# Patient Record
Sex: Female | Born: 1955
Health system: Southern US, Community
[De-identification: ages and names within clinical notes are randomized; demographics above are authoritative.]

## PROBLEM LIST (undated history)

## (undated) DIAGNOSIS — M1990 Unspecified osteoarthritis, unspecified site: Secondary | ICD-10-CM

## (undated) DIAGNOSIS — R12 Heartburn: Secondary | ICD-10-CM

## (undated) DIAGNOSIS — Z9889 Other specified postprocedural states: Secondary | ICD-10-CM

## (undated) DIAGNOSIS — G43909 Migraine, unspecified, not intractable, without status migrainosus: Secondary | ICD-10-CM

## (undated) DIAGNOSIS — F419 Anxiety disorder, unspecified: Secondary | ICD-10-CM

## (undated) DIAGNOSIS — N200 Calculus of kidney: Secondary | ICD-10-CM

## (undated) DIAGNOSIS — K449 Diaphragmatic hernia without obstruction or gangrene: Secondary | ICD-10-CM

## (undated) DIAGNOSIS — N390 Urinary tract infection, site not specified: Secondary | ICD-10-CM

## (undated) DIAGNOSIS — C4491 Basal cell carcinoma of skin, unspecified: Secondary | ICD-10-CM

## (undated) DIAGNOSIS — K5790 Diverticulosis of intestine, part unspecified, without perforation or abscess without bleeding: Secondary | ICD-10-CM

## (undated) DIAGNOSIS — R112 Nausea with vomiting, unspecified: Secondary | ICD-10-CM

## (undated) DIAGNOSIS — Z8619 Personal history of other infectious and parasitic diseases: Secondary | ICD-10-CM

## (undated) DIAGNOSIS — E7800 Pure hypercholesterolemia, unspecified: Secondary | ICD-10-CM

## (undated) DIAGNOSIS — R7303 Prediabetes: Secondary | ICD-10-CM

## (undated) DIAGNOSIS — K219 Gastro-esophageal reflux disease without esophagitis: Secondary | ICD-10-CM

## (undated) DIAGNOSIS — G4700 Insomnia, unspecified: Secondary | ICD-10-CM

## (undated) HISTORY — DX: Personal history of other infectious and parasitic diseases: Z86.19

## (undated) HISTORY — DX: Unspecified osteoarthritis, unspecified site: M19.90

## (undated) HISTORY — DX: Prediabetes: R73.03

## (undated) HISTORY — DX: Gastro-esophageal reflux disease without esophagitis: K21.9

## (undated) HISTORY — DX: Anxiety disorder, unspecified: F41.9

## (undated) HISTORY — PX: CHOLECYSTECTOMY: SHX55

## (undated) HISTORY — DX: Basal cell carcinoma of skin, unspecified: C44.91

## (undated) HISTORY — DX: Migraine, unspecified, not intractable, without status migrainosus: G43.909

## (undated) HISTORY — DX: Pure hypercholesterolemia, unspecified: E78.00

## (undated) HISTORY — DX: Diverticulosis of intestine, part unspecified, without perforation or abscess without bleeding: K57.90

## (undated) HISTORY — DX: Urinary tract infection, site not specified: N39.0

## (undated) HISTORY — DX: Insomnia, unspecified: G47.00

## (undated) HISTORY — DX: Heartburn: R12

## (undated) HISTORY — PX: BASAL CELL CARCINOMA EXCISION: SHX1214

## (undated) HISTORY — DX: Diaphragmatic hernia without obstruction or gangrene: K44.9

## (undated) HISTORY — DX: Calculus of kidney: N20.0

---

## 1986-08-17 HISTORY — PX: ABDOMINAL HYSTERECTOMY: SHX81

## 1986-08-17 HISTORY — PX: APPENDECTOMY: SHX54

## 1988-08-17 HISTORY — PX: BREAST BIOPSY: SHX20

## 1998-08-17 HISTORY — PX: BLADDER REPAIR: SHX76

## 2000-08-03 ENCOUNTER — Ambulatory Visit (HOSPITAL_COMMUNITY): Admission: RE | Admit: 2000-08-03 | Discharge: 2000-08-03 | Payer: Self-pay | Admitting: Gastroenterology

## 2000-08-03 ENCOUNTER — Encounter: Payer: Self-pay | Admitting: Gastroenterology

## 2000-10-13 ENCOUNTER — Ambulatory Visit (HOSPITAL_COMMUNITY): Admission: RE | Admit: 2000-10-13 | Discharge: 2000-10-13 | Payer: Self-pay | Admitting: Gastroenterology

## 2000-10-13 ENCOUNTER — Encounter: Payer: Self-pay | Admitting: Gastroenterology

## 2000-11-04 ENCOUNTER — Ambulatory Visit (HOSPITAL_COMMUNITY): Admission: RE | Admit: 2000-11-04 | Discharge: 2000-11-04 | Payer: Self-pay | Admitting: Gastroenterology

## 2001-02-16 ENCOUNTER — Other Ambulatory Visit: Admission: RE | Admit: 2001-02-16 | Discharge: 2001-02-16 | Payer: Self-pay | Admitting: Obstetrics & Gynecology

## 2005-04-07 ENCOUNTER — Encounter: Admission: RE | Admit: 2005-04-07 | Discharge: 2005-04-07 | Payer: Self-pay | Admitting: Orthopaedic Surgery

## 2005-08-21 ENCOUNTER — Encounter: Admission: RE | Admit: 2005-08-21 | Discharge: 2005-08-21 | Payer: Self-pay | Admitting: Unknown Physician Specialty

## 2014-03-17 LAB — HM MAMMOGRAPHY

## 2014-09-10 ENCOUNTER — Ambulatory Visit: Payer: Self-pay | Admitting: Physician Assistant

## 2014-09-17 ENCOUNTER — Ambulatory Visit (INDEPENDENT_AMBULATORY_CARE_PROVIDER_SITE_OTHER): Payer: No Typology Code available for payment source | Admitting: Physician Assistant

## 2014-09-17 ENCOUNTER — Encounter: Payer: Self-pay | Admitting: Physician Assistant

## 2014-09-17 VITALS — BP 111/76 | HR 75 | Temp 98.2°F | Resp 16 | Ht 63.0 in | Wt 155.5 lb

## 2014-09-17 DIAGNOSIS — G43009 Migraine without aura, not intractable, without status migrainosus: Secondary | ICD-10-CM | POA: Insufficient documentation

## 2014-09-17 DIAGNOSIS — K219 Gastro-esophageal reflux disease without esophagitis: Secondary | ICD-10-CM

## 2014-09-17 DIAGNOSIS — Z23 Encounter for immunization: Secondary | ICD-10-CM

## 2014-09-17 DIAGNOSIS — G4700 Insomnia, unspecified: Secondary | ICD-10-CM | POA: Insufficient documentation

## 2014-09-17 DIAGNOSIS — L500 Allergic urticaria: Secondary | ICD-10-CM

## 2014-09-17 DIAGNOSIS — G479 Sleep disorder, unspecified: Secondary | ICD-10-CM

## 2014-09-17 DIAGNOSIS — Z8669 Personal history of other diseases of the nervous system and sense organs: Secondary | ICD-10-CM | POA: Diagnosis not present

## 2014-09-17 DIAGNOSIS — Z85828 Personal history of other malignant neoplasm of skin: Secondary | ICD-10-CM | POA: Diagnosis not present

## 2014-09-17 MED ORDER — ALPRAZOLAM 0.25 MG PO TABS
0.2500 mg | ORAL_TABLET | Freq: Every evening | ORAL | Status: DC | PRN
Start: 2014-09-17 — End: 2015-03-27

## 2014-09-17 MED ORDER — CHOLECALCIFEROL 10 MCG (400 UNIT) PO TABS
400.0000 [IU] | ORAL_TABLET | Freq: Every day | ORAL | Status: DC
Start: 2014-09-17 — End: 2021-06-20

## 2014-09-17 MED ORDER — SUMATRIPTAN SUCCINATE 100 MG PO TABS: 100.0000 mg | ORAL_TABLET | Freq: Once | ORAL | Status: DC

## 2014-09-17 MED ORDER — RANITIDINE HCL 150 MG PO CAPS: 150.0000 mg | ORAL_CAPSULE | Freq: Every day | ORAL | Status: DC | PRN

## 2014-09-17 NOTE — Progress Notes (Signed)
Pre visit review using our clinic review tool, if applicable. No additional management support is needed unless otherwise documented below in the visit note/SLS  

## 2014-09-17 NOTE — Patient Instructions (Signed)
Please schedule an appointment for a Complete Physical with fasting labs.  Please continue your medications as directed.  Read information below on Preventive Care for adults.  Preventive Care for Adults A healthy lifestyle and preventive care can promote health and wellness. Preventive health guidelines for women include the following key practices.  A routine yearly physical is a good way to check with your health care provider about your health and preventive screening. It is a chance to share any concerns and updates on your health and to receive a thorough exam.  Visit your dentist for a routine exam and preventive care every 6 months. Brush your teeth twice a day and floss once a day. Good oral hygiene prevents tooth decay and gum disease.  The frequency of eye exams is based on your age, health, family medical history, use of contact lenses, and other factors. Follow your health care provider's recommendations for frequency of eye exams.  Eat a healthy diet. Foods like vegetables, fruits, whole grains, low-fat dairy products, and lean protein foods contain the nutrients you need without too many calories. Decrease your intake of foods high in solid fats, added sugars, and salt. Eat the right amount of calories for you.Get information about a proper diet from your health care provider, if necessary.  Regular physical exercise is one of the most important things you can do for your health. Most adults should get at least 150 minutes of moderate-intensity exercise (any activity that increases your heart rate and causes you to sweat) each week. In addition, most adults need muscle-strengthening exercises on 2 or more days a week.  Maintain a healthy weight. The body mass index (BMI) is a screening tool to identify possible weight problems. It provides an estimate of body fat based on height and weight. Your health care provider can find your BMI and can help you achieve or maintain a healthy  weight.For adults 20 years and older:  A BMI below 18.5 is considered underweight.  A BMI of 18.5 to 24.9 is normal.  A BMI of 25 to 29.9 is considered overweight.  A BMI of 30 and above is considered obese.  Maintain normal blood lipids and cholesterol levels by exercising and minimizing your intake of saturated fat. Eat a balanced diet with plenty of fruit and vegetables. Blood tests for lipids and cholesterol should begin at age 52 and be repeated every 5 years. If your lipid or cholesterol levels are high, you are over 50, or you are at high risk for heart disease, you may need your cholesterol levels checked more frequently.Ongoing high lipid and cholesterol levels should be treated with medicines if diet and exercise are not working.  If you smoke, find out from your health care provider how to quit. If you do not use tobacco, do not start.  Lung cancer screening is recommended for adults aged 37-80 years who are at high risk for developing lung cancer because of a history of smoking. A yearly low-dose CT scan of the lungs is recommended for people who have at least a 30-pack-year history of smoking and are a current smoker or have quit within the past 15 years. A pack year of smoking is smoking an average of 1 pack of cigarettes a day for 1 year (for example: 1 pack a day for 30 years or 2 packs a day for 15 years). Yearly screening should continue until the smoker has stopped smoking for at least 15 years. Yearly screening should be stopped for  people who develop a health problem that would prevent them from having lung cancer treatment.  If you are pregnant, do not drink alcohol. If you are breastfeeding, be very cautious about drinking alcohol. If you are not pregnant and choose to drink alcohol, do not have more than 1 drink per day. One drink is considered to be 12 ounces (355 mL) of beer, 5 ounces (148 mL) of wine, or 1.5 ounces (44 mL) of liquor.  Avoid use of street drugs. Do not  share needles with anyone. Ask for help if you need support or instructions about stopping the use of drugs.  High blood pressure causes heart disease and increases the risk of stroke. Your blood pressure should be checked at least every 1 to 2 years. Ongoing high blood pressure should be treated with medicines if weight loss and exercise do not work.  If you are 24-82 years old, ask your health care provider if you should take aspirin to prevent strokes.  Diabetes screening involves taking a blood sample to check your fasting blood sugar level. This should be done once every 3 years, after age 33, if you are within normal weight and without risk factors for diabetes. Testing should be considered at a younger age or be carried out more frequently if you are overweight and have at least 1 risk factor for diabetes.  Breast cancer screening is essential preventive care for women. You should practice "breast self-awareness." This means understanding the normal appearance and feel of your breasts and may include breast self-examination. Any changes detected, no matter how small, should be reported to a health care provider. Women in their 21s and 30s should have a clinical breast exam (CBE) by a health care provider as part of a regular health exam every 1 to 3 years. After age 96, women should have a CBE every year. Starting at age 52, women should consider having a mammogram (breast X-ray test) every year. Women who have a family history of breast cancer should talk to their health care provider about genetic screening. Women at a high risk of breast cancer should talk to their health care providers about having an MRI and a mammogram every year.  Breast cancer gene (BRCA)-related cancer risk assessment is recommended for women who have family members with BRCA-related cancers. BRCA-related cancers include breast, ovarian, tubal, and peritoneal cancers. Having family members with these cancers may be  associated with an increased risk for harmful changes (mutations) in the breast cancer genes BRCA1 and BRCA2. Results of the assessment will determine the need for genetic counseling and BRCA1 and BRCA2 testing.  Routine pelvic exams to screen for cancer are no longer recommended for nonpregnant women who are considered low risk for cancer of the pelvic organs (ovaries, uterus, and vagina) and who do not have symptoms. Ask your health care provider if a screening pelvic exam is right for you.  If you have had past treatment for cervical cancer or a condition that could lead to cancer, you need Pap tests and screening for cancer for at least 20 years after your treatment. If Pap tests have been discontinued, your risk factors (such as having a new sexual partner) need to be reassessed to determine if screening should be resumed. Some women have medical problems that increase the chance of getting cervical cancer. In these cases, your health care provider may recommend more frequent screening and Pap tests.  The HPV test is an additional test that may be used for  cervical cancer screening. The HPV test looks for the virus that can cause the cell changes on the cervix. The cells collected during the Pap test can be tested for HPV. The HPV test could be used to screen women aged 74 years and older, and should be used in women of any age who have unclear Pap test results. After the age of 92, women should have HPV testing at the same frequency as a Pap test.  Colorectal cancer can be detected and often prevented. Most routine colorectal cancer screening begins at the age of 50 years and continues through age 67 years. However, your health care provider may recommend screening at an earlier age if you have risk factors for colon cancer. On a yearly basis, your health care provider may provide home test kits to check for hidden blood in the stool. Use of a small camera at the end of a tube, to directly examine the  colon (sigmoidoscopy or colonoscopy), can detect the earliest forms of colorectal cancer. Talk to your health care provider about this at age 78, when routine screening begins. Direct exam of the colon should be repeated every 5-10 years through age 30 years, unless early forms of pre-cancerous polyps or small growths are found.  People who are at an increased risk for hepatitis B should be screened for this virus. You are considered at high risk for hepatitis B if:  You were born in a country where hepatitis B occurs often. Talk with your health care provider about which countries are considered high risk.  Your parents were born in a high-risk country and you have not received a shot to protect against hepatitis B (hepatitis B vaccine).  You have HIV or AIDS.  You use needles to inject street drugs.  You live with, or have sex with, someone who has hepatitis B.  You get hemodialysis treatment.  You take certain medicines for conditions like cancer, organ transplantation, and autoimmune conditions.  Hepatitis C blood testing is recommended for all people born from [birth year] through 1965 and any individual with known risks for hepatitis C.  Practice safe sex. Use condoms and avoid high-risk sexual practices to reduce the spread of sexually transmitted infections (STIs). STIs include gonorrhea, chlamydia, syphilis, trichomonas, herpes, HPV, and human immunodeficiency virus (HIV). Herpes, HIV, and HPV are viral illnesses that have no cure. They can result in disability, cancer, and death.  You should be screened for sexually transmitted illnesses (STIs) including gonorrhea and chlamydia if:  You are sexually active and are younger than 24 years.  You are older than 24 years and your health care provider tells you that you are at risk for this type of infection.  Your sexual activity has changed since you were last screened and you are at an increased risk for chlamydia or gonorrhea. Ask your  health care provider if you are at risk.  If you are at risk of being infected with HIV, it is recommended that you take a prescription medicine daily to prevent HIV infection. This is called preexposure prophylaxis (PrEP). You are considered at risk if:  You are a heterosexual woman, are sexually active, and are at increased risk for HIV infection.  You take drugs by injection.  You are sexually active with a partner who has HIV.  Talk with your health care provider about whether you are at high risk of being infected with HIV. If you choose to begin PrEP, you should first be tested for HIV. You  should then be tested every 3 months for as long as you are taking PrEP.  Osteoporosis is a disease in which the bones lose minerals and strength with aging. This can result in serious bone fractures or breaks. The risk of osteoporosis can be identified using a bone density scan. Women ages 43 years and over and women at risk for fractures or osteoporosis should discuss screening with their health care providers. Ask your health care provider whether you should take a calcium supplement or vitamin D to reduce the rate of osteoporosis.  Menopause can be associated with physical symptoms and risks. Hormone replacement therapy is available to decrease symptoms and risks. You should talk to your health care provider about whether hormone replacement therapy is right for you.  Use sunscreen. Apply sunscreen liberally and repeatedly throughout the day. You should seek shade when your shadow is shorter than you. Protect yourself by wearing long sleeves, pants, a wide-brimmed hat, and sunglasses year round, whenever you are outdoors.  Once a month, do a whole body skin exam, using a mirror to look at the skin on your back. Tell your health care provider of new moles, moles that have irregular borders, moles that are larger than a pencil eraser, or moles that have changed in shape or color.  Stay current with  required vaccines (immunizations).  Influenza vaccine. All adults should be immunized every year.  Tetanus, diphtheria, and acellular pertussis (Td, Tdap) vaccine. Pregnant women should receive 1 dose of Tdap vaccine during each pregnancy. The dose should be obtained regardless of the length of time since the last dose. Immunization is preferred during the 27th-36th week of gestation. An adult who has not previously received Tdap or who does not know her vaccine status should receive 1 dose of Tdap. This initial dose should be followed by tetanus and diphtheria toxoids (Td) booster doses every 10 years. Adults with an unknown or incomplete history of completing a 3-dose immunization series with Td-containing vaccines should begin or complete a primary immunization series including a Tdap dose. Adults should receive a Td booster every 10 years.  Varicella vaccine. An adult without evidence of immunity to varicella should receive 2 doses or a second dose if she has previously received 1 dose. Pregnant females who do not have evidence of immunity should receive the first dose after pregnancy. This first dose should be obtained before leaving the health care facility. The second dose should be obtained 4-8 weeks after the first dose.  Human papillomavirus (HPV) vaccine. Females aged 13-26 years who have not received the vaccine previously should obtain the 3-dose series. The vaccine is not recommended for use in pregnant females. However, pregnancy testing is not needed before receiving a dose. If a female is found to be pregnant after receiving a dose, no treatment is needed. In that case, the remaining doses should be delayed until after the pregnancy. Immunization is recommended for any person with an immunocompromised condition through the age of 83 years if she did not get any or all doses earlier. During the 3-dose series, the second dose should be obtained 4-8 weeks after the first dose. The third dose  should be obtained 24 weeks after the first dose and 16 weeks after the second dose.  Zoster vaccine. One dose is recommended for adults aged 19 years or older unless certain conditions are present.  Measles, mumps, and rubella (MMR) vaccine. Adults born before 25 generally are considered immune to measles and mumps. Adults born in  1957 or later should have 1 or more doses of MMR vaccine unless there is a contraindication to the vaccine or there is laboratory evidence of immunity to each of the three diseases. A routine second dose of MMR vaccine should be obtained at least 28 days after the first dose for students attending postsecondary schools, health care workers, or international travelers. People who received inactivated measles vaccine or an unknown type of measles vaccine during 1963-1967 should receive 2 doses of MMR vaccine. People who received inactivated mumps vaccine or an unknown type of mumps vaccine before 1979 and are at high risk for mumps infection should consider immunization with 2 doses of MMR vaccine. For females of childbearing age, rubella immunity should be determined. If there is no evidence of immunity, females who are not pregnant should be vaccinated. If there is no evidence of immunity, females who are pregnant should delay immunization until after pregnancy. Unvaccinated health care workers born before [birth year] who lack laboratory evidence of measles, mumps, or rubella immunity or laboratory confirmation of disease should consider measles and mumps immunization with 2 doses of MMR vaccine or rubella immunization with 1 dose of MMR vaccine.  Pneumococcal 13-valent conjugate (PCV13) vaccine. When indicated, a person who is uncertain of her immunization history and has no record of immunization should receive the PCV13 vaccine. An adult aged 39 years or older who has certain medical conditions and has not been previously immunized should receive 1 dose of PCV13 vaccine. This PCV13  should be followed with a dose of pneumococcal polysaccharide (PPSV23) vaccine. The PPSV23 vaccine dose should be obtained at least 8 weeks after the dose of PCV13 vaccine. An adult aged 32 years or older who has certain medical conditions and previously received 1 or more doses of PPSV23 vaccine should receive 1 dose of PCV13. The PCV13 vaccine dose should be obtained 1 or more years after the last PPSV23 vaccine dose.  Pneumococcal polysaccharide (PPSV23) vaccine. When PCV13 is also indicated, PCV13 should be obtained first. All adults aged 57 years and older should be immunized. An adult younger than age 19 years who has certain medical conditions should be immunized. Any person who resides in a nursing home or long-term care facility should be immunized. An adult smoker should be immunized. People with an immunocompromised condition and certain other conditions should receive both PCV13 and PPSV23 vaccines. People with human immunodeficiency virus (HIV) infection should be immunized as soon as possible after diagnosis. Immunization during chemotherapy or radiation therapy should be avoided. Routine use of PPSV23 vaccine is not recommended for American Indians, [place] Natives, or people younger than 65 years unless there are medical conditions that require PPSV23 vaccine. When indicated, people who have unknown immunization and have no record of immunization should receive PPSV23 vaccine. One-time revaccination 5 years after the first dose of PPSV23 is recommended for people aged 19-64 years who have chronic kidney failure, nephrotic syndrome, asplenia, or immunocompromised conditions. People who received 1-2 doses of PPSV23 before age 67 years should receive another dose of PPSV23 vaccine at age 57 years or later if at least 5 years have passed since the previous dose. Doses of PPSV23 are not needed for people immunized with PPSV23 at or after age 59 years.  Meningococcal vaccine. Adults with asplenia or  persistent complement component deficiencies should receive 2 doses of quadrivalent meningococcal conjugate (MenACWY-D) vaccine. The doses should be obtained at least 2 months apart. Microbiologists working with certain meningococcal bacteria, [employer] recruits, people at risk  during an outbreak, and people who travel to or live in countries with a high rate of meningitis should be immunized. A first-year college student up through age 58 years who is living in a residence hall should receive a dose if she did not receive a dose on or after her 16th birthday. Adults who have certain high-risk conditions should receive one or more doses of vaccine.  Hepatitis A vaccine. Adults who wish to be protected from this disease, have certain high-risk conditions, work with hepatitis A-infected animals, work in hepatitis A research labs, or travel to or work in countries with a high rate of hepatitis A should be immunized. Adults who were previously unvaccinated and who anticipate close contact with an international adoptee during the first 60 days after arrival in the [place] from a country with a high rate of hepatitis A should be immunized.  Hepatitis B vaccine. Adults who wish to be protected from this disease, have certain high-risk conditions, may be exposed to blood or other infectious body fluids, are household contacts or sex partners of hepatitis B positive people, are clients or workers in certain care facilities, or travel to or work in countries with a high rate of hepatitis B should be immunized.  Haemophilus influenzae type b (Hib) vaccine. A previously unvaccinated person with asplenia or sickle cell disease or having a scheduled splenectomy should receive 1 dose of Hib vaccine. Regardless of previous immunization, a recipient of a hematopoietic stem cell transplant should receive a 3-dose series 6-12 months after her successful transplant. Hib vaccine is not recommended for adults with HIV  infection. Preventive Services / Frequency Ages 68 to 27 years  Blood pressure check.** / Every 1 to 2 years.  Lipid and cholesterol check.** / Every 5 years beginning at age 26.  Clinical breast exam.** / Every 3 years for women in their 49s and 21s.  BRCA-related cancer risk assessment.** / For women who have family members with a BRCA-related cancer (breast, ovarian, tubal, or peritoneal cancers).  Pap test.** / Every 2 years from ages 7 through 20. Every 3 years starting at age 1 through age 88 or 31 with a history of 3 consecutive normal Pap tests.  HPV screening.** / Every 3 years from ages 39 through ages 49 to 33 with a history of 3 consecutive normal Pap tests.  Hepatitis C blood test.** / For any individual with known risks for hepatitis C.  Skin self-exam. / Monthly.  Influenza vaccine. / Every year.  Tetanus, diphtheria, and acellular pertussis (Tdap, Td) vaccine.** / Consult your health care provider. Pregnant women should receive 1 dose of Tdap vaccine during each pregnancy. 1 dose of Td every 10 years.  Varicella vaccine.** / Consult your health care provider. Pregnant females who do not have evidence of immunity should receive the first dose after pregnancy.  HPV vaccine. / 3 doses over 6 months, if 60 and younger. The vaccine is not recommended for use in pregnant females. However, pregnancy testing is not needed before receiving a dose.  Measles, mumps, rubella (MMR) vaccine.** / You need at least 1 dose of MMR if you were born in 1957 or later. You may also need a 2nd dose. For females of childbearing age, rubella immunity should be determined. If there is no evidence of immunity, females who are not pregnant should be vaccinated. If there is no evidence of immunity, females who are pregnant should delay immunization until after pregnancy.  Pneumococcal 13-valent conjugate (PCV13) vaccine.** /  Consult your health care provider.  Pneumococcal polysaccharide  (PPSV23) vaccine.** / 1 to 2 doses if you smoke cigarettes or if you have certain conditions.  Meningococcal vaccine.** / 1 dose if you are age 85 to 57 years and a Market researcher living in a residence hall, or have one of several medical conditions, you need to get vaccinated against meningococcal disease. You may also need additional booster doses.  Hepatitis A vaccine.** / Consult your health care provider.  Hepatitis B vaccine.** / Consult your health care provider.  Haemophilus influenzae type b (Hib) vaccine.** / Consult your health care provider. Ages 22 to 33 years  Blood pressure check.** / Every 1 to 2 years.  Lipid and cholesterol check.** / Every 5 years beginning at age 47 years.  Lung cancer screening. / Every year if you are aged 27-80 years and have a 30-pack-year history of smoking and currently smoke or have quit within the past 15 years. Yearly screening is stopped once you have quit smoking for at least 15 years or develop a health problem that would prevent you from having lung cancer treatment.  Clinical breast exam.** / Every year after age 60 years.  BRCA-related cancer risk assessment.** / For women who have family members with a BRCA-related cancer (breast, ovarian, tubal, or peritoneal cancers).  Mammogram.** / Every year beginning at age 41 years and continuing for as long as you are in good health. Consult with your health care provider.  Pap test.** / Every 3 years starting at age 24 years through age 71 or 27 years with a history of 3 consecutive normal Pap tests.  HPV screening.** / Every 3 years from ages 4 years through ages 85 to 59 years with a history of 3 consecutive normal Pap tests.  Fecal occult blood test (FOBT) of stool. / Every year beginning at age 13 years and continuing until age 47 years. You may not need to do this test if you get a colonoscopy every 10 years.  Flexible sigmoidoscopy or colonoscopy.** / Every 5 years for a  flexible sigmoidoscopy or every 10 years for a colonoscopy beginning at age 3 years and continuing until age 38 years.  Hepatitis C blood test.** / For all people born from [birth year] through 1965 and any individual with known risks for hepatitis C.  Skin self-exam. / Monthly.  Influenza vaccine. / Every year.  Tetanus, diphtheria, and acellular pertussis (Tdap/Td) vaccine.** / Consult your health care provider. Pregnant women should receive 1 dose of Tdap vaccine during each pregnancy. 1 dose of Td every 10 years.  Varicella vaccine.** / Consult your health care provider. Pregnant females who do not have evidence of immunity should receive the first dose after pregnancy.  Zoster vaccine.** / 1 dose for adults aged 22 years or older.  Measles, mumps, rubella (MMR) vaccine.** / You need at least 1 dose of MMR if you were born in 1957 or later. You may also need a 2nd dose. For females of childbearing age, rubella immunity should be determined. If there is no evidence of immunity, females who are not pregnant should be vaccinated. If there is no evidence of immunity, females who are pregnant should delay immunization until after pregnancy.  Pneumococcal 13-valent conjugate (PCV13) vaccine.** / Consult your health care provider.  Pneumococcal polysaccharide (PPSV23) vaccine.** / 1 to 2 doses if you smoke cigarettes or if you have certain conditions.  Meningococcal vaccine.** / Consult your health care provider.  Hepatitis A vaccine.** / Consult  your health care provider.  Hepatitis B vaccine.** / Consult your health care provider.  Haemophilus influenzae type b (Hib) vaccine.** / Consult your health care provider. Ages 76 years and over  Blood pressure check.** / Every 1 to 2 years.  Lipid and cholesterol check.** / Every 5 years beginning at age 62 years.  Lung cancer screening. / Every year if you are aged 34-80 years and have a 30-pack-year history of smoking and currently smoke or have  quit within the past 15 years. Yearly screening is stopped once you have quit smoking for at least 15 years or develop a health problem that would prevent you from having lung cancer treatment.  Clinical breast exam.** / Every year after age 12 years.  BRCA-related cancer risk assessment.** / For women who have family members with a BRCA-related cancer (breast, ovarian, tubal, or peritoneal cancers).  Mammogram.** / Every year beginning at age 65 years and continuing for as long as you are in good health. Consult with your health care provider.  Pap test.** / Every 3 years starting at age 75 years through age 39 or 25 years with 3 consecutive normal Pap tests. Testing can be stopped between 65 and 70 years with 3 consecutive normal Pap tests and no abnormal Pap or HPV tests in the past 10 years.  HPV screening.** / Every 3 years from ages 12 years through ages 52 or 87 years with a history of 3 consecutive normal Pap tests. Testing can be stopped between 65 and 70 years with 3 consecutive normal Pap tests and no abnormal Pap or HPV tests in the past 10 years.  Fecal occult blood test (FOBT) of stool. / Every year beginning at age 93 years and continuing until age 49 years. You may not need to do this test if you get a colonoscopy every 10 years.  Flexible sigmoidoscopy or colonoscopy.** / Every 5 years for a flexible sigmoidoscopy or every 10 years for a colonoscopy beginning at age 70 years and continuing until age 4 years.  Hepatitis C blood test.** / For all people born from [birth year] through 1965 and any individual with known risks for hepatitis C.  Osteoporosis screening.** / A one-time screening for women ages 10 years and over and women at risk for fractures or osteoporosis.  Skin self-exam. / Monthly.  Influenza vaccine. / Every year.  Tetanus, diphtheria, and acellular pertussis (Tdap/Td) vaccine.** / 1 dose of Td every 10 years.  Varicella vaccine.** / Consult your health care  provider.  Zoster vaccine.** / 1 dose for adults aged 38 years or older.  Pneumococcal 13-valent conjugate (PCV13) vaccine.** / Consult your health care provider.  Pneumococcal polysaccharide (PPSV23) vaccine.** / 1 dose for all adults aged 44 years and older.  Meningococcal vaccine.** / Consult your health care provider.  Hepatitis A vaccine.** / Consult your health care provider.  Hepatitis B vaccine.** / Consult your health care provider.  Haemophilus influenzae type b (Hib) vaccine.** / Consult your health care provider. ** Family history and personal history of risk and conditions may change your health care provider's recommendations. Document Released: 09/29/2001 Document Revised: 12/18/2013 Document Reviewed: 12/29/2010 [facility] Patient Information 2015 [place], [place]. This information is not intended to replace advice given to you by your health care provider. Make sure you discuss any questions you have with your health care provider.

## 2014-09-17 NOTE — Progress Notes (Signed)
Patient presents to clinic today to establish care.  Chronic Issues: Migraine Headaches -- Uses Imitrex prn with good relief.  Usually can control headache with Advil.  Endorses migraine every few weeks.  Insomnia -- Occasional difficulty remaining asleep. Good relief of this with Xanax 0.25 mg.  Is having to use medication once monthly.  GERD -- endorses relief with evening Zantac.  Denies nausea/vomiting, epigastric pain or change to bowel movements.  Hx of Basal Cell Carcinoma -- Requesting referral to Dermatology (Dr. Ubaldo Glassing) for insurance purposes.  Allergic Urticaria -- history of such.  Keeps Claritin on hand in case of a flare-up.  No cause has been found even with allergist workup.  Health Maintenance: Immunizations -- Flu shot given today.  Up-to-date on Tetanus. Colonoscopy --  Mammogram -- Endorses last Mammogram August 2015 without abnormal findings. PAP -- Endorses due in April this year.  Wishes to see OB/GYN.   Past Medical History  Diagnosis Date  . Migraine headache   . Insomnia   . History of chicken pox   . Osteoarthritis     Lumbar spine; hands; feet  . GERD without esophagitis   . Heartburn   . Elevated cholesterol   . UTI (lower urinary tract infection)   . Basal cell adenocarcinoma     Past Surgical History  Procedure Laterality Date  . Bladder repair  2000    Mesh  . Breast biopsy  1990  . Appendectomy  1988  . Abdominal hysterectomy  1988  . Basal cell carcinoma excision      Multiple    No current outpatient prescriptions on file prior to visit.   No current facility-administered medications on file prior to visit.    No Known Allergies  Family History  Problem Relation Age of Onset  . Arthritis-Osteo Mother     Living  . Arthritis-Osteo Father     Living  . Diabetes Mother   . Diabetes Father   . Hyperlipidemia Mother   . Hypertension Mother   . Hypertension Father   . Heart attack Maternal Grandfather   . Heart attack  Maternal Grandmother   . Alzheimer's disease Maternal Grandmother   . Pancreatic cancer Paternal Grandfather   . Dementia Paternal Grandmother   . Breast cancer Maternal Aunt   . Healthy Brother     x1  . Healthy Sister     x1  . Healthy Son     x2    History   Social History  . Marital Status: Married    Spouse Name: N/A    Number of Children: N/A  . Years of Education: N/A   Occupational History  . Not on file.   Social History Main Topics  . Smoking status: Never Smoker   . Smokeless tobacco: Not on file  . Alcohol Use: Not on file  . Drug Use: Not on file  . Sexual Activity: Not on file   Other Topics Concern  . Not on file   Social History Narrative   [place].   ROS Pertinent ROS are listed in the HPI.  BP 111/76 mmHg  Pulse 75  Temp(Src) 98.2 F (36.8 C) (Oral)  Resp 16  Ht 5\' 3"  (1.6 m)  Wt 155 lb 8 oz (70.534 kg)  BMI 27.55 kg/m2  SpO2 99%  Physical Exam  Constitutional: She is oriented to person, place, and time and well-developed, well-nourished, and in no distress.  HENT:  Head: Normocephalic and atraumatic.  Eyes: Conjunctivae are  normal. Pupils are equal, round, and reactive to light.  Neck: Neck supple. No thyromegaly present.  Cardiovascular: Normal rate, regular rhythm, normal heart sounds and intact distal pulses.   Pulmonary/Chest: Effort normal and breath sounds normal. No respiratory distress. She has no wheezes. She has no rales. She exhibits no tenderness.  Abdominal: Soft. Bowel sounds are normal. She exhibits no distension. There is no tenderness.  Lymphadenopathy:    She has no cervical adenopathy.  Neurological: She is alert and oriented to person, place, and time.  Skin: Skin is warm and dry. No rash noted.  Psychiatric: Affect normal.  Vitals reviewed.   Assessment/Plan: Gastroesophageal reflux disease without esophagitis Well-controlled at present.  Will continue current regimen.  Medications refilled.  GERD  diet discussed.   Allergic urticaria No flare-up is quite some time.  Flare-ups well controlled with Claritin.  Has had previous workup without finding cause. No further workup indicated at present.   Hx of basal cell carcinoma Referral back to her Dermatologist placed for insurance purposes.   Sleeping difficulties Mild.  Xanax refilled.  Follow-up in 6 months.

## 2014-09-24 NOTE — Assessment & Plan Note (Signed)
Mild.  Xanax refilled.  Follow-up in 6 months.

## 2014-09-24 NOTE — Assessment & Plan Note (Signed)
Referral back to her Dermatologist placed for insurance purposes.

## 2014-09-24 NOTE — Assessment & Plan Note (Signed)
>>  ASSESSMENT AND PLAN FOR SLEEPING DIFFICULTIES WRITTEN ON 09/24/2014  7:57 AM BY Marcelline MatesMARTIN, WILLIAM C, PA-C  Mild.  Xanax refilled.  Follow-up in 6 months.

## 2014-09-24 NOTE — Assessment & Plan Note (Signed)
Well-controlled at present.  Will continue current regimen.  Medications refilled.  GERD diet discussed.

## 2014-09-24 NOTE — Assessment & Plan Note (Signed)
No flare-up is quite some time.  Flare-ups well controlled with Claritin.  Has had previous workup without finding cause. No further workup indicated at present.

## 2014-10-29 ENCOUNTER — Encounter: Payer: Self-pay | Admitting: *Deleted

## 2014-10-29 ENCOUNTER — Telehealth: Payer: Self-pay | Admitting: *Deleted

## 2014-10-29 NOTE — Telephone Encounter (Signed)
Pre-Visit Call completed with patient and chart updated.   Pre-Visit Info documented in Specialty Comments under [employer].    

## 2014-10-30 ENCOUNTER — Encounter: Payer: Self-pay | Admitting: Physician Assistant

## 2014-10-30 ENCOUNTER — Ambulatory Visit (INDEPENDENT_AMBULATORY_CARE_PROVIDER_SITE_OTHER): Payer: No Typology Code available for payment source | Admitting: Physician Assistant

## 2014-10-30 VITALS — BP 98/64 | HR 58 | Temp 97.9°F | Resp 16 | Ht 63.0 in | Wt 155.5 lb

## 2014-10-30 DIAGNOSIS — M26629 Arthralgia of temporomandibular joint, unspecified side: Secondary | ICD-10-CM

## 2014-10-30 DIAGNOSIS — Z0000 Encounter for general adult medical examination without abnormal findings: Secondary | ICD-10-CM

## 2014-10-30 DIAGNOSIS — R7989 Other specified abnormal findings of blood chemistry: Secondary | ICD-10-CM

## 2014-10-30 DIAGNOSIS — M2669 Other specified disorders of temporomandibular joint: Secondary | ICD-10-CM

## 2014-10-30 DIAGNOSIS — Z1211 Encounter for screening for malignant neoplasm of colon: Secondary | ICD-10-CM

## 2014-10-30 LAB — BASIC METABOLIC PANEL
BUN: 17 mg/dL (ref 6–23)
CALCIUM: 9.8 mg/dL (ref 8.4–10.5)
CO2: 33 meq/L — AB (ref 19–32)
CREATININE: 0.93 mg/dL (ref 0.40–1.20)
Chloride: 102 mEq/L (ref 96–112)
GFR: 65.69 mL/min (ref >60.00)
Glucose, Bld: 96 mg/dL (ref 70–99)
Potassium: 3.8 mEq/L (ref 3.5–5.1)
SODIUM: 137 meq/L (ref 135–145)

## 2014-10-30 LAB — URINALYSIS, ROUTINE W REFLEX MICROSCOPIC
Bilirubin Urine: NEGATIVE
HGB URINE DIPSTICK: NEGATIVE
Ketones, ur: NEGATIVE
Leukocytes, UA: NEGATIVE
NITRITE: NEGATIVE
Specific Gravity, Urine: 1.02 (ref 1.000–1.030)
Total Protein, Urine: NEGATIVE
UROBILINOGEN UA: 0.2 (ref 0.0–1.0)
Urine Glucose: NEGATIVE
pH: 6.5 (ref 5.0–8.0)

## 2014-10-30 LAB — LIPID PANEL
CHOL/HDL RATIO: 4
Cholesterol: 278 mg/dL — ABNORMAL HIGH (ref 0–200)
HDL: 69.2 mg/dL (ref >39.00)
NonHDL: 208.8
TRIGLYCERIDES: 244 mg/dL — AB (ref 0.0–149.0)
VLDL: 48.8 mg/dL — AB (ref 0.0–40.0)

## 2014-10-30 LAB — CBC
HCT: 43.7 % (ref 36.0–46.0)
Hemoglobin: 14.9 g/dL (ref 12.0–15.0)
MCHC: 34.1 g/dL (ref 30.0–36.0)
MCV: 86.2 fl (ref 78.0–100.0)
Platelets: 308 10*3/uL (ref 150.0–400.0)
RBC: 5.06 Mil/uL (ref 3.87–5.11)
RDW: 13.3 % (ref 11.5–15.5)
WBC: 5.2 10*3/uL (ref 4.0–10.5)

## 2014-10-30 LAB — HEPATIC FUNCTION PANEL
ALK PHOS: 58 U/L (ref 39–117)
ALT: 21 U/L (ref 0–35)
AST: 21 U/L (ref 0–37)
Albumin: 4.4 g/dL (ref 3.5–5.2)
BILIRUBIN TOTAL: 0.5 mg/dL (ref 0.2–1.2)
Bilirubin, Direct: 0.1 mg/dL (ref 0.0–0.3)
Total Protein: 7.5 g/dL (ref 6.0–8.3)

## 2014-10-30 LAB — TSH: TSH: 1.52 u[IU]/mL (ref 0.35–4.50)

## 2014-10-30 LAB — HEMOGLOBIN A1C: Hgb A1c MFr Bld: 5.8 % (ref 4.6–6.5)

## 2014-10-30 LAB — LDL CHOLESTEROL, DIRECT: Direct LDL: 183 mg/dL

## 2014-10-30 MED ORDER — MELOXICAM 15 MG PO TABS: 15.0000 mg | ORAL_TABLET | Freq: Every day | ORAL | Status: DC

## 2014-10-30 NOTE — Assessment & Plan Note (Signed)
I have reviewed the patient's medical history in detail and updated the computerized patient record.  Health Maintenance up to date with exception of colonoscopy.  Referral placed.  Declines Hep C screening. PHQ-2 screen performed with score of 0. Preventive care discussed with patient.  Handout given.  Will obtain fasting labs today.

## 2014-10-30 NOTE — Progress Notes (Signed)
Pre visit review using our clinic review tool, if applicable. No additional management support is needed unless otherwise documented below in the visit note/SLS  

## 2014-10-30 NOTE — Progress Notes (Signed)
Patient presents to clinic today for annual exam.  Patient is fasting for labs.  Acute Concerns: Patient c/o R sided TMJ pain over the past few weeks with popping and clicking noted with ROM of mandible. Denies ear drainage, change in hearing or tinnitus.  Denies dental pain.  Has upcoming appointment with dentist.  Health Maintenance: Dental -- up-to-date Vision -- up-to-date Immunizations -- up-to-date Colonoscopy -- due.  Will place referral. Mammogram -- up-to-date PAP -- s/p hysterectomy without hx of cervical dysplasia.  Past Medical History  Diagnosis Date  . Migraine headache   . Insomnia   . History of chicken pox   . Osteoarthritis     Lumbar spine; hands; feet  . GERD without esophagitis   . Heartburn   . Elevated cholesterol   . UTI (lower urinary tract infection)   . Basal cell adenocarcinoma     Past Surgical History  Procedure Laterality Date  . Bladder repair  2000    Mesh  . Breast biopsy  1990  . Appendectomy  1988  . Abdominal hysterectomy  1988  . Basal cell carcinoma excision      Multiple    Current Outpatient Prescriptions on File Prior to Visit  Medication Sig Dispense Refill  . ALPRAZolam (XANAX) 0.25 MG tablet Take 1 tablet (0.25 mg total) by mouth at bedtime as needed for anxiety. 30 tablet 2  . Ascorbic Acid (VITAMIN C) 100 MG tablet Take 100 mg by mouth daily.    . B Complex-C (SUPER B COMPLEX/VITAMIN C PO) Take by mouth daily.    . Biotin 1 MG CAPS Take by mouth daily.    . cetirizine (ZYRTEC) 10 MG chewable tablet Chew 10 mg by mouth daily as needed for allergies.    . cholecalciferol (VITAMIN D) 400 UNITS TABS tablet Take 1 tablet (400 Units total) by mouth daily. 90 tablet 1  . Estradiol Acetate 0.05 MG/24HR RING Place vaginally every 3 (three) months.    Marland Kitchen glucosamine-chondroitin 500-400 MG tablet Take 1 tablet by mouth daily.    . Nutritional Supplements (JUICE PLUS FIBRE) LIQD Take by mouth daily.    . ranitidine (ZANTAC) 150  MG capsule Take 1 capsule (150 mg total) by mouth daily as needed for heartburn. 30 capsule 3  . SUMAtriptan (IMITREX) 100 MG tablet Take 1 tablet (100 mg total) by mouth once. May repeat in 2 hours if headache persists or recurs. 10 tablet 1   No current facility-administered medications on file prior to visit.    No Known Allergies  Family History  Problem Relation Age of Onset  . Arthritis-Osteo Mother     Living  . Arthritis-Osteo Father     Living  . Diabetes Mother   . Diabetes Father   . Hyperlipidemia Mother   . Hypertension Mother   . Hypertension Father   . Heart attack Maternal Grandfather   . Heart attack Maternal Grandmother   . Alzheimer's disease Maternal Grandmother   . Pancreatic cancer Paternal Grandfather   . Dementia Paternal Grandmother   . Breast cancer Maternal Aunt   . Healthy Brother     x1  . Healthy Sister     x1  . Healthy Son     x2    History   Social History  . Marital Status: Married    Spouse Name: N/A  . Number of Children: N/A  . Years of Education: N/A   Occupational History  . Not on file.  Social History Main Topics  . Smoking status: Never Smoker   . Smokeless tobacco: Not on file  . Alcohol Use: Not on file  . Drug Use: Not on file  . Sexual Activity: Not on file   Other Topics Concern  . Not on file   Social History Narrative   [place].   Review of Systems  Constitutional: Negative for fever and weight loss.  HENT: Negative for ear discharge, ear pain, hearing loss and tinnitus.   Eyes: Negative for blurred vision, double vision, photophobia and pain.  Respiratory: Negative for cough and shortness of breath.   Cardiovascular: Negative for chest pain and palpitations.  Gastrointestinal: Negative for heartburn, nausea, vomiting, abdominal pain, diarrhea, constipation, blood in stool and melena.  Genitourinary: Negative for dysuria, urgency, frequency, hematuria and flank pain.  Musculoskeletal: Negative  for falls.  Neurological: Negative for dizziness, loss of consciousness and headaches.  Endo/Heme/Allergies: Negative for environmental allergies.  Psychiatric/Behavioral: Negative for depression, suicidal ideas, hallucinations and substance abuse. The patient is not nervous/anxious and does not have insomnia.    BP 98/64 mmHg  Pulse 58  Temp(Src) 97.9 F (36.6 C) (Oral)  Resp 16  Ht 5\' 3"  (1.6 m)  Wt 155 lb 8 oz (70.534 kg)  BMI 27.55 kg/m2  SpO2 100%  Physical Exam  Constitutional: She is oriented to person, place, and time and well-developed, well-nourished, and in no distress.  HENT:  Head: Normocephalic and atraumatic.  Right Ear: External ear normal.  Left Ear: External ear normal.  Nose: Nose normal.  Mouth/Throat: Oropharynx is clear and moist. No oropharyngeal exudate.  Pain and clicking at R TMJ with ROM.  No evidence of dental infection on oropharyngeal examination.  Eyes: Conjunctivae are normal. Pupils are equal, round, and reactive to light.  Neck: Normal range of motion. Neck supple. No thyromegaly present.  Cardiovascular: Normal rate, regular rhythm, normal heart sounds and intact distal pulses.   Pulmonary/Chest: Effort normal and breath sounds normal. No respiratory distress. She has no wheezes. She has no rales. She exhibits no tenderness.  Abdominal: Soft. Bowel sounds are normal. She exhibits no distension and no mass. There is no tenderness. There is no rebound and no guarding.  Musculoskeletal: Normal range of motion.  Lymphadenopathy:    She has no cervical adenopathy.  Neurological: She is alert and oriented to person, place, and time. No cranial nerve deficit.  Skin: Skin is warm and dry. No rash noted.  Psychiatric: Affect normal.  Vitals reviewed.  Assessment/Plan: Colon cancer screening Hx of polyps on last scope 6 years ago.  Was due for repeat in 2015.  Referral placed to GI for colon cancer screening.   TMJ pain dysfunction syndrome Rx Mobic  daily with food x 2 weeks. Topical Aspercreme. Ice to area.  Other supportive measures discussed.  Follow-up if not improving.   Visit for preventive health examination I have reviewed the patient's medical history in detail and updated the computerized patient record.  Health Maintenance up to date with exception of colonoscopy.  Referral placed.  Declines Hep C screening. PHQ-2 screen performed with score of 0. Preventive care discussed with patient.  Handout given.  Will obtain fasting labs today.

## 2014-10-30 NOTE — Assessment & Plan Note (Signed)
Hx of polyps on last scope 6 years ago.  Was due for repeat in 2015.  Referral placed to GI for colon cancer screening.

## 2014-10-30 NOTE — Assessment & Plan Note (Signed)
Rx Mobic daily with food x 2 weeks. Topical Aspercreme. Ice to area.  Other supportive measures discussed.  Follow-up if not improving.

## 2014-10-30 NOTE — Patient Instructions (Signed)
Please go to the lab for blood work. I will call you with your results.  For the TMJ pain -- read information below.  Take Mobic daily as directed with food for 2 weeks.  Apply topical Aspercreme to the area. Ice packs work well too. Let me know if things are not improving.  Temporomandibular Problems  Temporomandibular joint (TMJ) dysfunction means there are problems with the joint between your jaw and your skull. This is a joint lined by cartilage like other joints in your body but also has a small disc in the joint which keeps the bones from rubbing on each other. These joints are like other joints and can get inflamed (sore) from arthritis and other problems. When this joint gets sore, it can cause headaches and pain in the jaw and the face. CAUSES  Usually the arthritic types of problems are caused by soreness in the joint. Soreness in the joint can also be caused by overuse. This may come from grinding your teeth. It may also come from mis-alignment in the joint. DIAGNOSIS Diagnosis of this condition can often be made by history and exam. Sometimes your caregiver may need X-rays or an MRI scan to determine the exact cause. It may be necessary to see your dentist to determine if your teeth and jaws are lined up correctly. TREATMENT  Most of the time this problem is not serious; however, sometimes it can persist (become chronic). When this happens medications that will cut down on inflammation (soreness) help. Sometimes a shot of cortisone into the joint will be helpful. If your teeth are not aligned it may help for your dentist to make a splint for your mouth that can help this problem. If no physical problems can be found, the problem may come from tension. If tension is found to be the cause, biofeedback or relaxation techniques may be helpful. HOME CARE INSTRUCTIONS   Later in the day, applications of ice packs may be helpful. Ice can be used in a plastic bag with a towel around it to prevent  frostbite to skin. This may be used about every 2 hours for 20 to 30 minutes, as needed while awake, or as directed by your caregiver.  Only take over-the-counter or prescription medicines for pain, discomfort, or fever as directed by your caregiver.  If physical therapy was prescribed, follow your caregiver's directions.  Wear mouth appliances as directed if they were given. Document Released: 04/28/2001 Document Revised: 10/26/2011 Document Reviewed: 08/05/2008 [facility] Patient Information 2015 [place], [place]. This information is not intended to replace advice given to you by your health care provider. Make sure you discuss any questions you have with your health care provider.  Preventive Care for Adults A healthy lifestyle and preventive care can promote health and wellness. Preventive health guidelines for women include the following key practices.  A routine yearly physical is a good way to check with your health care provider about your health and preventive screening. It is a chance to share any concerns and updates on your health and to receive a thorough exam.  Visit your dentist for a routine exam and preventive care every 6 months. Brush your teeth twice a day and floss once a day. Good oral hygiene prevents tooth decay and gum disease.  The frequency of eye exams is based on your age, health, family medical history, use of contact lenses, and other factors. Follow your health care provider's recommendations for frequency of eye exams.  Eat a healthy diet. Foods  like vegetables, fruits, whole grains, low-fat dairy products, and lean protein foods contain the nutrients you need without too many calories. Decrease your intake of foods high in solid fats, added sugars, and salt. Eat the right amount of calories for you.Get information about a proper diet from your health care provider, if necessary.  Regular physical exercise is one of the most important things you can do for your  health. Most adults should get at least 150 minutes of moderate-intensity exercise (any activity that increases your heart rate and causes you to sweat) each week. In addition, most adults need muscle-strengthening exercises on 2 or more days a week.  Maintain a healthy weight. The body mass index (BMI) is a screening tool to identify possible weight problems. It provides an estimate of body fat based on height and weight. Your health care provider can find your BMI and can help you achieve or maintain a healthy weight.For adults 20 years and older:  A BMI below 18.5 is considered underweight.  A BMI of 18.5 to 24.9 is normal.  A BMI of 25 to 29.9 is considered overweight.  A BMI of 30 and above is considered obese.  Maintain normal blood lipids and cholesterol levels by exercising and minimizing your intake of saturated fat. Eat a balanced diet with plenty of fruit and vegetables. Blood tests for lipids and cholesterol should begin at age 56 and be repeated every 5 years. If your lipid or cholesterol levels are high, you are over 50, or you are at high risk for heart disease, you may need your cholesterol levels checked more frequently.Ongoing high lipid and cholesterol levels should be treated with medicines if diet and exercise are not working.  If you smoke, find out from your health care provider how to quit. If you do not use tobacco, do not start.  Lung cancer screening is recommended for adults aged 39-80 years who are at high risk for developing lung cancer because of a history of smoking. A yearly low-dose CT scan of the lungs is recommended for people who have at least a 30-pack-year history of smoking and are a current smoker or have quit within the past 15 years. A pack year of smoking is smoking an average of 1 pack of cigarettes a day for 1 year (for example: 1 pack a day for 30 years or 2 packs a day for 15 years). Yearly screening should continue until the smoker has stopped  smoking for at least 15 years. Yearly screening should be stopped for people who develop a health problem that would prevent them from having lung cancer treatment.  If you are pregnant, do not drink alcohol. If you are breastfeeding, be very cautious about drinking alcohol. If you are not pregnant and choose to drink alcohol, do not have more than 1 drink per day. One drink is considered to be 12 ounces (355 mL) of beer, 5 ounces (148 mL) of wine, or 1.5 ounces (44 mL) of liquor.  Avoid use of street drugs. Do not share needles with anyone. Ask for help if you need support or instructions about stopping the use of drugs.  High blood pressure causes heart disease and increases the risk of stroke. Your blood pressure should be checked at least every 1 to 2 years. Ongoing high blood pressure should be treated with medicines if weight loss and exercise do not work.  If you are 96-72 years old, ask your health care provider if you should take aspirin to  prevent strokes.  Diabetes screening involves taking a blood sample to check your fasting blood sugar level. This should be done once every 3 years, after age 36, if you are within normal weight and without risk factors for diabetes. Testing should be considered at a younger age or be carried out more frequently if you are overweight and have at least 1 risk factor for diabetes.  Breast cancer screening is essential preventive care for women. You should practice "breast self-awareness." This means understanding the normal appearance and feel of your breasts and may include breast self-examination. Any changes detected, no matter how small, should be reported to a health care provider. Women in their 52s and 30s should have a clinical breast exam (CBE) by a health care provider as part of a regular health exam every 1 to 3 years. After age 78, women should have a CBE every year. Starting at age 27, women should consider having a mammogram (breast X-ray test)  every year. Women who have a family history of breast cancer should talk to their health care provider about genetic screening. Women at a high risk of breast cancer should talk to their health care providers about having an MRI and a mammogram every year.  Breast cancer gene (BRCA)-related cancer risk assessment is recommended for women who have family members with BRCA-related cancers. BRCA-related cancers include breast, ovarian, tubal, and peritoneal cancers. Having family members with these cancers may be associated with an increased risk for harmful changes (mutations) in the breast cancer genes BRCA1 and BRCA2. Results of the assessment will determine the need for genetic counseling and BRCA1 and BRCA2 testing.  Routine pelvic exams to screen for cancer are no longer recommended for nonpregnant women who are considered low risk for cancer of the pelvic organs (ovaries, uterus, and vagina) and who do not have symptoms. Ask your health care provider if a screening pelvic exam is right for you.  If you have had past treatment for cervical cancer or a condition that could lead to cancer, you need Pap tests and screening for cancer for at least 20 years after your treatment. If Pap tests have been discontinued, your risk factors (such as having a new sexual partner) need to be reassessed to determine if screening should be resumed. Some women have medical problems that increase the chance of getting cervical cancer. In these cases, your health care provider may recommend more frequent screening and Pap tests.  The HPV test is an additional test that may be used for cervical cancer screening. The HPV test looks for the virus that can cause the cell changes on the cervix. The cells collected during the Pap test can be tested for HPV. The HPV test could be used to screen women aged 53 years and older, and should be used in women of any age who have unclear Pap test results. After the age of 47, women should  have HPV testing at the same frequency as a Pap test.  Colorectal cancer can be detected and often prevented. Most routine colorectal cancer screening begins at the age of 24 years and continues through age 65 years. However, your health care provider may recommend screening at an earlier age if you have risk factors for colon cancer. On a yearly basis, your health care provider may provide home test kits to check for hidden blood in the stool. Use of a small camera at the end of a tube, to directly examine the colon (sigmoidoscopy or colonoscopy), can  detect the earliest forms of colorectal cancer. Talk to your health care provider about this at age 49, when routine screening begins. Direct exam of the colon should be repeated every 5-10 years through age 61 years, unless early forms of pre-cancerous polyps or small growths are found.  People who are at an increased risk for hepatitis B should be screened for this virus. You are considered at high risk for hepatitis B if:  You were born in a country where hepatitis B occurs often. Talk with your health care provider about which countries are considered high risk.  Your parents were born in a high-risk country and you have not received a shot to protect against hepatitis B (hepatitis B vaccine).  You have HIV or AIDS.  You use needles to inject street drugs.  You live with, or have sex with, someone who has hepatitis B.  You get hemodialysis treatment.  You take certain medicines for conditions like cancer, organ transplantation, and autoimmune conditions.  Hepatitis C blood testing is recommended for all people born from [birth year] through 1965 and any individual with known risks for hepatitis C.  Practice safe sex. Use condoms and avoid high-risk sexual practices to reduce the spread of sexually transmitted infections (STIs). STIs include gonorrhea, chlamydia, syphilis, trichomonas, herpes, HPV, and human immunodeficiency virus (HIV). Herpes, HIV,  and HPV are viral illnesses that have no cure. They can result in disability, cancer, and death.  You should be screened for sexually transmitted illnesses (STIs) including gonorrhea and chlamydia if:  You are sexually active and are younger than 24 years.  You are older than 24 years and your health care provider tells you that you are at risk for this type of infection.  Your sexual activity has changed since you were last screened and you are at an increased risk for chlamydia or gonorrhea. Ask your health care provider if you are at risk.  If you are at risk of being infected with HIV, it is recommended that you take a prescription medicine daily to prevent HIV infection. This is called preexposure prophylaxis (PrEP). You are considered at risk if:  You are a heterosexual woman, are sexually active, and are at increased risk for HIV infection.  You take drugs by injection.  You are sexually active with a partner who has HIV.  Talk with your health care provider about whether you are at high risk of being infected with HIV. If you choose to begin PrEP, you should first be tested for HIV. You should then be tested every 3 months for as long as you are taking PrEP.  Osteoporosis is a disease in which the bones lose minerals and strength with aging. This can result in serious bone fractures or breaks. The risk of osteoporosis can be identified using a bone density scan. Women ages 32 years and over and women at risk for fractures or osteoporosis should discuss screening with their health care providers. Ask your health care provider whether you should take a calcium supplement or vitamin D to reduce the rate of osteoporosis.  Menopause can be associated with physical symptoms and risks. Hormone replacement therapy is available to decrease symptoms and risks. You should talk to your health care provider about whether hormone replacement therapy is right for you.  Use sunscreen. Apply sunscreen  liberally and repeatedly throughout the day. You should seek shade when your shadow is shorter than you. Protect yourself by wearing long sleeves, pants, a wide-brimmed hat, and sunglasses year  round, whenever you are outdoors.  Once a month, do a whole body skin exam, using a mirror to look at the skin on your back. Tell your health care provider of new moles, moles that have irregular borders, moles that are larger than a pencil eraser, or moles that have changed in shape or color.  Stay current with required vaccines (immunizations).  Influenza vaccine. All adults should be immunized every year.  Tetanus, diphtheria, and acellular pertussis (Td, Tdap) vaccine. Pregnant women should receive 1 dose of Tdap vaccine during each pregnancy. The dose should be obtained regardless of the length of time since the last dose. Immunization is preferred during the 27th-36th week of gestation. An adult who has not previously received Tdap or who does not know her vaccine status should receive 1 dose of Tdap. This initial dose should be followed by tetanus and diphtheria toxoids (Td) booster doses every 10 years. Adults with an unknown or incomplete history of completing a 3-dose immunization series with Td-containing vaccines should begin or complete a primary immunization series including a Tdap dose. Adults should receive a Td booster every 10 years.  Varicella vaccine. An adult without evidence of immunity to varicella should receive 2 doses or a second dose if she has previously received 1 dose. Pregnant females who do not have evidence of immunity should receive the first dose after pregnancy. This first dose should be obtained before leaving the health care facility. The second dose should be obtained 4-8 weeks after the first dose.  Human papillomavirus (HPV) vaccine. Females aged 13-26 years who have not received the vaccine previously should obtain the 3-dose series. The vaccine is not recommended for use  in pregnant females. However, pregnancy testing is not needed before receiving a dose. If a female is found to be pregnant after receiving a dose, no treatment is needed. In that case, the remaining doses should be delayed until after the pregnancy. Immunization is recommended for any person with an immunocompromised condition through the age of 38 years if she did not get any or all doses earlier. During the 3-dose series, the second dose should be obtained 4-8 weeks after the first dose. The third dose should be obtained 24 weeks after the first dose and 16 weeks after the second dose.  Zoster vaccine. One dose is recommended for adults aged 92 years or older unless certain conditions are present.  Measles, mumps, and rubella (MMR) vaccine. Adults born before [birth year] generally are considered immune to measles and mumps. Adults born in [birth year] or later should have 1 or more doses of MMR vaccine unless there is a contraindication to the vaccine or there is laboratory evidence of immunity to each of the three diseases. A routine second dose of MMR vaccine should be obtained at least 28 days after the first dose for students attending postsecondary schools, health care workers, or international travelers. People who received inactivated measles vaccine or an unknown type of measles vaccine during 1963-1967 should receive 2 doses of MMR vaccine. People who received inactivated mumps vaccine or an unknown type of mumps vaccine before 1979 and are at high risk for mumps infection should consider immunization with 2 doses of MMR vaccine. For females of childbearing age, rubella immunity should be determined. If there is no evidence of immunity, females who are not pregnant should be vaccinated. If there is no evidence of immunity, females who are pregnant should delay immunization until after pregnancy. Unvaccinated health care workers born before [birth year] who lack  laboratory evidence of measles, mumps, or rubella immunity or  laboratory confirmation of disease should consider measles and mumps immunization with 2 doses of MMR vaccine or rubella immunization with 1 dose of MMR vaccine.  Pneumococcal 13-valent conjugate (PCV13) vaccine. When indicated, a person who is uncertain of her immunization history and has no record of immunization should receive the PCV13 vaccine. An adult aged 52 years or older who has certain medical conditions and has not been previously immunized should receive 1 dose of PCV13 vaccine. This PCV13 should be followed with a dose of pneumococcal polysaccharide (PPSV23) vaccine. The PPSV23 vaccine dose should be obtained at least 8 weeks after the dose of PCV13 vaccine. An adult aged 39 years or older who has certain medical conditions and previously received 1 or more doses of PPSV23 vaccine should receive 1 dose of PCV13. The PCV13 vaccine dose should be obtained 1 or more years after the last PPSV23 vaccine dose.  Pneumococcal polysaccharide (PPSV23) vaccine. When PCV13 is also indicated, PCV13 should be obtained first. All adults aged 32 years and older should be immunized. An adult younger than age 48 years who has certain medical conditions should be immunized. Any person who resides in a nursing home or long-term care facility should be immunized. An adult smoker should be immunized. People with an immunocompromised condition and certain other conditions should receive both PCV13 and PPSV23 vaccines. People with human immunodeficiency virus (HIV) infection should be immunized as soon as possible after diagnosis. Immunization during chemotherapy or radiation therapy should be avoided. Routine use of PPSV23 vaccine is not recommended for American Indians, [place]atives, or people younger than 65 years unless there are medical conditions that require PPSV23 vaccine. When indicated, people who have unknown immunization and have no record of immunization should receive PPSV23 vaccine. One-time revaccination  5 years after the first dose of PPSV23 is recommended for people aged 19-64 years who have chronic kidney failure, nephrotic syndrome, asplenia, or immunocompromised conditions. People who received 1-2 doses of PPSV23 before age 73 years should receive another dose of PPSV23 vaccine at age 63 years or later if at least 5 years have passed since the previous dose. Doses of PPSV23 are not needed for people immunized with PPSV23 at or after age 44 years.  Meningococcal vaccine. Adults with asplenia or persistent complement component deficiencies should receive 2 doses of quadrivalent meningococcal conjugate (MenACWY-D) vaccine. The doses should be obtained at least 2 months apart. Microbiologists working with certain meningococcal bacteria, [employer] recruits, people at risk during an outbreak, and people who travel to or live in countries with a high rate of meningitis should be immunized. A first-year college student up through age 72 years who is living in a residence hall should receive a dose if she did not receive a dose on or after her 16th birthday. Adults who have certain high-risk conditions should receive one or more doses of vaccine.  Hepatitis A vaccine. Adults who wish to be protected from this disease, have certain high-risk conditions, work with hepatitis A-infected animals, work in hepatitis A research labs, or travel to or work in countries with a high rate of hepatitis A should be immunized. Adults who were previously unvaccinated and who anticipate close contact with an international adoptee during the first 60 days after arrival in the [place] from a country with a high rate of hepatitis A should be immunized.  Hepatitis B vaccine. Adults who wish to be protected from this disease, have certain high-risk  conditions, may be exposed to blood or other infectious body fluids, are household contacts or sex partners of hepatitis B positive people, are clients or workers in certain care  facilities, or travel to or work in countries with a high rate of hepatitis B should be immunized.  Haemophilus influenzae type b (Hib) vaccine. A previously unvaccinated person with asplenia or sickle cell disease or having a scheduled splenectomy should receive 1 dose of Hib vaccine. Regardless of previous immunization, a recipient of a hematopoietic stem cell transplant should receive a 3-dose series 6-12 months after her successful transplant. Hib vaccine is not recommended for adults with HIV infection. Preventive Services / Frequency Ages 67 to 63 years  Blood pressure check.** / Every 1 to 2 years.  Lipid and cholesterol check.** / Every 5 years beginning at age 15.  Clinical breast exam.** / Every 3 years for women in their 89s and 80s.  BRCA-related cancer risk assessment.** / For women who have family members with a BRCA-related cancer (breast, ovarian, tubal, or peritoneal cancers).  Pap test.** / Every 2 years from ages 69 through 40. Every 3 years starting at age 39 through age 70 or 68 with a history of 3 consecutive normal Pap tests.  HPV screening.** / Every 3 years from ages 31 through ages 79 to 2 with a history of 3 consecutive normal Pap tests.  Hepatitis C blood test.** / For any individual with known risks for hepatitis C.  Skin self-exam. / Monthly.  Influenza vaccine. / Every year.  Tetanus, diphtheria, and acellular pertussis (Tdap, Td) vaccine.** / Consult your health care provider. Pregnant women should receive 1 dose of Tdap vaccine during each pregnancy. 1 dose of Td every 10 years.  Varicella vaccine.** / Consult your health care provider. Pregnant females who do not have evidence of immunity should receive the first dose after pregnancy.  HPV vaccine. / 3 doses over 6 months, if 38 and younger. The vaccine is not recommended for use in pregnant females. However, pregnancy testing is not needed before receiving a dose.  Measles, mumps, rubella (MMR)  vaccine.** / You need at least 1 dose of MMR if you were born in 1957 or later. You may also need a 2nd dose. For females of childbearing age, rubella immunity should be determined. If there is no evidence of immunity, females who are not pregnant should be vaccinated. If there is no evidence of immunity, females who are pregnant should delay immunization until after pregnancy.  Pneumococcal 13-valent conjugate (PCV13) vaccine.** / Consult your health care provider.  Pneumococcal polysaccharide (PPSV23) vaccine.** / 1 to 2 doses if you smoke cigarettes or if you have certain conditions.  Meningococcal vaccine.** / 1 dose if you are age 60 to 19 years and a Market researcher living in a residence hall, or have one of several medical conditions, you need to get vaccinated against meningococcal disease. You may also need additional booster doses.  Hepatitis A vaccine.** / Consult your health care provider.  Hepatitis B vaccine.** / Consult your health care provider.  Haemophilus influenzae type b (Hib) vaccine.** / Consult your health care provider. Ages 53 to 67 years  Blood pressure check.** / Every 1 to 2 years.  Lipid and cholesterol check.** / Every 5 years beginning at age 4 years.  Lung cancer screening. / Every year if you are aged 60-80 years and have a 30-pack-year history of smoking and currently smoke or have quit within the past 15 years. Yearly screening  is stopped once you have quit smoking for at least 15 years or develop a health problem that would prevent you from having lung cancer treatment.  Clinical breast exam.** / Every year after age 86 years.  BRCA-related cancer risk assessment.** / For women who have family members with a BRCA-related cancer (breast, ovarian, tubal, or peritoneal cancers).  Mammogram.** / Every year beginning at age 61 years and continuing for as long as you are in good health. Consult with your health care provider.  Pap test.** / Every  3 years starting at age 48 years through age 79 or 42 years with a history of 3 consecutive normal Pap tests.  HPV screening.** / Every 3 years from ages 97 years through ages 58 to 50 years with a history of 3 consecutive normal Pap tests.  Fecal occult blood test (FOBT) of stool. / Every year beginning at age 66 years and continuing until age 58 years. You may not need to do this test if you get a colonoscopy every 10 years.  Flexible sigmoidoscopy or colonoscopy.** / Every 5 years for a flexible sigmoidoscopy or every 10 years for a colonoscopy beginning at age 71 years and continuing until age 50 years.  Hepatitis C blood test.** / For all people born from [birth year] through 1965 and any individual with known risks for hepatitis C.  Skin self-exam. / Monthly.  Influenza vaccine. / Every year.  Tetanus, diphtheria, and acellular pertussis (Tdap/Td) vaccine.** / Consult your health care provider. Pregnant women should receive 1 dose of Tdap vaccine during each pregnancy. 1 dose of Td every 10 years.  Varicella vaccine.** / Consult your health care provider. Pregnant females who do not have evidence of immunity should receive the first dose after pregnancy.  Zoster vaccine.** / 1 dose for adults aged 22 years or older.  Measles, mumps, rubella (MMR) vaccine.** / You need at least 1 dose of MMR if you were born in 1957 or later. You may also need a 2nd dose. For females of childbearing age, rubella immunity should be determined. If there is no evidence of immunity, females who are not pregnant should be vaccinated. If there is no evidence of immunity, females who are pregnant should delay immunization until after pregnancy.  Pneumococcal 13-valent conjugate (PCV13) vaccine.** / Consult your health care provider.  Pneumococcal polysaccharide (PPSV23) vaccine.** / 1 to 2 doses if you smoke cigarettes or if you have certain conditions.  Meningococcal vaccine.** / Consult your health care  provider.  Hepatitis A vaccine.** / Consult your health care provider.  Hepatitis B vaccine.** / Consult your health care provider.  Haemophilus influenzae type b (Hib) vaccine.** / Consult your health care provider. Ages 17 years and over  Blood pressure check.** / Every 1 to 2 years.  Lipid and cholesterol check.** / Every 5 years beginning at age 41 years.  Lung cancer screening. / Every year if you are aged 73-80 years and have a 30-pack-year history of smoking and currently smoke or have quit within the past 15 years. Yearly screening is stopped once you have quit smoking for at least 15 years or develop a health problem that would prevent you from having lung cancer treatment.  Clinical breast exam.** / Every year after age 65 years.  BRCA-related cancer risk assessment.** / For women who have family members with a BRCA-related cancer (breast, ovarian, tubal, or peritoneal cancers).  Mammogram.** / Every year beginning at age 84 years and continuing for as long as you are  in good health. Consult with your health care provider.  Pap test.** / Every 3 years starting at age 4 years through age 9 or 21 years with 3 consecutive normal Pap tests. Testing can be stopped between 65 and 70 years with 3 consecutive normal Pap tests and no abnormal Pap or HPV tests in the past 10 years.  HPV screening.** / Every 3 years from ages 34 years through ages 50 or 59 years with a history of 3 consecutive normal Pap tests. Testing can be stopped between 65 and 70 years with 3 consecutive normal Pap tests and no abnormal Pap or HPV tests in the past 10 years.  Fecal occult blood test (FOBT) of stool. / Every year beginning at age 40 years and continuing until age 51 years. You may not need to do this test if you get a colonoscopy every 10 years.  Flexible sigmoidoscopy or colonoscopy.** / Every 5 years for a flexible sigmoidoscopy or every 10 years for a colonoscopy beginning at age 19 years and  continuing until age 26 years.  Hepatitis C blood test.** / For all people born from [birth year] through 1965 and any individual with known risks for hepatitis C.  Osteoporosis screening.** / A one-time screening for women ages 16 years and over and women at risk for fractures or osteoporosis.  Skin self-exam. / Monthly.  Influenza vaccine. / Every year.  Tetanus, diphtheria, and acellular pertussis (Tdap/Td) vaccine.** / 1 dose of Td every 10 years.  Varicella vaccine.** / Consult your health care provider.  Zoster vaccine.** / 1 dose for adults aged 53 years or older.  Pneumococcal 13-valent conjugate (PCV13) vaccine.** / Consult your health care provider.  Pneumococcal polysaccharide (PPSV23) vaccine.** / 1 dose for all adults aged 54 years and older.  Meningococcal vaccine.** / Consult your health care provider.  Hepatitis A vaccine.** / Consult your health care provider.  Hepatitis B vaccine.** / Consult your health care provider.  Haemophilus influenzae type b (Hib) vaccine.** / Consult your health care provider. ** Family history and personal history of risk and conditions may change your health care provider's recommendations. Document Released: 09/29/2001 Document Revised: 12/18/2013 Document Reviewed: 12/29/2010 [facility] Patient Information 2015 [place], [place]. This information is not intended to replace advice given to you by your health care provider. Make sure you discuss any questions you have with your health care provider.

## 2014-10-31 ENCOUNTER — Encounter: Payer: 59 | Admitting: Physician Assistant

## 2015-03-20 ENCOUNTER — Encounter: Payer: Self-pay | Admitting: Cardiology

## 2015-03-27 ENCOUNTER — Encounter: Payer: Self-pay | Admitting: Physician Assistant

## 2015-03-27 ENCOUNTER — Other Ambulatory Visit: Payer: Self-pay | Admitting: Physician Assistant

## 2015-03-27 DIAGNOSIS — M26629 Arthralgia of temporomandibular joint, unspecified side: Secondary | ICD-10-CM

## 2015-03-27 DIAGNOSIS — G479 Sleep disorder, unspecified: Secondary | ICD-10-CM

## 2015-03-27 MED ORDER — MELOXICAM 15 MG PO TABS: 15.0000 mg | ORAL_TABLET | Freq: Every day | ORAL | Status: DC

## 2015-03-27 MED ORDER — ALPRAZOLAM 0.25 MG PO TABS: 0.2500 mg | ORAL_TABLET | Freq: Every evening | ORAL | Status: DC | PRN

## 2015-05-10 ENCOUNTER — Other Ambulatory Visit: Payer: Self-pay | Admitting: Physician Assistant

## 2015-06-02 ENCOUNTER — Other Ambulatory Visit: Payer: Self-pay | Admitting: Physician Assistant

## 2015-06-24 ENCOUNTER — Ambulatory Visit: Payer: No Typology Code available for payment source | Admitting: Physician Assistant

## 2015-06-26 ENCOUNTER — Encounter: Payer: Self-pay | Admitting: Physician Assistant

## 2015-06-26 ENCOUNTER — Ambulatory Visit (INDEPENDENT_AMBULATORY_CARE_PROVIDER_SITE_OTHER): Payer: No Typology Code available for payment source | Admitting: Physician Assistant

## 2015-06-26 VITALS — BP 133/91 | HR 61 | Temp 98.0°F | Resp 16 | Ht 63.0 in | Wt 167.2 lb

## 2015-06-26 DIAGNOSIS — F411 Generalized anxiety disorder: Secondary | ICD-10-CM | POA: Diagnosis not present

## 2015-06-26 DIAGNOSIS — Z23 Encounter for immunization: Secondary | ICD-10-CM | POA: Diagnosis not present

## 2015-06-26 DIAGNOSIS — K219 Gastro-esophageal reflux disease without esophagitis: Secondary | ICD-10-CM

## 2015-06-26 MED ORDER — FLUOXETINE HCL 10 MG PO TABS: 10.0000 mg | ORAL_TABLET | Freq: Every day | ORAL | Status: DC

## 2015-06-26 MED ORDER — OMEPRAZOLE 20 MG PO CPDR: 20.0000 mg | DELAYED_RELEASE_CAPSULE | Freq: Every day | ORAL | Status: DC

## 2015-06-26 NOTE — Patient Instructions (Signed)
Please go to the lab for blood work.  Please start the Omeprazole daily as directed. Start a probiotic (Align, Culturelle, Digestive Advantage) daily. Limit late night eating. We will alter treatment based on results.  Please start the Fluoxetine daily to help with anxiety. Continue counseling. We will follow-up in 1 month.

## 2015-06-26 NOTE — Progress Notes (Signed)
Pre visit review using our clinic review tool, if applicable. No additional management support is needed unless otherwise documented below in the visit note/SLS  

## 2015-06-26 NOTE — Progress Notes (Signed)
Patient presents to clinic today with multiple concerns.  Patient c/o worsening of reflux over the past 2-3 months despite taking her Zantac. Denies change to diet. Endorses is eating a more low-fat diet presently. Denies nausea or vomiting. Patient also endorses intermittent RUQ pain described as aching and mild. Is occurring about once a week and lasting maybe the rest of the day.. Patient is s/p cholecystectomy. Colonoscopy recently revealed diverticulosis in the hepatic flexure. Patient denies melena, hematochezia or tenesmus.  Patient still having some anxiety secondary to stress surrounding husband's health issues. Is seeing counselor at [facility] to help with symptoms. Denies depressed mood or anhedonia. Considers herself a "worry wart". Anxiety is affecting quality of life.  Past Medical History  Diagnosis Date  . Migraine headache   . Insomnia   . History of chicken pox   . Osteoarthritis     Lumbar spine; hands; feet  . GERD without esophagitis   . Heartburn   . Elevated cholesterol   . UTI (lower urinary tract infection)   . Basal cell adenocarcinoma     Current Outpatient Prescriptions on File Prior to Visit  Medication Sig Dispense Refill  . ALPRAZolam (XANAX) 0.25 MG tablet Take 1 tablet (0.25 mg total) by mouth at bedtime as needed for anxiety. 30 tablet 2  . Ascorbic Acid (VITAMIN C) 100 MG tablet Take 100 mg by mouth daily.    . B Complex-C (SUPER B COMPLEX/VITAMIN C PO) Take by mouth daily.    . Biotin 1 MG CAPS Take by mouth daily.    . cetirizine (ZYRTEC) 10 MG chewable tablet Chew 10 mg by mouth daily as needed for allergies.    . cholecalciferol (VITAMIN D) 400 UNITS TABS tablet Take 1 tablet (400 Units total) by mouth daily. 90 tablet 1  . Estradiol Acetate 0.05 MG/24HR RING Place vaginally every 3 (three) months.    . meloxicam (MOBIC) 15 MG tablet Take 1 tablet (15 mg total) by mouth daily. 30 tablet 1  . Nutritional Supplements (JUICE PLUS FIBRE) LIQD Take  by mouth daily.    . ranitidine (ZANTAC) 150 MG tablet TAKE ONE TABLET BY MOUTH DAILY AS NEEDED FOR HEARTBURN 30 tablet 11  . SUMAtriptan (IMITREX) 100 MG tablet TAKE 1 TABLET BY MOUTH AS NEEDED FOR HEADACHE. MAY REPEAT IN 2 HOURS IF HEADACHE PERSISTS OR RECURS 10 tablet 3   No current facility-administered medications on file prior to visit.    No Known Allergies  Family History  Problem Relation Age of Onset  . Arthritis-Osteo Mother     Living  . Arthritis-Osteo Father     Living  . Diabetes Mother   . Diabetes Father   . Hyperlipidemia Mother   . Hypertension Mother   . Hypertension Father   . Heart attack Maternal Grandfather   . Heart attack Maternal Grandmother   . Alzheimer's disease Maternal Grandmother   . Pancreatic cancer Paternal Grandfather   . Dementia Paternal Grandmother   . Breast cancer Maternal Aunt   . Healthy Brother     x1  . Healthy Sister     x1  . Healthy Son     x2    Social History   Social History  . Marital Status: Married    Spouse Name: N/A  . Number of Children: N/A  . Years of Education: N/A   Social History Main Topics  . Smoking status: Never Smoker   . Smokeless tobacco: None  . Alcohol Use: None  .  Drug Use: None  . Sexual Activity: Not Asked   Other Topics Concern  . None   Social History Warehouse manager.   Review of Systems - See HPI.  All other ROS are negative.  BP 133/91 mmHg  Pulse 61  Temp(Src) 98 F (36.7 C) (Oral)  Resp 16  Ht 5\' 3"  (1.6 m)  Wt 167 lb 4 oz (75.864 kg)  BMI 29.63 kg/m2  SpO2 98%  Physical Exam  Constitutional: She is oriented to person, place, and time and well-developed, well-nourished, and in no distress.  HENT:  Head: Normocephalic and atraumatic.  Eyes: Conjunctivae are normal.  Cardiovascular: Normal rate, regular rhythm, normal heart sounds and intact distal pulses.   Pulmonary/Chest: Effort normal and breath sounds normal. No respiratory distress. She has no  wheezes. She has no rales. She exhibits no tenderness.  Abdominal: Soft. Bowel sounds are normal. She exhibits no distension and no mass. There is no tenderness. There is no rebound and no guarding.  Neurological: She is alert and oriented to person, place, and time.  Skin: Skin is warm and dry. No rash noted.  Psychiatric: Affect normal.  Vitals reviewed.   Assessment/Plan: Encounter for immunization Flu vaccination given by nursing staff.  Gastroesophageal reflux disease without esophagitis We'll begin daily Prilosec due to persistence of GERD symptoms. We'll also obtain H. pylori antibody  Generalized anxiety disorder Will begin Fluoxetine daily. Will continue Xanax at bedtime. Counseling discussed. Patient will give some thought to this. Follow-up 1 month.

## 2015-06-27 ENCOUNTER — Encounter: Payer: Self-pay | Admitting: Physician Assistant

## 2015-06-27 LAB — H. PYLORI ANTIBODY, IGG: H Pylori IgG: NEGATIVE

## 2015-06-30 DIAGNOSIS — F411 Generalized anxiety disorder: Secondary | ICD-10-CM | POA: Insufficient documentation

## 2015-06-30 DIAGNOSIS — Z23 Encounter for immunization: Secondary | ICD-10-CM | POA: Insufficient documentation

## 2015-06-30 NOTE — Assessment & Plan Note (Signed)
Will begin Fluoxetine daily. Will continue Xanax at bedtime. Counseling discussed. Patient will give some thought to this. Follow-up 1 month.

## 2015-06-30 NOTE — Assessment & Plan Note (Signed)
Flu vaccination given by nursing staff.

## 2015-06-30 NOTE — Assessment & Plan Note (Signed)
We'll begin daily Prilosec due to persistence of GERD symptoms. We'll also obtain H. pylori antibody

## 2015-07-31 ENCOUNTER — Ambulatory Visit (INDEPENDENT_AMBULATORY_CARE_PROVIDER_SITE_OTHER): Payer: No Typology Code available for payment source | Admitting: Physician Assistant

## 2015-07-31 ENCOUNTER — Encounter: Payer: Self-pay | Admitting: Physician Assistant

## 2015-07-31 VITALS — BP 110/78 | HR 82 | Temp 97.9°F | Ht 63.0 in | Wt 166.0 lb

## 2015-07-31 DIAGNOSIS — F411 Generalized anxiety disorder: Secondary | ICD-10-CM

## 2015-07-31 MED ORDER — FLUOXETINE HCL 20 MG PO TABS: 20.0000 mg | ORAL_TABLET | Freq: Every day | ORAL | Status: DC

## 2015-07-31 NOTE — Progress Notes (Signed)
Pre visit review using our clinic review tool, if applicable. No additional management support is needed unless otherwise documented below in the visit note. 

## 2015-07-31 NOTE — Assessment & Plan Note (Signed)
Increase Prozac to 20 mg daily. Continue Xanax PRN. Follow-up 3 months as long as symptoms are continuing to improve.

## 2015-07-31 NOTE — Patient Instructions (Signed)
Please start the new dose of the Prozac (20 mg) daily. Continue your other medications as prescribed. As long as thing continue to improve, I will see you in 3 months.

## 2015-07-31 NOTE — Progress Notes (Signed)
Patient presents to clinic today for follow-up of generalized anxiety after starting Prozac 10 mg daily. Endorses some mild improvement in symptoms. Is tolerating medication well without side effects. Has had some increase in financial stressors without deterioration of mood. Denies SI/HI.  Past Medical History  Diagnosis Date  . Migraine headache   . Insomnia   . History of chicken pox   . Osteoarthritis     Lumbar spine; hands; feet  . GERD without esophagitis   . Heartburn   . Elevated cholesterol   . UTI (lower urinary tract infection)   . Basal cell adenocarcinoma     Current Outpatient Prescriptions on File Prior to Visit  Medication Sig Dispense Refill  . ALPRAZolam (XANAX) 0.25 MG tablet Take 1 tablet (0.25 mg total) by mouth at bedtime as needed for anxiety. 30 tablet 2  . Ascorbic Acid (VITAMIN C) 100 MG tablet Take 100 mg by mouth daily.    . B Complex-C (SUPER B COMPLEX/VITAMIN C PO) Take by mouth daily.    . Biotin 1 MG CAPS Take by mouth daily.    . cetirizine (ZYRTEC) 10 MG chewable tablet Chew 10 mg by mouth daily as needed for allergies.    . cholecalciferol (VITAMIN D) 400 UNITS TABS tablet Take 1 tablet (400 Units total) by mouth daily. 90 tablet 1  . meloxicam (MOBIC) 15 MG tablet Take 1 tablet (15 mg total) by mouth daily. 30 tablet 1  . Misc Natural Products (OSTEO BI-FLEX ADV JOINT SHIELD) TABS Take 2 tablets by mouth daily.    . Nutritional Supplements (JUICE PLUS FIBRE) LIQD Take by mouth daily.    . Omega-3 Fatty Acids (OMEGA-3 FISH OIL) 1200 MG CAPS Take by mouth daily.    Marland Kitchen omeprazole (PRILOSEC) 20 MG capsule Take 1 capsule (20 mg total) by mouth daily. 30 capsule 3  . ranitidine (ZANTAC) 150 MG tablet TAKE ONE TABLET BY MOUTH DAILY AS NEEDED FOR HEARTBURN 30 tablet 11  . SUMAtriptan (IMITREX) 100 MG tablet TAKE 1 TABLET BY MOUTH AS NEEDED FOR HEADACHE. MAY REPEAT IN 2 HOURS IF HEADACHE PERSISTS OR RECURS 10 tablet 3   No current facility-administered  medications on file prior to visit.    No Known Allergies  Family History  Problem Relation Age of Onset  . Arthritis-Osteo Mother     Living  . Arthritis-Osteo Father     Living  . Diabetes Mother   . Diabetes Father   . Hyperlipidemia Mother   . Hypertension Mother   . Hypertension Father   . Heart attack Maternal Grandfather   . Heart attack Maternal Grandmother   . Alzheimer's disease Maternal Grandmother   . Pancreatic cancer Paternal Grandfather   . Dementia Paternal Grandmother   . Breast cancer Maternal Aunt   . Healthy Brother     x1  . Healthy Sister     x1  . Healthy Son     x2    Social History   Social History  . Marital Status: Married    Spouse Name: N/A  . Number of Children: N/A  . Years of Education: N/A   Social History Main Topics  . Smoking status: Never Smoker   . Smokeless tobacco: None  . Alcohol Use: None  . Drug Use: None  . Sexual Activity: Not Asked   Other Topics Concern  . None   Social History Warehouse manager.   Review of Systems - See HPI.  All other  ROS are negative.  BP 110/78 mmHg  Pulse 82  Temp(Src) 97.9 F (36.6 C) (Oral)  Ht 5\' 3"  (1.6 m)  Wt 166 lb (75.297 kg)  BMI 29.41 kg/m2  SpO2 98%  Physical Exam  Constitutional: She is oriented to person, place, and time and well-developed, well-nourished, and in no distress.  Cardiovascular: Normal rate, regular rhythm, normal heart sounds and intact distal pulses.   Pulmonary/Chest: Effort normal.  Neurological: She is alert and oriented to person, place, and time.  Skin: Skin is warm and dry. No rash noted.  Psychiatric: Affect normal.  Vitals reviewed.   Recent Results (from the past 2160 hour(s))  H. pylori antibody, IgG     Status: None   Collection Time: 06/26/15  3:10 PM  Result Value Ref Range   H Pylori IgG Negative Negative    Assessment/Plan: Generalized anxiety disorder Increase Prozac to 20 mg daily. Continue Xanax PRN. Follow-up 3  months as long as symptoms are continuing to improve.

## 2015-10-03 ENCOUNTER — Encounter: Payer: Self-pay | Admitting: Physician Assistant

## 2015-10-15 ENCOUNTER — Other Ambulatory Visit: Payer: Self-pay | Admitting: Physician Assistant

## 2015-10-15 ENCOUNTER — Encounter: Payer: Self-pay | Admitting: Physician Assistant

## 2015-10-21 ENCOUNTER — Ambulatory Visit (INDEPENDENT_AMBULATORY_CARE_PROVIDER_SITE_OTHER): Payer: BLUE CROSS/BLUE SHIELD | Admitting: Physician Assistant

## 2015-10-21 ENCOUNTER — Encounter: Payer: Self-pay | Admitting: Physician Assistant

## 2015-10-21 VITALS — BP 136/67 | HR 78 | Temp 98.1°F | Ht 63.0 in | Wt 165.8 lb

## 2015-10-21 DIAGNOSIS — F411 Generalized anxiety disorder: Secondary | ICD-10-CM | POA: Diagnosis not present

## 2015-10-21 DIAGNOSIS — N959 Unspecified menopausal and perimenopausal disorder: Secondary | ICD-10-CM | POA: Diagnosis not present

## 2015-10-21 MED ORDER — VENLAFAXINE HCL ER 75 MG PO TB24: ORAL_TABLET | ORAL | Status: DC

## 2015-10-21 NOTE — Assessment & Plan Note (Signed)
With postmenopausal symptoms will stop Fluoxetine and switch to Venlafaxine Xr 37.5 mg daily for 2 weeks. Will then increase to 75 mg daily. Follow-up 1 month.

## 2015-10-21 NOTE — Progress Notes (Signed)
Pre visit review using our clinic review tool, if applicable. No additional management support is needed unless otherwise documented below in the visit note. 

## 2015-10-21 NOTE — Progress Notes (Signed)
Patient presents to clinic today to discuss medication options for post-menopausal hot flashes. Is currently on Fluoxetine for anxiety and mood with good results previously. Is having menopausal symptoms after having her Femring removed. Was started on Premarin by GYN. Was told to come see PCP regarding other medication changes to help symptoms.  Past Medical History  Diagnosis Date  . Migraine headache   . Insomnia   . History of chicken pox   . Osteoarthritis     Lumbar spine; hands; feet  . GERD without esophagitis   . Heartburn   . Elevated cholesterol   . UTI (lower urinary tract infection)   . Basal cell adenocarcinoma     Current Outpatient Prescriptions on File Prior to Visit  Medication Sig Dispense Refill  . ALPRAZolam (XANAX) 0.25 MG tablet Take 1 tablet (0.25 mg total) by mouth at bedtime as needed for anxiety. 30 tablet 2  . Ascorbic Acid (VITAMIN C) 100 MG tablet Take 100 mg by mouth daily.    . B Complex-C (SUPER B COMPLEX/VITAMIN C PO) Take by mouth daily.    . Biotin 1 MG CAPS Take by mouth daily.    . cetirizine (ZYRTEC) 10 MG chewable tablet Chew 10 mg by mouth daily as needed for allergies.    . cholecalciferol (VITAMIN D) 400 UNITS TABS tablet Take 1 tablet (400 Units total) by mouth daily. 90 tablet 1  . meloxicam (MOBIC) 15 MG tablet Take 1 tablet (15 mg total) by mouth daily. 30 tablet 1  . Misc Natural Products (OSTEO BI-FLEX ADV JOINT SHIELD) TABS Take 2 tablets by mouth daily.    . Nutritional Supplements (JUICE PLUS FIBRE) LIQD Take by mouth daily.    . Omega-3 Fatty Acids (OMEGA-3 FISH OIL) 1200 MG CAPS Take by mouth daily.    Marland Kitchen omeprazole (PRILOSEC) 20 MG capsule TAKE 1 CAPSULE (20 MG TOTAL) BY MOUTH DAILY. 30 capsule 3  . ranitidine (ZANTAC) 150 MG tablet TAKE ONE TABLET BY MOUTH DAILY AS NEEDED FOR HEARTBURN 30 tablet 11  . SUMAtriptan (IMITREX) 100 MG tablet TAKE 1 TABLET BY MOUTH AS NEEDED FOR HEADACHE. MAY REPEAT IN 2 HOURS IF HEADACHE PERSISTS OR  RECURS 10 tablet 3   No current facility-administered medications on file prior to visit.    No Known Allergies  Family History  Problem Relation Age of Onset  . Arthritis-Osteo Mother     Living  . Arthritis-Osteo Father     Living  . Diabetes Mother   . Diabetes Father   . Hyperlipidemia Mother   . Hypertension Mother   . Hypertension Father   . Heart attack Maternal Grandfather   . Heart attack Maternal Grandmother   . Alzheimer's disease Maternal Grandmother   . Pancreatic cancer Paternal Grandfather   . Dementia Paternal Grandmother   . Breast cancer Maternal Aunt   . Healthy Brother     x1  . Healthy Sister     x1  . Healthy Son     x2    Social History   Social History  . Marital Status: Married    Spouse Name: N/A  . Number of Children: N/A  . Years of Education: N/A   Social History Main Topics  . Smoking status: Never Smoker   . Smokeless tobacco: None  . Alcohol Use: None  . Drug Use: None  . Sexual Activity: Not Asked   Other Topics Concern  . None   Social History Warehouse manager.  Review of Systems - See HPI.  All other ROS are negative.  BP 136/67 mmHg  Pulse 78  Temp(Src) 98.1 F (36.7 C) (Oral)  Ht 5\' 3"  (1.6 m)  Wt 165 lb 12.8 oz (75.206 kg)  BMI 29.38 kg/m2  SpO2 99%  Physical Exam  Constitutional: She is oriented to person, place, and time and well-developed, well-nourished, and in no distress.  HENT:  Head: Normocephalic and atraumatic.  Eyes: Conjunctivae are normal.  Neck: Neck supple.  Cardiovascular: Normal rate, regular rhythm, normal heart sounds and intact distal pulses.   Pulmonary/Chest: Effort normal and breath sounds normal. No respiratory distress. She has no wheezes. She has no rales. She exhibits no tenderness.  Neurological: She is alert and oriented to person, place, and time.  Skin: Skin is warm and dry. No rash noted.  Psychiatric: Affect normal.  Vitals reviewed.   No results found for  this or any previous visit (from the past 2160 hour(s)).  Assessment/Plan: Generalized anxiety disorder With postmenopausal symptoms will stop Fluoxetine and switch to Venlafaxine Xr 37.5 mg daily for 2 weeks. Will then increase to 75 mg daily. Follow-up 1 month.

## 2015-10-21 NOTE — Patient Instructions (Signed)
Please stop the Prozac and begin the Venlafaxine XR as directed. Continue other medications as directed. Follow-up with me in 1 month.  Return sooner if needed.

## 2015-10-23 ENCOUNTER — Encounter: Payer: Self-pay | Admitting: Physician Assistant

## 2015-10-29 ENCOUNTER — Ambulatory Visit: Payer: No Typology Code available for payment source | Admitting: Physician Assistant

## 2015-10-31 ENCOUNTER — Encounter: Payer: Self-pay | Admitting: Physician Assistant

## 2015-11-01 NOTE — Telephone Encounter (Signed)
Received Denial on Venlafaxine HCI ER tablet; two alternative formulary medications have to be tried and failed, were ineffective, or not tolerated. Patient has only tried & failed one of the alternative medications. Alternative medications include: sertraline, mirtazapine, citalopram, duloxetine, fluvoxamine, bupropion, Venlafaxine Capsules; would you like me to change prescription to capsules. Please Advise/SLS 03/17

## 2015-11-04 MED ORDER — VENLAFAXINE HCL ER 37.5 MG PO CP24: 37.5000 mg | ORAL_CAPSULE | Freq: Every day | ORAL | Status: DC

## 2015-11-04 NOTE — Telephone Encounter (Signed)
I have switched to capsules and informed patient. She will try to pick up new Rx and call if there is any issue.

## 2015-11-12 ENCOUNTER — Encounter: Payer: Self-pay | Admitting: Physician Assistant

## 2015-11-12 ENCOUNTER — Other Ambulatory Visit: Payer: Self-pay | Admitting: Physician Assistant

## 2015-11-12 NOTE — Telephone Encounter (Signed)
Last filled

## 2015-11-13 MED ORDER — VENLAFAXINE HCL ER 75 MG PO CP24: 75.0000 mg | ORAL_CAPSULE | Freq: Every day | ORAL | Status: DC

## 2015-11-13 NOTE — Telephone Encounter (Signed)
Rx denied.  Pt no longer taking this medication.//AB/CMA

## 2015-11-18 ENCOUNTER — Ambulatory Visit: Payer: BLUE CROSS/BLUE SHIELD | Admitting: Physician Assistant

## 2015-11-22 ENCOUNTER — Encounter: Payer: Self-pay | Admitting: Physician Assistant

## 2015-12-18 ENCOUNTER — Other Ambulatory Visit: Payer: Self-pay | Admitting: Physician Assistant

## 2015-12-24 ENCOUNTER — Encounter: Payer: Self-pay | Admitting: *Deleted

## 2015-12-24 ENCOUNTER — Telehealth: Payer: Self-pay | Admitting: *Deleted

## 2015-12-24 NOTE — Telephone Encounter (Signed)
Pre-Visit Call completed with patient and chart updated.   Pre-Visit Info documented in Specialty Comments under [employer].    

## 2015-12-25 ENCOUNTER — Encounter: Payer: Self-pay | Admitting: Physician Assistant

## 2015-12-25 ENCOUNTER — Ambulatory Visit (INDEPENDENT_AMBULATORY_CARE_PROVIDER_SITE_OTHER): Payer: BLUE CROSS/BLUE SHIELD | Admitting: Physician Assistant

## 2015-12-25 VITALS — BP 110/74 | HR 72 | Temp 98.3°F | Resp 16 | Ht 63.0 in | Wt 165.4 lb

## 2015-12-25 DIAGNOSIS — F411 Generalized anxiety disorder: Secondary | ICD-10-CM | POA: Diagnosis not present

## 2015-12-25 DIAGNOSIS — G479 Sleep disorder, unspecified: Secondary | ICD-10-CM | POA: Diagnosis not present

## 2015-12-25 DIAGNOSIS — K219 Gastro-esophageal reflux disease without esophagitis: Secondary | ICD-10-CM | POA: Diagnosis not present

## 2015-12-25 DIAGNOSIS — Z0000 Encounter for general adult medical examination without abnormal findings: Secondary | ICD-10-CM

## 2015-12-25 LAB — COMPREHENSIVE METABOLIC PANEL
ALBUMIN: 4.4 g/dL (ref 3.5–5.2)
ALK PHOS: 77 U/L (ref 39–117)
ALT: 54 U/L — AB (ref 0–35)
AST: 46 U/L — AB (ref 0–37)
BILIRUBIN TOTAL: 0.4 mg/dL (ref 0.2–1.2)
BUN: 19 mg/dL (ref 6–23)
CALCIUM: 10.1 mg/dL (ref 8.4–10.5)
CO2: 28 meq/L (ref 19–32)
CREATININE: 0.95 mg/dL (ref 0.40–1.20)
Chloride: 101 mEq/L (ref 96–112)
GFR: 63.85 mL/min (ref >60.00)
Glucose, Bld: 100 mg/dL — ABNORMAL HIGH (ref 70–99)
Potassium: 4.1 mEq/L (ref 3.5–5.1)
Sodium: 137 mEq/L (ref 135–145)
TOTAL PROTEIN: 7.4 g/dL (ref 6.0–8.3)

## 2015-12-25 LAB — CBC
HCT: 41.2 % (ref 36.0–46.0)
Hemoglobin: 13.9 g/dL (ref 12.0–15.0)
MCHC: 33.8 g/dL (ref 30.0–36.0)
MCV: 85.5 fl (ref 78.0–100.0)
PLATELETS: 307 10*3/uL (ref 150.0–400.0)
RBC: 4.81 Mil/uL (ref 3.87–5.11)
RDW: 13.3 % (ref 11.5–15.5)
WBC: 6.2 10*3/uL (ref 4.0–10.5)

## 2015-12-25 LAB — HEMOGLOBIN A1C: Hgb A1c MFr Bld: 6 % (ref 4.6–6.5)

## 2015-12-25 LAB — LIPID PANEL
CHOL/HDL RATIO: 5
CHOLESTEROL: 310 mg/dL — AB (ref 0–200)
HDL: 59.6 mg/dL (ref >39.00)
NonHDL: 249.97
Triglycerides: 378 mg/dL — ABNORMAL HIGH (ref 0.0–149.0)
VLDL: 75.6 mg/dL — AB (ref 0.0–40.0)

## 2015-12-25 LAB — TSH: TSH: 1.16 u[IU]/mL (ref 0.35–4.50)

## 2015-12-25 LAB — LDL CHOLESTEROL, DIRECT: Direct LDL: 197 mg/dL

## 2015-12-25 MED ORDER — VENLAFAXINE HCL ER 150 MG PO CP24: 150.0000 mg | ORAL_CAPSULE | Freq: Every day | ORAL | Status: DC

## 2015-12-25 MED ORDER — CELECOXIB 100 MG PO CAPS: 100.0000 mg | ORAL_CAPSULE | Freq: Every day | ORAL | Status: DC

## 2015-12-25 MED ORDER — ALPRAZOLAM 0.25 MG PO TABS: 0.2500 mg | ORAL_TABLET | Freq: Every evening | ORAL | Status: DC | PRN

## 2015-12-25 NOTE — Assessment & Plan Note (Signed)
Much improved. Will continue current regimen. Discussed need to attempt to wean off of PPI in the near future.

## 2015-12-25 NOTE — Progress Notes (Signed)
Patient presents to clinic today for annual exam.  Patient is fasting for labs.  Chronic Issues: GERD -- Endorses much improved with Prilosec. Endorses taking as directed. Is watching diet and using a probiotic daily.  Insomnia -- Endorses much improved with Effexor 75 mg daily. Is only having to take Xanax on rare occasions to help with sleep.  Anxiety/Depression -- Endorses mood improved along with hot flashes with Effexor. Endorses she still has days where she is down in the dumps. Would like to discuss dosing. Denies SI/HI.  Health Maintenance: Immunizations -- up-to-date per patient. Colonoscopy -- up-to-date.  Mammogram -- Up-to-date PAP -- s/p hysterectomy  Past Medical History  Diagnosis Date  . Migraine headache   . Insomnia   . History of chicken pox   . Osteoarthritis     Lumbar spine; hands; feet  . GERD without esophagitis   . Heartburn   . Elevated cholesterol   . UTI (lower urinary tract infection)   . Basal cell adenocarcinoma     Past Surgical History  Procedure Laterality Date  . Bladder repair  2000    Mesh  . Breast biopsy  1990  . Appendectomy  1988  . Abdominal hysterectomy  1988  . Basal cell carcinoma excision      Multiple  . Cholecystectomy      Current Outpatient Prescriptions on File Prior to Visit  Medication Sig Dispense Refill  . Ascorbic Acid (VITAMIN C) 100 MG tablet Take 100 mg by mouth daily.    . B Complex-C (SUPER B COMPLEX/VITAMIN C PO) Take by mouth daily.    . Biotin 1 MG CAPS Take by mouth daily.    . cetirizine (ZYRTEC) 10 MG chewable tablet Chew 10 mg by mouth daily as needed for allergies.    . cholecalciferol (VITAMIN D) 400 UNITS TABS tablet Take 1 tablet (400 Units total) by mouth daily. 90 tablet 1  . Misc Natural Products (OSTEO BI-FLEX ADV JOINT SHIELD) TABS Take 2 tablets by mouth daily.    . Nutritional Supplements (JUICE PLUS FIBRE) LIQD Take by mouth daily.    . Omega-3 Fatty Acids (OMEGA-3 FISH OIL) 1200 MG  CAPS Take by mouth daily.    Marland Kitchen omeprazole (PRILOSEC) 20 MG capsule TAKE 1 CAPSULE (20 MG TOTAL) BY MOUTH DAILY. 30 capsule 3  . SUMAtriptan (IMITREX) 100 MG tablet TAKE 1 TABLET BY MOUTH AS NEEDED FOR HEADACHE. MAY REPEAT IN 2 HOURS IF HEADACHE PERSISTS OR RECURS 10 tablet 3   No current facility-administered medications on file prior to visit.    No Known Allergies  Family History  Problem Relation Age of Onset  . Arthritis-Osteo Mother     Living  . Arthritis-Osteo Father     Living  . Diabetes Mother   . Diabetes Father   . Hyperlipidemia Mother   . Hypertension Mother   . Hypertension Father   . Heart attack Maternal Grandfather   . Heart attack Maternal Grandmother   . Alzheimer's disease Maternal Grandmother   . Pancreatic cancer Paternal Grandfather   . Dementia Paternal Grandmother   . Breast cancer Maternal Aunt   . Healthy Brother     x1  . Healthy Sister     x1  . Healthy Son     x2    Social History   Social History  . Marital Status: Married    Spouse Name: N/A  . Number of Children: N/A  . Years of Education: N/A  Occupational History  . Not on file.   Social History Main Topics  . Smoking status: Never Smoker   . Smokeless tobacco: Never Used  . Alcohol Use: 1.2 oz/week    2 Standard drinks or equivalent per week  . Drug Use: No  . Sexual Activity:    Partners: Male   Other Topics Concern  . Not on file   Social History Narrative   [place].   Review of Systems  Constitutional: Negative for fever and weight loss.  HENT: Negative for ear discharge, ear pain, hearing loss and tinnitus.   Eyes: Negative for blurred vision, double vision, photophobia and pain.  Respiratory: Negative for cough and shortness of breath.   Cardiovascular: Negative for chest pain and palpitations.  Gastrointestinal: Positive for heartburn. Negative for nausea, vomiting, abdominal pain, diarrhea, constipation, blood in stool and melena.  Genitourinary:  Negative for dysuria, urgency, frequency, hematuria and flank pain.  Musculoskeletal: Negative for falls.  Neurological: Negative for dizziness, loss of consciousness and headaches.  Endo/Heme/Allergies: Negative for environmental allergies.  Psychiatric/Behavioral: Positive for depression. Negative for suicidal ideas, hallucinations and substance abuse. The patient is not nervous/anxious and does not have insomnia.     BP 110/74 mmHg  Pulse 72  Temp(Src) 98.3 F (36.8 C) (Oral)  Resp 16  Ht 5\' 3"  (1.6 m)  Wt 165 lb 6 oz (75.014 kg)  BMI 29.30 kg/m2  SpO2 98%  Physical Exam  Constitutional: She is oriented to person, place, and time and well-developed, well-nourished, and in no distress.  HENT:  Head: Normocephalic and atraumatic.  Right Ear: Tympanic membrane, external ear and ear canal normal.  Left Ear: Tympanic membrane, external ear and ear canal normal.  Nose: Nose normal. No mucosal edema.  Mouth/Throat: Uvula is midline, oropharynx is clear and moist and mucous membranes are normal. No oropharyngeal exudate or posterior oropharyngeal erythema.  Eyes: Conjunctivae are normal. Pupils are equal, round, and reactive to light.  Neck: Neck supple. No thyromegaly present.  Cardiovascular: Normal rate, regular rhythm, normal heart sounds and intact distal pulses.   Pulmonary/Chest: Effort normal and breath sounds normal. No respiratory distress. She has no wheezes. She has no rales.  Abdominal: Soft. Bowel sounds are normal. She exhibits no distension and no mass. There is no tenderness. There is no rebound and no guarding.  Lymphadenopathy:    She has no cervical adenopathy.  Neurological: She is alert and oriented to person, place, and time. No cranial nerve deficit.  Skin: Skin is warm and dry. No rash noted.  Psychiatric: Affect normal.  Vitals reviewed.  Assessment/Plan: Visit for preventive health examination Depression screen negative. Health Maintenance reviewed --  up-to-date. Preventive schedule discussed and handout given in AVS. Will obtain fasting labs today.   Sleeping difficulties Much improved. Will increase Effexor to 150 mg to help better control mood. FU scheduled.  Generalized anxiety disorder Will increase Effexor to 150 mg daily. Continue Xanax PRN. FU 6 months.  Gastroesophageal reflux disease without esophagitis Much improved. Will continue current regimen. Discussed need to attempt to wean off of PPI in the near future.

## 2015-12-25 NOTE — Patient Instructions (Signed)
Please go to the lab for blood work. I will call you with your results.  Please continue chronic medications with the following exceptions:  1 -- start the new dose of the Effexor XR (150 mg) daily.  2 -- start the Celebrex once daily with food.  Call me in 3-4 weeks to let me know how you are doing on the new medications.  Follow-up with your Gynecologist as scheduled.  Preventive Care for Adults, Female A healthy lifestyle and preventive care can promote health and wellness. Preventive health guidelines for women include the following key practices.  A routine yearly physical is a good way to check with your health care provider about your health and preventive screening. It is a chance to share any concerns and updates on your health and to receive a thorough exam.  Visit your dentist for a routine exam and preventive care every 6 months. Brush your teeth twice a day and floss once a day. Good oral hygiene prevents tooth decay and gum disease.  The frequency of eye exams is based on your age, health, family medical history, use of contact lenses, and other factors. Follow your health care provider's recommendations for frequency of eye exams.  Eat a healthy diet. Foods like vegetables, fruits, whole grains, low-fat dairy products, and lean protein foods contain the nutrients you need without too many calories. Decrease your intake of foods high in solid fats, added sugars, and salt. Eat the right amount of calories for you.Get information about a proper diet from your health care provider, if necessary.  Regular physical exercise is one of the most important things you can do for your health. Most adults should get at least 150 minutes of moderate-intensity exercise (any activity that increases your heart rate and causes you to sweat) each week. In addition, most adults need muscle-strengthening exercises on 2 or more days a week.  Maintain a healthy weight. The body mass index (BMI) is a  screening tool to identify possible weight problems. It provides an estimate of body fat based on height and weight. Your health care provider can find your BMI and can help you achieve or maintain a healthy weight.For adults 20 years and older:  A BMI below 18.5 is considered underweight.  A BMI of 18.5 to 24.9 is normal.  A BMI of 25 to 29.9 is considered overweight.  A BMI of 30 and above is considered obese.  Maintain normal blood lipids and cholesterol levels by exercising and minimizing your intake of saturated fat. Eat a balanced diet with plenty of fruit and vegetables. Blood tests for lipids and cholesterol should begin at age 45 and be repeated every 5 years. If your lipid or cholesterol levels are high, you are over 50, or you are at high risk for heart disease, you may need your cholesterol levels checked more frequently.Ongoing high lipid and cholesterol levels should be treated with medicines if diet and exercise are not working.  If you smoke, find out from your health care provider how to quit. If you do not use tobacco, do not start.  Lung cancer screening is recommended for adults aged 32-80 years who are at high risk for developing lung cancer because of a history of smoking. A yearly low-dose CT scan of the lungs is recommended for people who have at least a 30-pack-year history of smoking and are a current smoker or have quit within the past 15 years. A pack year of smoking is smoking an average of  1 pack of cigarettes a day for 1 year (for example: 1 pack a day for 30 years or 2 packs a day for 15 years). Yearly screening should continue until the smoker has stopped smoking for at least 15 years. Yearly screening should be stopped for people who develop a health problem that would prevent them from having lung cancer treatment.  If you are pregnant, do not drink alcohol. If you are breastfeeding, be very cautious about drinking alcohol. If you are not pregnant and choose to  drink alcohol, do not have more than 1 drink per day. One drink is considered to be 12 ounces (355 mL) of beer, 5 ounces (148 mL) of wine, or 1.5 ounces (44 mL) of liquor.  Avoid use of street drugs. Do not share needles with anyone. Ask for help if you need support or instructions about stopping the use of drugs.  High blood pressure causes heart disease and increases the risk of stroke. Your blood pressure should be checked at least every 1 to 2 years. Ongoing high blood pressure should be treated with medicines if weight loss and exercise do not work.  If you are 72-42 years old, ask your health care provider if you should take aspirin to prevent strokes.  Diabetes screening is done by taking a blood sample to check your blood glucose level after you have not eaten for a certain period of time (fasting). If you are not overweight and you do not have risk factors for diabetes, you should be screened once every 3 years starting at age 2. If you are overweight or obese and you are 11-68 years of age, you should be screened for diabetes every year as part of your cardiovascular risk assessment.  Breast cancer screening is essential preventive care for women. You should practice "breast self-awareness." This means understanding the normal appearance and feel of your breasts and may include breast self-examination. Any changes detected, no matter how small, should be reported to a health care provider. Women in their 7s and 30s should have a clinical breast exam (CBE) by a health care provider as part of a regular health exam every 1 to 3 years. After age 51, women should have a CBE every year. Starting at age 23, women should consider having a mammogram (breast X-ray test) every year. Women who have a family history of breast cancer should talk to their health care provider about genetic screening. Women at a high risk of breast cancer should talk to their health care providers about having an MRI and a  mammogram every year.  Breast cancer gene (BRCA)-related cancer risk assessment is recommended for women who have family members with BRCA-related cancers. BRCA-related cancers include breast, ovarian, tubal, and peritoneal cancers. Having family members with these cancers may be associated with an increased risk for harmful changes (mutations) in the breast cancer genes BRCA1 and BRCA2. Results of the assessment will determine the need for genetic counseling and BRCA1 and BRCA2 testing.  Your health care provider may recommend that you be screened regularly for cancer of the pelvic organs (ovaries, uterus, and vagina). This screening involves a pelvic examination, including checking for microscopic changes to the surface of your cervix (Pap test). You may be encouraged to have this screening done every 3 years, beginning at age 36.  For women ages 44-65, health care providers may recommend pelvic exams and Pap testing every 3 years, or they may recommend the Pap and pelvic exam, combined with testing for  human papilloma virus (HPV), every 5 years. Some types of HPV increase your risk of cervical cancer. Testing for HPV may also be done on women of any age with unclear Pap test results.  Other health care providers may not recommend any screening for nonpregnant women who are considered low risk for pelvic cancer and who do not have symptoms. Ask your health care provider if a screening pelvic exam is right for you.  If you have had past treatment for cervical cancer or a condition that could lead to cancer, you need Pap tests and screening for cancer for at least 20 years after your treatment. If Pap tests have been discontinued, your risk factors (such as having a new sexual partner) need to be reassessed to determine if screening should resume. Some women have medical problems that increase the chance of getting cervical cancer. In these cases, your health care provider may recommend more frequent  screening and Pap tests.  Colorectal cancer can be detected and often prevented. Most routine colorectal cancer screening begins at the age of 31 years and continues through age 73 years. However, your health care provider may recommend screening at an earlier age if you have risk factors for colon cancer. On a yearly basis, your health care provider may provide home test kits to check for hidden blood in the stool. Use of a small camera at the end of a tube, to directly examine the colon (sigmoidoscopy or colonoscopy), can detect the earliest forms of colorectal cancer. Talk to your health care provider about this at age 87, when routine screening begins. Direct exam of the colon should be repeated every 5-10 years through age 73 years, unless early forms of precancerous polyps or small growths are found.  People who are at an increased risk for hepatitis B should be screened for this virus. You are considered at high risk for hepatitis B if:  You were born in a country where hepatitis B occurs often. Talk with your health care provider about which countries are considered high risk.  Your parents were born in a high-risk country and you have not received a shot to protect against hepatitis B (hepatitis B vaccine).  You have HIV or AIDS.  You use needles to inject street drugs.  You live with, or have sex with, someone who has hepatitis B.  You get hemodialysis treatment.  You take certain medicines for conditions like cancer, organ transplantation, and autoimmune conditions.  Hepatitis C blood testing is recommended for all people born from [birth year] through 1965 and any individual with known risks for hepatitis C.  Practice safe sex. Use condoms and avoid high-risk sexual practices to reduce the spread of sexually transmitted infections (STIs). STIs include gonorrhea, chlamydia, syphilis, trichomonas, herpes, HPV, and human immunodeficiency virus (HIV). Herpes, HIV, and HPV are viral illnesses  that have no cure. They can result in disability, cancer, and death.  You should be screened for sexually transmitted illnesses (STIs) including gonorrhea and chlamydia if:  You are sexually active and are younger than 24 years.  You are older than 24 years and your health care provider tells you that you are at risk for this type of infection.  Your sexual activity has changed since you were last screened and you are at an increased risk for chlamydia or gonorrhea. Ask your health care provider if you are at risk.  If you are at risk of being infected with HIV, it is recommended that you take a prescription  medicine daily to prevent HIV infection. This is called preexposure prophylaxis (PrEP). You are considered at risk if:  You are sexually active and do not regularly use condoms or know the HIV status of your partner(s).  You take drugs by injection.  You are sexually active with a partner who has HIV.  Talk with your health care provider about whether you are at high risk of being infected with HIV. If you choose to begin PrEP, you should first be tested for HIV. You should then be tested every 3 months for as long as you are taking PrEP.  Osteoporosis is a disease in which the bones lose minerals and strength with aging. This can result in serious bone fractures or breaks. The risk of osteoporosis can be identified using a bone density scan. Women ages 53 years and over and women at risk for fractures or osteoporosis should discuss screening with their health care providers. Ask your health care provider whether you should take a calcium supplement or vitamin D to reduce the rate of osteoporosis.  Menopause can be associated with physical symptoms and risks. Hormone replacement therapy is available to decrease symptoms and risks. You should talk to your health care provider about whether hormone replacement therapy is right for you.  Use sunscreen. Apply sunscreen liberally and  repeatedly throughout the day. You should seek shade when your shadow is shorter than you. Protect yourself by wearing long sleeves, pants, a wide-brimmed hat, and sunglasses year round, whenever you are outdoors.  Once a month, do a whole body skin exam, using a mirror to look at the skin on your back. Tell your health care provider of new moles, moles that have irregular borders, moles that are larger than a pencil eraser, or moles that have changed in shape or color.  Stay current with required vaccines (immunizations).  Influenza vaccine. All adults should be immunized every year.  Tetanus, diphtheria, and acellular pertussis (Td, Tdap) vaccine. Pregnant women should receive 1 dose of Tdap vaccine during each pregnancy. The dose should be obtained regardless of the length of time since the last dose. Immunization is preferred during the 27th-36th week of gestation. An adult who has not previously received Tdap or who does not know her vaccine status should receive 1 dose of Tdap. This initial dose should be followed by tetanus and diphtheria toxoids (Td) booster doses every 10 years. Adults with an unknown or incomplete history of completing a 3-dose immunization series with Td-containing vaccines should begin or complete a primary immunization series including a Tdap dose. Adults should receive a Td booster every 10 years.  Varicella vaccine. An adult without evidence of immunity to varicella should receive 2 doses or a second dose if she has previously received 1 dose. Pregnant females who do not have evidence of immunity should receive the first dose after pregnancy. This first dose should be obtained before leaving the health care facility. The second dose should be obtained 4-8 weeks after the first dose.  Human papillomavirus (HPV) vaccine. Females aged 13-26 years who have not received the vaccine previously should obtain the 3-dose series. The vaccine is not recommended for use in pregnant  females. However, pregnancy testing is not needed before receiving a dose. If a female is found to be pregnant after receiving a dose, no treatment is needed. In that case, the remaining doses should be delayed until after the pregnancy. Immunization is recommended for any person with an immunocompromised condition through the age of 45  years if she did not get any or all doses earlier. During the 3-dose series, the second dose should be obtained 4-8 weeks after the first dose. The third dose should be obtained 24 weeks after the first dose and 16 weeks after the second dose.  Zoster vaccine. One dose is recommended for adults aged 40 years or older unless certain conditions are present.  Measles, mumps, and rubella (MMR) vaccine. Adults born before [birth year] generally are considered immune to measles and mumps. Adults born in [birth year] or later should have 1 or more doses of MMR vaccine unless there is a contraindication to the vaccine or there is laboratory evidence of immunity to each of the three diseases. A routine second dose of MMR vaccine should be obtained at least 28 days after the first dose for students attending postsecondary schools, health care workers, or international travelers. People who received inactivated measles vaccine or an unknown type of measles vaccine during 1963-1967 should receive 2 doses of MMR vaccine. People who received inactivated mumps vaccine or an unknown type of mumps vaccine before 1979 and are at high risk for mumps infection should consider immunization with 2 doses of MMR vaccine. For females of childbearing age, rubella immunity should be determined. If there is no evidence of immunity, females who are not pregnant should be vaccinated. If there is no evidence of immunity, females who are pregnant should delay immunization until after pregnancy. Unvaccinated health care workers born before [birth year] who lack laboratory evidence of measles, mumps, or rubella immunity or laboratory  confirmation of disease should consider measles and mumps immunization with 2 doses of MMR vaccine or rubella immunization with 1 dose of MMR vaccine.  Pneumococcal 13-valent conjugate (PCV13) vaccine. When indicated, a person who is uncertain of his immunization history and has no record of immunization should receive the PCV13 vaccine. All adults 23 years of age and older should receive this vaccine. An adult aged 42 years or older who has certain medical conditions and has not been previously immunized should receive 1 dose of PCV13 vaccine. This PCV13 should be followed with a dose of pneumococcal polysaccharide (PPSV23) vaccine. Adults who are at high risk for pneumococcal disease should obtain the PPSV23 vaccine at least 8 weeks after the dose of PCV13 vaccine. Adults older than 60 years of age who have normal immune system function should obtain the PPSV23 vaccine dose at least 1 year after the dose of PCV13 vaccine.  Pneumococcal polysaccharide (PPSV23) vaccine. When PCV13 is also indicated, PCV13 should be obtained first. All adults aged 35 years and older should be immunized. An adult younger than age 78 years who has certain medical conditions should be immunized. Any person who resides in a nursing home or long-term care facility should be immunized. An adult smoker should be immunized. People with an immunocompromised condition and certain other conditions should receive both PCV13 and PPSV23 vaccines. People with human immunodeficiency virus (HIV) infection should be immunized as soon as possible after diagnosis. Immunization during chemotherapy or radiation therapy should be avoided. Routine use of PPSV23 vaccine is not recommended for American Indians, [place] Natives, or people younger than 65 years unless there are medical conditions that require PPSV23 vaccine. When indicated, people who have unknown immunization and have no record of immunization should receive PPSV23 vaccine. One-time  revaccination 5 years after the first dose of PPSV23 is recommended for people aged 19-64 years who have chronic kidney failure, nephrotic syndrome, asplenia, or immunocompromised conditions. People who received 1-2  doses of PPSV23 before age 39 years should receive another dose of PPSV23 vaccine at age 46 years or later if at least 5 years have passed since the previous dose. Doses of PPSV23 are not needed for people immunized with PPSV23 at or after age 59 years.  Meningococcal vaccine. Adults with asplenia or persistent complement component deficiencies should receive 2 doses of quadrivalent meningococcal conjugate (MenACWY-D) vaccine. The doses should be obtained at least 2 months apart. Microbiologists working with certain meningococcal bacteria, [employer] recruits, people at risk during an outbreak, and people who travel to or live in countries with a high rate of meningitis should be immunized. A first-year college student up through age 22 years who is living in a residence hall should receive a dose if she did not receive a dose on or after her 16th birthday. Adults who have certain high-risk conditions should receive one or more doses of vaccine.  Hepatitis A vaccine. Adults who wish to be protected from this disease, have certain high-risk conditions, work with hepatitis A-infected animals, work in hepatitis A research labs, or travel to or work in countries with a high rate of hepatitis A should be immunized. Adults who were previously unvaccinated and who anticipate close contact with an international adoptee during the first 60 days after arrival in the [place] from a country with a high rate of hepatitis A should be immunized.  Hepatitis B vaccine. Adults who wish to be protected from this disease, have certain high-risk conditions, may be exposed to blood or other infectious body fluids, are household contacts or sex partners of hepatitis B positive people, are clients or workers in  certain care facilities, or travel to or work in countries with a high rate of hepatitis B should be immunized.  Haemophilus influenzae type b (Hib) vaccine. A previously unvaccinated person with asplenia or sickle cell disease or having a scheduled splenectomy should receive 1 dose of Hib vaccine. Regardless of previous immunization, a recipient of a hematopoietic stem cell transplant should receive a 3-dose series 6-12 months after her successful transplant. Hib vaccine is not recommended for adults with HIV infection. Preventive Services / Frequency Ages 31 to 42 years  Blood pressure check.** / Every 3-5 years.  Lipid and cholesterol check.** / Every 5 years beginning at age 49.  Clinical breast exam.** / Every 3 years for women in their 5s and 53s.  BRCA-related cancer risk assessment.** / For women who have family members with a BRCA-related cancer (breast, ovarian, tubal, or peritoneal cancers).  Pap test.** / Every 2 years from ages 40 through 15. Every 3 years starting at age 15 through age 24 or 84 with a history of 3 consecutive normal Pap tests.  HPV screening.** / Every 3 years from ages 23 through ages 53 to 37 with a history of 3 consecutive normal Pap tests.  Hepatitis C blood test.** / For any individual with known risks for hepatitis C.  Skin self-exam. / Monthly.  Influenza vaccine. / Every year.  Tetanus, diphtheria, and acellular pertussis (Tdap, Td) vaccine.** / Consult your health care provider. Pregnant women should receive 1 dose of Tdap vaccine during each pregnancy. 1 dose of Td every 10 years.  Varicella vaccine.** / Consult your health care provider. Pregnant females who do not have evidence of immunity should receive the first dose after pregnancy.  HPV vaccine. / 3 doses over 6 months, if 26 and younger. The vaccine is not recommended for use in pregnant females. However,  pregnancy testing is not needed before receiving a dose.  Measles, mumps, rubella  (MMR) vaccine.** / You need at least 1 dose of MMR if you were born in 1957 or later. You may also need a 2nd dose. For females of childbearing age, rubella immunity should be determined. If there is no evidence of immunity, females who are not pregnant should be vaccinated. If there is no evidence of immunity, females who are pregnant should delay immunization until after pregnancy.  Pneumococcal 13-valent conjugate (PCV13) vaccine.** / Consult your health care provider.  Pneumococcal polysaccharide (PPSV23) vaccine.** / 1 to 2 doses if you smoke cigarettes or if you have certain conditions.  Meningococcal vaccine.** / 1 dose if you are age 23 to 16 years and a Market researcher living in a residence hall, or have one of several medical conditions, you need to get vaccinated against meningococcal disease. You may also need additional booster doses.  Hepatitis A vaccine.** / Consult your health care provider.  Hepatitis B vaccine.** / Consult your health care provider.  Haemophilus influenzae type b (Hib) vaccine.** / Consult your health care provider. Ages 12 to 67 years  Blood pressure check.** / Every year.  Lipid and cholesterol check.** / Every 5 years beginning at age 9 years.  Lung cancer screening. / Every year if you are aged 41-80 years and have a 30-pack-year history of smoking and currently smoke or have quit within the past 15 years. Yearly screening is stopped once you have quit smoking for at least 15 years or develop a health problem that would prevent you from having lung cancer treatment.  Clinical breast exam.** / Every year after age 71 years.  BRCA-related cancer risk assessment.** / For women who have family members with a BRCA-related cancer (breast, ovarian, tubal, or peritoneal cancers).  Mammogram.** / Every year beginning at age 74 years and continuing for as long as you are in good health. Consult with your health care provider.  Pap test.** / Every 3  years starting at age 79 years through age 4 or 15 years with a history of 3 consecutive normal Pap tests.  HPV screening.** / Every 3 years from ages 5 years through ages 56 to 23 years with a history of 3 consecutive normal Pap tests.  Fecal occult blood test (FOBT) of stool. / Every year beginning at age 35 years and continuing until age 34 years. You may not need to do this test if you get a colonoscopy every 10 years.  Flexible sigmoidoscopy or colonoscopy.** / Every 5 years for a flexible sigmoidoscopy or every 10 years for a colonoscopy beginning at age 11 years and continuing until age 38 years.  Hepatitis C blood test.** / For all people born from [birth year] through 1965 and any individual with known risks for hepatitis C.  Skin self-exam. / Monthly.  Influenza vaccine. / Every year.  Tetanus, diphtheria, and acellular pertussis (Tdap/Td) vaccine.** / Consult your health care provider. Pregnant women should receive 1 dose of Tdap vaccine during each pregnancy. 1 dose of Td every 10 years.  Varicella vaccine.** / Consult your health care provider. Pregnant females who do not have evidence of immunity should receive the first dose after pregnancy.  Zoster vaccine.** / 1 dose for adults aged 68 years or older.  Measles, mumps, rubella (MMR) vaccine.** / You need at least 1 dose of MMR if you were born in 1957 or later. You may also need a second dose. For females of childbearing  age, rubella immunity should be determined. If there is no evidence of immunity, females who are not pregnant should be vaccinated. If there is no evidence of immunity, females who are pregnant should delay immunization until after pregnancy.  Pneumococcal 13-valent conjugate (PCV13) vaccine.** / Consult your health care provider.  Pneumococcal polysaccharide (PPSV23) vaccine.** / 1 to 2 doses if you smoke cigarettes or if you have certain conditions.  Meningococcal vaccine.** / Consult your health care  provider.  Hepatitis A vaccine.** / Consult your health care provider.  Hepatitis B vaccine.** / Consult your health care provider.  Haemophilus influenzae type b (Hib) vaccine.** / Consult your health care provider. Ages 65 years and over  Blood pressure check.** / Every year.  Lipid and cholesterol check.** / Every 5 years beginning at age 48 years.  Lung cancer screening. / Every year if you are aged 12-80 years and have a 30-pack-year history of smoking and currently smoke or have quit within the past 15 years. Yearly screening is stopped once you have quit smoking for at least 15 years or develop a health problem that would prevent you from having lung cancer treatment.  Clinical breast exam.** / Every year after age 26 years.  BRCA-related cancer risk assessment.** / For women who have family members with a BRCA-related cancer (breast, ovarian, tubal, or peritoneal cancers).  Mammogram.** / Every year beginning at age 74 years and continuing for as long as you are in good health. Consult with your health care provider.  Pap test.** / Every 3 years starting at age 66 years through age 57 or 69 years with 3 consecutive normal Pap tests. Testing can be stopped between 65 and 70 years with 3 consecutive normal Pap tests and no abnormal Pap or HPV tests in the past 10 years.  HPV screening.** / Every 3 years from ages 56 years through ages 37 or 69 years with a history of 3 consecutive normal Pap tests. Testing can be stopped between 65 and 70 years with 3 consecutive normal Pap tests and no abnormal Pap or HPV tests in the past 10 years.  Fecal occult blood test (FOBT) of stool. / Every year beginning at age 66 years and continuing until age 64 years. You may not need to do this test if you get a colonoscopy every 10 years.  Flexible sigmoidoscopy or colonoscopy.** / Every 5 years for a flexible sigmoidoscopy or every 10 years for a colonoscopy beginning at age 25 years and continuing  until age 23 years.  Hepatitis C blood test.** / For all people born from [birth year] through 1965 and any individual with known risks for hepatitis C.  Osteoporosis screening.** / A one-time screening for women ages 48 years and over and women at risk for fractures or osteoporosis.  Skin self-exam. / Monthly.  Influenza vaccine. / Every year.  Tetanus, diphtheria, and acellular pertussis (Tdap/Td) vaccine.** / 1 dose of Td every 10 years.  Varicella vaccine.** / Consult your health care provider.  Zoster vaccine.** / 1 dose for adults aged 59 years or older.  Pneumococcal 13-valent conjugate (PCV13) vaccine.** / Consult your health care provider.  Pneumococcal polysaccharide (PPSV23) vaccine.** / 1 dose for all adults aged 58 years and older.  Meningococcal vaccine.** / Consult your health care provider.  Hepatitis A vaccine.** / Consult your health care provider.  Hepatitis B vaccine.** / Consult your health care provider.  Haemophilus influenzae type b (Hib) vaccine.** / Consult your health care provider. ** Family history and  personal history of risk and conditions may change your health care provider's recommendations.   This information is not intended to replace advice given to you by your health care provider. Make sure you discuss any questions you have with your health care provider.   Document Released: 09/29/2001 Document Revised: 08/24/2014 Document Reviewed: 12/29/2010 Elsevier Interactive Patient Education [employer].

## 2015-12-25 NOTE — Assessment & Plan Note (Signed)
Much improved. Will increase Effexor to 150 mg to help better control mood. FU scheduled.

## 2015-12-25 NOTE — Assessment & Plan Note (Signed)
Depression screen negative. Health Maintenance reviewed -- up-to-date. Preventive schedule discussed and handout given in AVS. Will obtain fasting labs today.  

## 2015-12-25 NOTE — Assessment & Plan Note (Signed)
Will increase Effexor to 150 mg daily. Continue Xanax PRN. FU 6 months.

## 2015-12-25 NOTE — Progress Notes (Signed)
Pre visit review using our clinic review tool, if applicable. No additional management support is needed unless otherwise documented below in the visit note/SLS  

## 2015-12-25 NOTE — Assessment & Plan Note (Signed)
>>  ASSESSMENT AND PLAN FOR SLEEPING DIFFICULTIES WRITTEN ON 12/25/2015  3:20 PM BY Marcelline Mates C, PA-C  Much improved. Will increase Effexor to 150 mg to help better control mood. FU scheduled.

## 2015-12-26 LAB — URINALYSIS, ROUTINE W REFLEX MICROSCOPIC
Bilirubin Urine: NEGATIVE
Hgb urine dipstick: NEGATIVE
Ketones, ur: NEGATIVE
Leukocytes, UA: NEGATIVE
Nitrite: NEGATIVE
PH: 5.5 (ref 5.0–8.0)
RBC / HPF: NONE SEEN (ref 0–?)
TOTAL PROTEIN, URINE-UPE24: NEGATIVE
Urine Glucose: NEGATIVE
Urobilinogen, UA: 0.2 (ref 0.0–1.0)
WBC, UA: NONE SEEN (ref 0–?)

## 2015-12-30 ENCOUNTER — Encounter: Payer: Self-pay | Admitting: Physician Assistant

## 2015-12-30 DIAGNOSIS — H40012 Open angle with borderline findings, low risk, left eye: Secondary | ICD-10-CM | POA: Diagnosis not present

## 2015-12-30 DIAGNOSIS — H5213 Myopia, bilateral: Secondary | ICD-10-CM | POA: Diagnosis not present

## 2015-12-30 DIAGNOSIS — H40011 Open angle with borderline findings, low risk, right eye: Secondary | ICD-10-CM | POA: Diagnosis not present

## 2016-01-01 ENCOUNTER — Telehealth: Payer: Self-pay | Admitting: Physician Assistant

## 2016-01-01 NOTE — Telephone Encounter (Signed)
See medication list. Need to start online PA for Celebrex RX. Please contact patient to let her know when this process is started and when we get a response. Thank you.

## 2016-01-06 NOTE — Telephone Encounter (Signed)
PA initiated on [email or web address], awaiting determination. JG//CMA 

## 2016-01-06 NOTE — Telephone Encounter (Signed)
PA approved effective 01/06/2016 through 08/16/2038. Called and informed pt. JG//CMA

## 2016-01-30 ENCOUNTER — Encounter: Payer: Self-pay | Admitting: Physician Assistant

## 2016-02-01 MED ORDER — FLUOXETINE HCL 40 MG PO CAPS: 40.0000 mg | ORAL_CAPSULE | Freq: Every day | ORAL | Status: DC

## 2016-03-03 ENCOUNTER — Other Ambulatory Visit: Payer: Self-pay | Admitting: Physician Assistant

## 2016-03-09 ENCOUNTER — Ambulatory Visit (INDEPENDENT_AMBULATORY_CARE_PROVIDER_SITE_OTHER): Payer: BLUE CROSS/BLUE SHIELD | Admitting: Physician Assistant

## 2016-03-09 ENCOUNTER — Encounter: Payer: Self-pay | Admitting: Physician Assistant

## 2016-03-09 VITALS — BP 118/76 | HR 88 | Temp 97.9°F | Resp 16 | Ht 63.0 in | Wt 167.4 lb

## 2016-03-09 DIAGNOSIS — E785 Hyperlipidemia, unspecified: Secondary | ICD-10-CM | POA: Insufficient documentation

## 2016-03-09 DIAGNOSIS — F411 Generalized anxiety disorder: Secondary | ICD-10-CM

## 2016-03-09 DIAGNOSIS — E782 Mixed hyperlipidemia: Secondary | ICD-10-CM | POA: Insufficient documentation

## 2016-03-09 MED ORDER — PITAVASTATIN CALCIUM 2 MG PO TABS: 2.0000 mg | ORAL_TABLET | Freq: Every day | ORAL | 2 refills | Status: DC

## 2016-03-09 MED ORDER — FLUOXETINE HCL 20 MG PO TABS: 20.0000 mg | ORAL_TABLET | Freq: Every day | ORAL | 3 refills | Status: DC

## 2016-03-09 NOTE — Assessment & Plan Note (Signed)
Will begin Livalo 2 mg daily. Voucher given. TLC reviewed. She will work harder on diet and exercise. FU scheduled.

## 2016-03-09 NOTE — Progress Notes (Signed)
Patient presents to clinic today for follow-up of anxiety after recent change in medication. Patient recently switched from her Effexor to 40 mg Prozac daily as Effexor felt to be sub-therapeutic. Is taking the Prozac as directed for the past 5 weeks. Endorses improvement in anxiety and depression but notes worsening sleep and agitation with the medication. Denies SI/HI.   Patient also following up regarding hyperlipidemia. LDL noted at 197 on recent labs with TGl elevated in the 300s. Patient elected to start TLC to improve cholesterol. Would not like to re-evaluate starting medication.  Past Medical History:  Diagnosis Date  . Basal cell adenocarcinoma   . Elevated cholesterol   . GERD without esophagitis   . Heartburn   . History of chicken pox   . Insomnia   . Migraine headache   . Osteoarthritis    Lumbar spine; hands; feet  . UTI (lower urinary tract infection)     Current Outpatient Prescriptions on File Prior to Visit  Medication Sig Dispense Refill  . ALPRAZolam (XANAX) 0.25 MG tablet Take 1 tablet (0.25 mg total) by mouth at bedtime as needed for anxiety. 30 tablet 2  . Ascorbic Acid (VITAMIN C) 100 MG tablet Take 100 mg by mouth daily.    . B Complex-C (SUPER B COMPLEX/VITAMIN C PO) Take by mouth daily.    . Biotin 1 MG CAPS Take by mouth daily.    . celecoxib (CELEBREX) 100 MG capsule Take 1 capsule (100 mg total) by mouth daily. 30 capsule 0  . cetirizine (ZYRTEC) 10 MG chewable tablet Chew 10 mg by mouth daily as needed for allergies.    . cholecalciferol (VITAMIN D) 400 UNITS TABS tablet Take 1 tablet (400 Units total) by mouth daily. 90 tablet 1  . conjugated estrogens (PREMARIN) vaginal cream Place 1 Applicatorful vaginally as directed. USE 2-3 TIMES PER WEEK.    . Misc Natural Products (OSTEO BI-FLEX ADV JOINT SHIELD) TABS Take 2 tablets by mouth daily.    . Nutritional Supplements (JUICE PLUS FIBRE) LIQD Take by mouth daily.    . Omega-3 Fatty Acids (OMEGA-3 FISH  OIL) 1200 MG CAPS Take by mouth daily.    Marland Kitchen omeprazole (PRILOSEC) 20 MG capsule TAKE 1 CAPSULE (20 MG TOTAL) BY MOUTH DAILY. 30 capsule 3  . SUMAtriptan (IMITREX) 100 MG tablet TAKE 1 TABLET BY MOUTH AS NEEDED FOR HEADACHE. MAY REPEAT IN 2 HOURS IF HEADACHE PERSISTS OR RECURS 10 tablet 3   No current facility-administered medications on file prior to visit.     No Known Allergies  Family History  Problem Relation Age of Onset  . Arthritis-Osteo Mother     Living  . Arthritis-Osteo Father     Living  . Diabetes Mother   . Diabetes Father   . Hyperlipidemia Mother   . Hypertension Mother   . Hypertension Father   . Heart attack Maternal Grandfather   . Heart attack Maternal Grandmother   . Alzheimer's disease Maternal Grandmother   . Pancreatic cancer Paternal Grandfather   . Dementia Paternal Grandmother   . Breast cancer Maternal Aunt   . Healthy Brother     x1  . Healthy Sister     x1  . Healthy Son     x2    Social History   Social History  . Marital status: Married    Spouse name: N/A  . Number of children: N/A  . Years of education: N/A   Social History Main Topics  . Smoking  status: Never Smoker  . Smokeless tobacco: Never Used  . Alcohol use 1.2 oz/week    2 Standard drinks or equivalent per week  . Drug use: No  . Sexual activity: Yes    Partners: Male   Other Topics Concern  . None   Social History Warehouse manager.   Review of Systems - See HPI.  All other ROS are negative.  BP 118/76 (BP Location: Right Arm, Patient Position: Sitting, Cuff Size: Large)   Pulse 88   Temp 97.9 F (36.6 C) (Oral)   Resp 16   Ht 5\' 3"  (1.6 m)   Wt 167 lb 6 oz (75.9 kg)   SpO2 96%   BMI 29.65 kg/m   Physical Exam  Constitutional: She is oriented to person, place, and time and well-developed, well-nourished, and in no distress.  HENT:  Head: Normocephalic and atraumatic.  Eyes: Conjunctivae are normal.  Cardiovascular: Normal rate, regular  rhythm, normal heart sounds and intact distal pulses.   Pulmonary/Chest: Effort normal and breath sounds normal.  Neurological: She is alert and oriented to person, place, and time.  Skin: Skin is warm and dry. No rash noted.  Psychiatric: Affect normal.  Vitals reviewed.  Recent Results (from the past 2160 hour(s))  CBC     Status: None   Collection Time: 12/25/15  3:06 PM  Result Value Ref Range   WBC 6.2 4.0 - 10.5 K/uL   RBC 4.81 3.87 - 5.11 Mil/uL   Platelets 307.0 150.0 - 400.0 K/uL   Hemoglobin 13.9 12.0 - 15.0 g/dL   HCT 41.2 36.0 - 46.0 %   MCV 85.5 78.0 - 100.0 fl   MCHC 33.8 30.0 - 36.0 g/dL   RDW 13.3 11.5 - 15.5 %  Comprehensive metabolic panel     Status: Abnormal   Collection Time: 12/25/15  3:06 PM  Result Value Ref Range   Sodium 137 135 - 145 mEq/L   Potassium 4.1 3.5 - 5.1 mEq/L   Chloride 101 96 - 112 mEq/L   CO2 28 19 - 32 mEq/L   Glucose, Bld 100 (H) 70 - 99 mg/dL   BUN 19 6 - 23 mg/dL   Creatinine, Ser 0.95 0.40 - 1.20 mg/dL   Total Bilirubin 0.4 0.2 - 1.2 mg/dL   Alkaline Phosphatase 77 39 - 117 U/L   AST 46 (H) 0 - 37 U/L   ALT 54 (H) 0 - 35 U/L   Total Protein 7.4 6.0 - 8.3 g/dL   Albumin 4.4 3.5 - 5.2 g/dL   Calcium 10.1 8.4 - 10.5 mg/dL   GFR 63.85 >60.00 mL/min  Hemoglobin A1c     Status: None   Collection Time: 12/25/15  3:06 PM  Result Value Ref Range   Hgb A1c MFr Bld 6.0 4.6 - 6.5 %    Comment: Glycemic Control Guidelines for People with Diabetes:Non Diabetic:  <6%Goal of Therapy: <7%Additional Action Suggested:  >8%   Lipid panel     Status: Abnormal   Collection Time: 12/25/15  3:06 PM  Result Value Ref Range   Cholesterol 310 (H) 0 - 200 mg/dL    Comment: ATP III Classification       Desirable:  < 200 mg/dL               Borderline High:  200 - 239 mg/dL          High:  > = 240 mg/dL   Triglycerides 378.0 (H) 0.0 - 149.0  mg/dL    Comment: Normal:  <150 mg/dLBorderline High:  150 - 199 mg/dL   HDL 59.60 >39.00 mg/dL   VLDL 75.6 (H)  0.0 - 40.0 mg/dL   Total CHOL/HDL Ratio 5     Comment:                Men          Women1/2 Average Risk     3.4          3.3Average Risk          5.0          4.42X Average Risk          9.6          7.13X Average Risk          15.0          11.0                       NonHDL 249.97     Comment: NOTE:  Non-HDL goal should be 30 mg/dL higher than patient's LDL goal (i.e. LDL goal of < 70 mg/dL, would have non-HDL goal of < 100 mg/dL)  TSH     Status: None   Collection Time: 12/25/15  3:06 PM  Result Value Ref Range   TSH 1.16 0.35 - 4.50 uIU/mL  Urinalysis, Routine w reflex microscopic (not at [facility])     Status: Abnormal   Collection Time: 12/25/15  3:06 PM  Result Value Ref Range   Color, Urine YELLOW Yellow;Lt. Yellow   APPearance CLEAR Clear   Specific Gravity, Urine <=1.005 (A) 1.000 - 1.030   pH 5.5 5.0 - 8.0   Total Protein, Urine NEGATIVE Negative   Urine Glucose NEGATIVE Negative   Ketones, ur NEGATIVE Negative   Bilirubin Urine NEGATIVE Negative   Hgb urine dipstick NEGATIVE Negative   Urobilinogen, UA 0.2 0.0 - 1.0   Leukocytes, UA NEGATIVE Negative   Nitrite NEGATIVE Negative   WBC, UA none seen 0-2/hpf   RBC / HPF none seen 0-2/hpf   Squamous Epithelial / LPF Rare(0-4/hpf) Rare(0-4/hpf)  LDL cholesterol, direct     Status: None   Collection Time: 12/25/15  3:06 PM  Result Value Ref Range   Direct LDL 197.0 mg/dL    Comment: Optimal:  <100 mg/dLNear or Above Optimal:  100-129 mg/dLBorderline High:  130-159 mg/dLHigh:  160-189 mg/dLVery High:  >190 mg/dL    Assessment/Plan: Generalized anxiety disorder Discussed options. Patient notes improvement in overall symptoms with Prozac but feels the dose may be too much for her. Will attempt trial of decreasing Prozac to 20 mg daily. Will FU 2 weeks. If no improvement in side effects or if anxiety is declining will need to switch medication, potentially sertraline.  Hyperlipidemia Will begin Livalo 2 mg daily. Voucher given.  TLC reviewed. She will work harder on diet and exercise. FU scheduled.    Leeanne Rio, PA-C

## 2016-03-09 NOTE — Assessment & Plan Note (Signed)
Discussed options. Patient notes improvement in overall symptoms with Prozac but feels the dose may be too much for her. Will attempt trial of decreasing Prozac to 20 mg daily. Will FU 2 weeks. If no improvement in side effects or if anxiety is declining will need to switch medication, potentially sertraline.

## 2016-03-09 NOTE — Patient Instructions (Signed)
Please decrease Prozac to 20 mg daily. I have sent in a new script for you.  See how this does over the next 2 weeks. Send me a message to let me know how you are doing.  Continue diet and exercise changes. Start the [place] as directed. We will recheck cholesterol in 3 months  Food Choices to Lower Your Triglycerides Triglycerides are a type of fat in your blood. High levels of triglycerides can increase the risk of heart disease and stroke. If your triglyceride levels are high, the foods you eat and your eating habits are very important. Choosing the right foods can help lower your triglycerides.  WHAT GENERAL GUIDELINES DO I NEED TO FOLLOW?  Lose weight if you are overweight.   Limit or avoid alcohol.   Fill one half of your plate with vegetables and green salads.   Limit fruit to two servings a day. Choose fruit instead of juice.   Make one fourth of your plate whole grains. Look for the word "whole" as the first word in the ingredient list.  Fill one fourth of your plate with lean protein foods.  Enjoy fatty fish (such as salmon, mackerel, sardines, and tuna) three times a week.   Choose healthy fats.   Limit foods high in starch and sugar.  Eat more home-cooked food and less restaurant, buffet, and fast food.  Limit fried foods.  Cook foods using methods other than frying.  Limit saturated fats.  Check ingredient lists to avoid foods with partially hydrogenated oils (trans fats) in them. WHAT FOODS CAN I EAT?  Grains Whole grains, such as whole wheat or whole grain breads, crackers, cereals, and pasta. Unsweetened oatmeal, bulgur, barley, quinoa, or brown rice. Corn or whole wheat flour tortillas.  Vegetables Fresh or frozen vegetables (raw, steamed, roasted, or grilled). Green salads. Fruits All fresh, canned (in natural juice), or frozen fruits. Meat and Other Protein Products Ground beef (85% or leaner), grass-fed beef, or beef trimmed of fat. Skinless  chicken or [place]. Ground chicken or [place]. Pork trimmed of fat. All fish and seafood. Eggs. Dried beans, peas, or lentils. Unsalted nuts or seeds. Unsalted canned or dry beans. Dairy Low-fat dairy products, such as skim or 1% milk, 2% or reduced-fat cheeses, low-fat ricotta or cottage cheese, or plain low-fat yogurt. Fats and Oils Tub margarines without trans fats. Light or reduced-fat mayonnaise and salad dressings. Avocado. Safflower, olive, or canola oils. Natural peanut or almond butter. The items listed above may not be a complete list of recommended foods or beverages. Contact your dietitian for more options. WHAT FOODS ARE NOT RECOMMENDED?  Grains White bread. White pasta. White rice. Cornbread. Bagels, pastries, and croissants. Crackers that contain trans fat. Vegetables White potatoes. Corn. Creamed or fried vegetables. Vegetables in a cheese sauce. Fruits Dried fruits. Canned fruit in light or heavy syrup. Fruit juice. Meat and Other Protein Products Fatty cuts of meat. Ribs, chicken wings, bacon, sausage, bologna, salami, chitterlings, fatback, hot dogs, bratwurst, and packaged luncheon meats. Dairy Whole or 2% milk, cream, half-and-half, and cream cheese. Whole-fat or sweetened yogurt. Full-fat cheeses. Nondairy creamers and whipped toppings. Processed cheese, cheese spreads, or cheese curds. Sweets and Desserts Corn syrup, sugars, honey, and molasses. Candy. Jam and jelly. Syrup. Sweetened cereals. Cookies, pies, cakes, donuts, muffins, and ice cream. Fats and Oils Butter, stick margarine, lard, shortening, ghee, or bacon fat. Coconut, palm kernel, or palm oils. Beverages Alcohol. Sweetened drinks (such as sodas, lemonade, and fruit drinks or punches). The  items listed above may not be a complete list of foods and beverages to avoid. Contact your dietitian for more information.   This information is not intended to replace advice given to you by your health care provider. Make  sure you discuss any questions you have with your health care provider.   Document Released: 05/21/2004 Document Revised: 08/24/2014 Document Reviewed: 06/07/2013 Elsevier Interactive Patient Education [employer].

## 2016-03-10 ENCOUNTER — Telehealth: Payer: Self-pay

## 2016-03-10 MED ORDER — SIMVASTATIN 20 MG PO TABS: 20.0000 mg | ORAL_TABLET | Freq: Every day | ORAL | 2 refills | Status: DC

## 2016-03-10 NOTE — Telephone Encounter (Signed)
PA form for Livalo 2mg  tablet received from [facility]. Spoke w/ Einar Pheasant, okay to change to Simvastatin 20 mg 1 tab daily at bedtime, #30 and 2RF and F/U in 3 months.

## 2016-03-10 NOTE — Telephone Encounter (Signed)
Spoke w/ Pt, informed her of med change. Pt verbalized understanding and will schedule F/U appt via MyChart.

## 2016-05-25 ENCOUNTER — Encounter: Payer: Self-pay | Admitting: Physician Assistant

## 2016-05-25 DIAGNOSIS — R319 Hematuria, unspecified: Secondary | ICD-10-CM | POA: Diagnosis not present

## 2016-05-25 DIAGNOSIS — Z130 Encounter for screening for diseases of the blood and blood-forming organs and certain disorders involving the immune mechanism: Secondary | ICD-10-CM | POA: Diagnosis not present

## 2016-05-25 DIAGNOSIS — Z6829 Body mass index (BMI) 29.0-29.9, adult: Secondary | ICD-10-CM | POA: Diagnosis not present

## 2016-05-25 DIAGNOSIS — Z01419 Encounter for gynecological examination (general) (routine) without abnormal findings: Secondary | ICD-10-CM | POA: Diagnosis not present

## 2016-05-25 DIAGNOSIS — E663 Overweight: Secondary | ICD-10-CM | POA: Diagnosis not present

## 2016-05-25 DIAGNOSIS — Z1231 Encounter for screening mammogram for malignant neoplasm of breast: Secondary | ICD-10-CM | POA: Diagnosis not present

## 2016-05-25 DIAGNOSIS — Z1389 Encounter for screening for other disorder: Secondary | ICD-10-CM | POA: Diagnosis not present

## 2016-05-30 ENCOUNTER — Other Ambulatory Visit: Payer: Self-pay | Admitting: Physician Assistant

## 2016-06-08 ENCOUNTER — Other Ambulatory Visit: Payer: Self-pay | Admitting: Obstetrics and Gynecology

## 2016-06-08 DIAGNOSIS — R928 Other abnormal and inconclusive findings on diagnostic imaging of breast: Secondary | ICD-10-CM

## 2016-06-12 ENCOUNTER — Ambulatory Visit
Admission: RE | Admit: 2016-06-12 | Discharge: 2016-06-12 | Disposition: A | Discharge status: Home / Self Care | Payer: BLUE CROSS/BLUE SHIELD | Source: Ambulatory Visit | Attending: Obstetrics and Gynecology | Admitting: Obstetrics and Gynecology

## 2016-06-12 ENCOUNTER — Other Ambulatory Visit: Payer: Self-pay | Admitting: Obstetrics and Gynecology

## 2016-06-12 DIAGNOSIS — R928 Other abnormal and inconclusive findings on diagnostic imaging of breast: Secondary | ICD-10-CM

## 2016-06-12 DIAGNOSIS — N6310 Unspecified lump in the right breast, unspecified quadrant: Secondary | ICD-10-CM | POA: Diagnosis not present

## 2016-06-12 DIAGNOSIS — N63 Unspecified lump in unspecified breast: Secondary | ICD-10-CM

## 2016-06-15 ENCOUNTER — Telehealth: Payer: Self-pay | Admitting: Physician Assistant

## 2016-06-15 NOTE — Telephone Encounter (Signed)
No problem with me. Thanks, BJ

## 2016-06-15 NOTE — Telephone Encounter (Signed)
Called pt to let her know of the news of transfer she will call back when she is ready to schedule.

## 2016-06-15 NOTE — Telephone Encounter (Signed)
Pt would like to transfer from [place] (Emmaline Kluver) to [place] (Dr. Betty Martinique) pt state that her provider is moving to [place] and that the [place] office is closer to her.  Is it okay to transfer?

## 2016-06-15 NOTE — Telephone Encounter (Signed)
Ok with me 

## 2016-06-17 ENCOUNTER — Ambulatory Visit (INDEPENDENT_AMBULATORY_CARE_PROVIDER_SITE_OTHER): Payer: BLUE CROSS/BLUE SHIELD | Admitting: Physician Assistant

## 2016-06-17 ENCOUNTER — Encounter: Payer: Self-pay | Admitting: Physician Assistant

## 2016-06-17 ENCOUNTER — Ambulatory Visit
Admission: RE | Admit: 2016-06-17 | Discharge: 2016-06-17 | Disposition: A | Discharge status: Home / Self Care | Payer: BLUE CROSS/BLUE SHIELD | Source: Ambulatory Visit | Attending: Obstetrics and Gynecology | Admitting: Obstetrics and Gynecology

## 2016-06-17 VITALS — BP 118/76 | HR 85 | Temp 97.8°F | Ht 63.0 in | Wt 170.2 lb

## 2016-06-17 DIAGNOSIS — Z23 Encounter for immunization: Secondary | ICD-10-CM | POA: Diagnosis not present

## 2016-06-17 DIAGNOSIS — N6011 Diffuse cystic mastopathy of right breast: Secondary | ICD-10-CM | POA: Diagnosis not present

## 2016-06-17 DIAGNOSIS — G479 Sleep disorder, unspecified: Secondary | ICD-10-CM

## 2016-06-17 DIAGNOSIS — N63 Unspecified lump in unspecified breast: Secondary | ICD-10-CM

## 2016-06-17 DIAGNOSIS — N6310 Unspecified lump in the right breast, unspecified quadrant: Secondary | ICD-10-CM | POA: Diagnosis not present

## 2016-06-17 DIAGNOSIS — F411 Generalized anxiety disorder: Secondary | ICD-10-CM | POA: Diagnosis not present

## 2016-06-17 DIAGNOSIS — E785 Hyperlipidemia, unspecified: Secondary | ICD-10-CM

## 2016-06-17 LAB — LIPID PANEL
Cholesterol: 227 mg/dL — ABNORMAL HIGH (ref 0–200)
HDL: 67.3 mg/dL (ref >39.00)
NONHDL: 159.88
Total CHOL/HDL Ratio: 3
Triglycerides: 339 mg/dL — ABNORMAL HIGH (ref 0.0–149.0)
VLDL: 67.8 mg/dL — ABNORMAL HIGH (ref 0.0–40.0)

## 2016-06-17 LAB — HEPATIC FUNCTION PANEL
ALT: 25 U/L (ref 0–35)
AST: 24 U/L (ref 0–37)
Albumin: 4.4 g/dL (ref 3.5–5.2)
Alkaline Phosphatase: 72 U/L (ref 39–117)
BILIRUBIN DIRECT: 0.1 mg/dL (ref 0.0–0.3)
BILIRUBIN TOTAL: 0.4 mg/dL (ref 0.2–1.2)
Total Protein: 7.4 g/dL (ref 6.0–8.3)

## 2016-06-17 LAB — LDL CHOLESTEROL, DIRECT: LDL DIRECT: 141 mg/dL

## 2016-06-17 MED ORDER — SIMVASTATIN 20 MG PO TABS: 20.0000 mg | ORAL_TABLET | Freq: Every day | ORAL | 5 refills | Status: DC

## 2016-06-17 MED ORDER — OMEPRAZOLE 20 MG PO CPDR: DELAYED_RELEASE_CAPSULE | ORAL | 3 refills | Status: DC

## 2016-06-17 MED ORDER — FLUOXETINE HCL 20 MG PO TABS: 20.0000 mg | ORAL_TABLET | Freq: Every day | ORAL | 3 refills | Status: DC

## 2016-06-17 MED ORDER — ALPRAZOLAM 0.25 MG PO TABS: 0.2500 mg | ORAL_TABLET | Freq: Every evening | ORAL | 2 refills | Status: DC | PRN

## 2016-06-17 NOTE — Progress Notes (Signed)
Patient presents to clinic today for follow-up of hyperlipidemia. Patient is currently on a regimen on Simvastatin 20 mg daily as Livalo no longer covered by insurance. Patient endorses taking daily as directed without side effect. Is eating a well balanced diet. Trying to stay active.    Patient also wanting to discuss trial or Prozac 30 mg daily. Is currently on 20 mg daily with improvement in mood and anxiety. Is still having increased irritability. Denies panic attack, SI/HI.  Past Medical History:  Diagnosis Date  . Basal cell adenocarcinoma   . Elevated cholesterol   . GERD without esophagitis   . Heartburn   . History of chicken pox   . Insomnia   . Migraine headache   . Osteoarthritis    Lumbar spine; hands; feet  . UTI (lower urinary tract infection)     Current Outpatient Prescriptions on File Prior to Visit  Medication Sig Dispense Refill  . ALPRAZolam (XANAX) 0.25 MG tablet Take 1 tablet (0.25 mg total) by mouth at bedtime as needed for anxiety. 30 tablet 2  . Ascorbic Acid (VITAMIN C) 100 MG tablet Take 100 mg by mouth daily.    . B Complex-C (SUPER B COMPLEX/VITAMIN C PO) Take by mouth daily.    . Biotin 1 MG CAPS Take by mouth daily.    . celecoxib (CELEBREX) 100 MG capsule Take 1 capsule (100 mg total) by mouth daily. 30 capsule 0  . cetirizine (ZYRTEC) 10 MG chewable tablet Chew 10 mg by mouth daily as needed for allergies.    . cholecalciferol (VITAMIN D) 400 UNITS TABS tablet Take 1 tablet (400 Units total) by mouth daily. 90 tablet 1  . conjugated estrogens (PREMARIN) vaginal cream Place 1 Applicatorful vaginally as directed. USE 2-3 TIMES PER WEEK.    Marland Kitchen FLUoxetine (PROZAC) 20 MG tablet TAKE 1 TABLET (20 MG TOTAL) BY MOUTH DAILY. 30 tablet 3  . Misc Natural Products (OSTEO BI-FLEX ADV JOINT SHIELD) TABS Take 2 tablets by mouth daily.    . Nutritional Supplements (JUICE PLUS FIBRE) LIQD Take by mouth daily.    . Omega-3 Fatty Acids (OMEGA-3 FISH OIL) 1200 MG CAPS  Take by mouth daily.    Marland Kitchen omeprazole (PRILOSEC) 20 MG capsule TAKE 1 CAPSULE (20 MG TOTAL) BY MOUTH DAILY. 30 capsule 3  . simvastatin (ZOCOR) 20 MG tablet Take 1 tablet (20 mg total) by mouth at bedtime. 30 tablet 2  . SUMAtriptan (IMITREX) 100 MG tablet TAKE 1 TABLET BY MOUTH AS NEEDED FOR HEADACHE. MAY REPEAT IN 2 HOURS IF HEADACHE PERSISTS OR RECURS 10 tablet 3   No current facility-administered medications on file prior to visit.     No Known Allergies  Family History  Problem Relation Age of Onset  . Arthritis-Osteo Mother     Living  . Arthritis-Osteo Father     Living  . Diabetes Mother   . Diabetes Father   . Hyperlipidemia Mother   . Hypertension Mother   . Hypertension Father   . Heart attack Maternal Grandfather   . Heart attack Maternal Grandmother   . Alzheimer's disease Maternal Grandmother   . Pancreatic cancer Paternal Grandfather   . Dementia Paternal Grandmother   . Breast cancer Maternal Aunt   . Healthy Brother     x1  . Healthy Sister     x1  . Healthy Son     x2    Social History   Social History  . Marital status: Married  Spouse name: N/A  . Number of children: N/A  . Years of education: N/A   Social History Main Topics  . Smoking status: Never Smoker  . Smokeless tobacco: Never Used  . Alcohol use 1.2 oz/week    2 Standard drinks or equivalent per week  . Drug use: No  . Sexual activity: Yes    Partners: Male   Other Topics Concern  . None   Social History Warehouse manager.   Review of Systems - See HPI.  All other ROS are negative.  BP 118/76   Pulse 85   Temp 97.8 F (36.6 C) (Oral)   Ht 5\' 3"  (1.6 m)   Wt 170 lb 3.2 oz (77.2 kg)   SpO2 99%   BMI 30.15 kg/m   Physical Exam  Constitutional: She is oriented to person, place, and time and well-developed, well-nourished, and in no distress.  HENT:  Head: Normocephalic and atraumatic.  Eyes: Conjunctivae are normal.  Cardiovascular: Normal rate, regular  rhythm, normal heart sounds and intact distal pulses.   Pulmonary/Chest: Effort normal and breath sounds normal. No respiratory distress. She has no wheezes. She has no rales. She exhibits no tenderness.  Neurological: She is alert and oriented to person, place, and time.  Skin: Skin is warm and dry. No rash noted.  Psychiatric: Affect normal.  Vitals reviewed.  Assessment/Plan: 1. Hyperlipidemia, unspecified hyperlipidemia type Tolerating statin well. Will check Lipid panel and LFT today. Will later regimen based on results. Increase aerobic exercise to promote weight loss. - Lipid Profile - Hepatic function panel  2. Generalized anxiety disorder Will attempt trial of 1.5 tablets of Fluoxetine (30 mg) over 2 weeks. If working well will send in new scripts.   3. Sleeping difficulties Medication refilled. - ALPRAZolam (XANAX) 0.25 MG tablet; Take 1 tablet (0.25 mg total) by mouth at bedtime as needed for anxiety.  Dispense: 30 tablet; Refill: 2   Leeanne Rio, PA-C

## 2016-06-17 NOTE — Patient Instructions (Signed)
Please go to the lab for blood work.  I will call you with your results. Please try to take 1.5 tablets of the Fluoxetine over the next 2 weeks. Message me and let me know how this is working for you.  Continue well-balanced diet. Try to get in at least 150 minutes of aerobic exercise per week.   Follow-up with Dr. Martinique as scheduled.

## 2016-06-18 ENCOUNTER — Encounter: Payer: Self-pay | Admitting: Physician Assistant

## 2016-07-06 ENCOUNTER — Other Ambulatory Visit: Payer: Self-pay | Admitting: *Deleted

## 2016-07-06 MED ORDER — FLUOXETINE HCL 20 MG PO CAPS: 20.0000 mg | ORAL_CAPSULE | Freq: Every day | ORAL | 0 refills | Status: DC

## 2016-07-06 NOTE — Progress Notes (Signed)
90-day supply and capsules requested for cost savings by patient/pharmacy/SLS 11/20

## 2016-07-17 DIAGNOSIS — Z85828 Personal history of other malignant neoplasm of skin: Secondary | ICD-10-CM | POA: Diagnosis not present

## 2016-07-17 DIAGNOSIS — D2262 Melanocytic nevi of left upper limb, including shoulder: Secondary | ICD-10-CM | POA: Diagnosis not present

## 2016-07-17 DIAGNOSIS — L814 Other melanin hyperpigmentation: Secondary | ICD-10-CM | POA: Diagnosis not present

## 2016-07-17 DIAGNOSIS — L570 Actinic keratosis: Secondary | ICD-10-CM | POA: Diagnosis not present

## 2016-09-26 ENCOUNTER — Other Ambulatory Visit: Payer: Self-pay | Admitting: Physician Assistant

## 2016-09-28 NOTE — Telephone Encounter (Signed)
Noted. 30-day supply of 20 mg capsules sent.

## 2016-09-28 NOTE — Telephone Encounter (Signed)
Last ov: 06/17/16 Increased Fluoxetine to 1.5 tab daily last ov Please advise of refill

## 2016-09-28 NOTE — Telephone Encounter (Signed)
Please call to assess current dose.  She needs follow-up. Ok to give 30 days supply until her FU visit.

## 2016-09-28 NOTE — Telephone Encounter (Signed)
Spoke with patient and she did increase the Prozac to 30 mg for 4 weeks but she didn't feel any better so she went back to the 20 mg. She states the capsules are cheaper than the tablets. She is agreeable with 30 day supply and will schedule a follow up appointment

## 2016-10-07 DIAGNOSIS — C44519 Basal cell carcinoma of skin of other part of trunk: Secondary | ICD-10-CM | POA: Diagnosis not present

## 2016-10-07 DIAGNOSIS — L570 Actinic keratosis: Secondary | ICD-10-CM | POA: Diagnosis not present

## 2016-10-07 DIAGNOSIS — C44619 Basal cell carcinoma of skin of left upper limb, including shoulder: Secondary | ICD-10-CM | POA: Diagnosis not present

## 2016-10-25 NOTE — Progress Notes (Deleted)
HPI:   [name] is a 61 y.o. female, who is here today to establish care.  Former PCP: Kathryn Noble, PA Last preventive routine visit: 12/2015.  Chronic medical problems: HLD, anxiety,GERD,migraine,and insomnia among some.   Hyperlipidemia:  Currently on Zocor Following a low fat diet: ***.  *** has not noted side effects with medication.  Lab Results  Component Value Date   CHOL 227 (H) 06/17/2016   HDL 67.30 06/17/2016   LDLDIRECT 141.0 06/17/2016   TRIG 339.0 (H) 06/17/2016   CHOLHDL 3 06/17/2016    Anxiety and insomnia:  Currently on Prozac 20 mg daily and Xanax 0.25 mg at bedtime. *** She denies suicidal thoughts.  Migraine headaches: Dx *** On Imitrex prn. Frequency: *** Visual aura or preceding symptoms:*** Localization: ***    Concerns today: ***     Review of Systems    Current Outpatient Prescriptions on File Prior to Visit  Medication Sig Dispense Refill  . ALPRAZolam (XANAX) 0.25 MG tablet Take 1 tablet (0.25 mg total) by mouth at bedtime as needed for anxiety. 30 tablet 2  . Ascorbic Acid (VITAMIN C) 100 MG tablet Take 100 mg by mouth daily.    . B Complex-C (SUPER B COMPLEX/VITAMIN C PO) Take by mouth daily.    . Biotin 1 MG CAPS Take by mouth daily.    . celecoxib (CELEBREX) 100 MG capsule Take 1 capsule (100 mg total) by mouth daily. 30 capsule 0  . cetirizine (ZYRTEC) 10 MG chewable tablet Chew 10 mg by mouth daily as needed for allergies.    . cholecalciferol (VITAMIN D) 400 UNITS TABS tablet Take 1 tablet (400 Units total) by mouth daily. 90 tablet 1  . conjugated estrogens (PREMARIN) vaginal cream Place 1 Applicatorful vaginally as directed. USE 2-3 TIMES PER WEEK.    Marland Kitchen FLUoxetine (PROZAC) 20 MG capsule TAKE 1 CAPSULE (20 MG TOTAL) BY MOUTH DAILY. 30 capsule 0  . Misc Natural Products (OSTEO BI-FLEX ADV JOINT SHIELD) TABS Take 2 tablets by mouth daily.    . Nutritional Supplements (JUICE PLUS FIBRE) LIQD Take by mouth  daily.    . Omega-3 Fatty Acids (OMEGA-3 FISH OIL) 1200 MG CAPS Take by mouth daily.    Marland Kitchen omeprazole (PRILOSEC) 20 MG capsule TAKE 1 CAPSULE (20 MG TOTAL) BY MOUTH DAILY. 30 capsule 3  . simvastatin (ZOCOR) 20 MG tablet Take 1 tablet (20 mg total) by mouth at bedtime. 30 tablet 5  . SUMAtriptan (IMITREX) 100 MG tablet TAKE 1 TABLET BY MOUTH AS NEEDED FOR HEADACHE. MAY REPEAT IN 2 HOURS IF HEADACHE PERSISTS OR RECURS 10 tablet 3   No current facility-administered medications on file prior to visit.      Past Medical History:  Diagnosis Date  . Basal cell adenocarcinoma   . Elevated cholesterol   . GERD without esophagitis   . Heartburn   . History of chicken pox   . Insomnia   . Migraine headache   . Osteoarthritis    Lumbar spine; hands; feet  . UTI (lower urinary tract infection)    No Known Allergies  Family History  Problem Relation Age of Onset  . Arthritis-Osteo Mother     Living  . Arthritis-Osteo Father     Living  . Diabetes Mother   . Diabetes Father   . Hyperlipidemia Mother   . Hypertension Mother   . Hypertension Father   . Heart attack Maternal Grandfather   . Heart attack Maternal Grandmother   .  Alzheimer's disease Maternal Grandmother   . Pancreatic cancer Paternal Grandfather   . Dementia Paternal Grandmother   . Breast cancer Maternal Aunt   . Healthy Brother     x1  . Healthy Sister     x1  . Healthy Son     x2    Social History   Social History  . Marital status: Married    Spouse name: N/A  . Number of children: N/A  . Years of education: N/A   Social History Main Topics  . Smoking status: Never Smoker  . Smokeless tobacco: Never Used  . Alcohol use 1.2 oz/week    2 Standard drinks or equivalent per week  . Drug use: No  . Sexual activity: Yes    Partners: Male   Other Topics Concern  . Not on file   Social History Narrative   [place].    There were no vitals filed for this visit.  There is no height or weight  on file to calculate BMI.      Physical Exam    ASSESSMENT AND PLAN:     There are no diagnoses linked to this encounter.              Goble Fudala G. Martinique, MD  [facility]. [place] office.

## 2016-10-26 ENCOUNTER — Encounter: Payer: Self-pay | Admitting: Family Medicine

## 2016-10-26 ENCOUNTER — Ambulatory Visit (INDEPENDENT_AMBULATORY_CARE_PROVIDER_SITE_OTHER): Payer: BLUE CROSS/BLUE SHIELD | Admitting: Family Medicine

## 2016-10-26 ENCOUNTER — Ambulatory Visit: Payer: Self-pay | Admitting: Family Medicine

## 2016-10-26 VITALS — BP 110/78 | HR 74 | Resp 12 | Ht 63.0 in | Wt 166.6 lb

## 2016-10-26 DIAGNOSIS — E782 Mixed hyperlipidemia: Secondary | ICD-10-CM

## 2016-10-26 DIAGNOSIS — G43009 Migraine without aura, not intractable, without status migrainosus: Secondary | ICD-10-CM | POA: Diagnosis not present

## 2016-10-26 DIAGNOSIS — F411 Generalized anxiety disorder: Secondary | ICD-10-CM | POA: Diagnosis not present

## 2016-10-26 DIAGNOSIS — R7303 Prediabetes: Secondary | ICD-10-CM

## 2016-10-26 MED ORDER — FLUOXETINE HCL 20 MG PO CAPS: 20.0000 mg | ORAL_CAPSULE | Freq: Every day | ORAL | 2 refills | Status: DC

## 2016-10-26 NOTE — Progress Notes (Signed)
HPI:   Kathryn Cummings is a 61 y.o. female, who is here today to establish care.  Former PCP: Kathryn Noble, PA. He moved to [place] and it is easier for her to come to [place].  Last preventive routine visit: 12/2015.  Chronic medical problems: HLD, anxiety,GERD,migraine,and insomnia among some.   Hyperlipidemia:  Currently on Zocor 20 mg, started about 7-8 months ago. Following a low fat diet: Yes. She also exercises regularly,walking.  She has not noted side effects with medication.  Lab Results  Component Value Date   CHOL 227 (H) 06/17/2016   HDL 67.30 06/17/2016   LDLDIRECT 141.0 06/17/2016   TRIG 339.0 (H) 06/17/2016   CHOLHDL 3 06/17/2016   She has had mildly elevated transaminases in the past. 12/2015 AST 46 and ALT 54.  Lab Results  Component Value Date   ALT 25 06/17/2016   AST 24 06/17/2016   ALKPHOS 72 06/17/2016   BILITOT 0.4 06/17/2016     Anxiety and insomnia:  Currently on Prozac 20 mg daily and Xanax 0.25 mg at bedtime. She has been on Xanax for about a year.She takes it as needed,usually when she wakes up in the middle of the night and not able to go back to sleep.This happens about 1-2 times per week,she is able to sleep about 4 more hours. She denies side effects.  Prozac 8-9 months. She has tried other medications but did not seem to help.  She denies suicidal thoughts.  Migraine headaches: Dx about 40 years ago. Progressively getting better as she gets older. On Imitrex prn. Frequency: About 5 times per year. Visual aura or preceding symptoms: None Localization: Left temporal,sharp.  Exacerbated by drastic weather changes. + Nausea and photophobia,no vomiting.    Concerns today: She would like also to screen for diabetes,her parents have DM II.   Lab Results  Component Value Date   HGBA1C 6.0 12/25/2015    Review of Systems  Constitutional: Negative for activity change, appetite change, fatigue and unexpected  weight change.  HENT: Negative for mouth sores, nosebleeds and trouble swallowing.   Eyes: Negative for redness and visual disturbance.  Respiratory: Negative for cough, shortness of breath and wheezing.   Cardiovascular: Negative for chest pain, palpitations and leg swelling.  Gastrointestinal: Negative for abdominal pain, nausea and vomiting.       Negative for changes in bowel habits.  Endocrine: Negative for polydipsia, polyphagia and polyuria.  Genitourinary: Negative for decreased urine volume and hematuria.  Musculoskeletal: Positive for arthralgias (hands and feet,Hx of OA. Takes Celebrex as needed.). Negative for gait problem.  Skin: Negative for rash.  Neurological: Positive for headaches. Negative for syncope, weakness and numbness.  Psychiatric/Behavioral: Positive for sleep disturbance. Negative for confusion and suicidal ideas. The patient is nervous/anxious.       Current Outpatient Prescriptions on File Prior to Visit  Medication Sig Dispense Refill  . ALPRAZolam (XANAX) 0.25 MG tablet Take 1 tablet (0.25 mg total) by mouth at bedtime as needed for anxiety. 30 tablet 2  . Ascorbic Acid (VITAMIN C) 100 MG tablet Take 100 mg by mouth daily.    . B Complex-C (SUPER B COMPLEX/VITAMIN C PO) Take by mouth daily.    . Biotin 1 MG CAPS Take by mouth daily.    . celecoxib (CELEBREX) 100 MG capsule Take 1 capsule (100 mg total) by mouth daily. 30 capsule 0  . cetirizine (ZYRTEC) 10 MG chewable tablet Chew 10 mg by mouth daily as needed  for allergies.    . cholecalciferol (VITAMIN D) 400 UNITS TABS tablet Take 1 tablet (400 Units total) by mouth daily. 90 tablet 1  . Misc Natural Products (OSTEO BI-FLEX ADV JOINT SHIELD) TABS Take 2 tablets by mouth daily.    . Nutritional Supplements (JUICE PLUS FIBRE) LIQD Take by mouth daily.    . Omega-3 Fatty Acids (OMEGA-3 FISH OIL) 1200 MG CAPS Take by mouth daily.    Marland Kitchen omeprazole (PRILOSEC) 20 MG capsule TAKE 1 CAPSULE (20 MG TOTAL) BY MOUTH  DAILY. 30 capsule 3  . simvastatin (ZOCOR) 20 MG tablet Take 1 tablet (20 mg total) by mouth at bedtime. 30 tablet 5  . SUMAtriptan (IMITREX) 100 MG tablet TAKE 1 TABLET BY MOUTH AS NEEDED FOR HEADACHE. MAY REPEAT IN 2 HOURS IF HEADACHE PERSISTS OR RECURS 10 tablet 3   No current facility-administered medications on file prior to visit.      Past Medical History:  Diagnosis Date  . Basal cell adenocarcinoma   . Elevated cholesterol   . GERD without esophagitis   . Heartburn   . History of chicken pox   . Insomnia   . Migraine headache   . Osteoarthritis    Lumbar spine; hands; feet  . UTI (lower urinary tract infection)    No Known Allergies  Family History  Problem Relation Age of Onset  . Arthritis-Osteo Mother     Living  . Arthritis-Osteo Father     Living  . Diabetes Mother   . Diabetes Father   . Hyperlipidemia Mother   . Hypertension Mother   . Hypertension Father   . Heart attack Maternal Grandfather   . Heart attack Maternal Grandmother   . Alzheimer's disease Maternal Grandmother   . Pancreatic cancer Paternal Grandfather   . Dementia Paternal Grandmother   . Breast cancer Maternal Aunt   . Healthy Brother     x1  . Healthy Sister     x1  . Healthy Son     x2    Social History   Social History  . Marital status: Married    Spouse name: N/A  . Number of children: N/A  . Years of education: N/A   Social History Main Topics  . Smoking status: Never Smoker  . Smokeless tobacco: Never Used  . Alcohol use 1.2 oz/week    2 Standard drinks or equivalent per week  . Drug use: No  . Sexual activity: Yes    Partners: Male   Other Topics Concern  . None   Social History Warehouse manager.    Vitals:   10/26/16 1151  BP: 110/78  Pulse: 74  Resp: 12  O2 sat 97% at RA.  Body mass index is 29.51 kg/m.   Physical Exam  Nursing note and vitals reviewed. Constitutional: She is oriented to person, place, and time. She appears  well-developed. No distress.  HENT:  Head: Atraumatic.  Mouth/Throat: Oropharynx is clear and moist and mucous membranes are normal.  Eyes: Conjunctivae and EOM are normal. Pupils are equal, round, and reactive to light.  Cardiovascular: Normal rate and regular rhythm.   No murmur heard. Pulses:      Dorsalis pedis pulses are 2+ on the right side, and 2+ on the left side.  Respiratory: Effort normal and breath sounds normal. No respiratory distress.  GI: Soft. She exhibits no mass. There is no hepatomegaly. There is no tenderness.  Musculoskeletal: She exhibits no edema.  Lymphadenopathy:  She has no cervical adenopathy.  Neurological: She is alert and oriented to person, place, and time. She has normal strength. No cranial nerve deficit. Coordination and gait normal.  Skin: Skin is warm. No erythema.  Psychiatric: She has a normal mood and affect.  Well groomed, good eye contact.     ASSESSMENT AND PLAN:    Kathryn Cummings was seen today for establish care.  Diagnoses and all orders for this visit:  Migraine without aura and without status migrainosus, not intractable  Stable. No changes in current management. F/U in 6-12 months.  Mixed hyperlipidemia  No changes in current management, will follow labs done today and will give further recommendations accordingly. Low fat diet also to continue. F/U in 6 months.  -     Lipid panel -     CMP  Pre-diabetes  Primary prevention through healthy diet and regular physical activity. Further recommendations will be given according to labs results.   -     Cancel: Hemoglobin A1c -     Hemoglobin A1c  Generalized anxiety disorder  In general well controlled. No changes in Prozac or Xanax,some side effects discussed. F/U in 6 months.  -     FLUoxetine (PROZAC) 20 MG capsule; Take 1 capsule (20 mg total) by mouth daily.     Peyson Postema G. Martinique, MD  [facility]. [place] office.

## 2016-10-26 NOTE — Patient Instructions (Signed)
A few things to remember from today's visit:   Migraine without aura and without status migrainosus, not intractable  Generalized anxiety disorder - Plan: FLUoxetine (PROZAC) 20 MG capsule  Mixed hyperlipidemia - Plan: Lipid panel, Comprehensive metabolic panel  IFG (impaired fasting glucose) - Plan: Hemoglobin A1c  No changes today.   We have ordered labs or studies at this visit.  It can take up to 1-2 weeks for results and processing. IF results require follow up or explanation, we will call you with instructions. Clinically stable results will be released to your [facility]. If you have not heard from [place] or cannot find your results in [facility] in 2 weeks please contact our office at [phone].  If you are not yet signed up for [facility], please consider signing up  Please be sure medication list is accurate. If a new problem present, please set up appointment sooner than planned today.

## 2016-10-26 NOTE — Progress Notes (Signed)
Pre visit review using our clinic review tool, if applicable. No additional management support is needed unless otherwise documented below in the visit note. 

## 2016-10-27 LAB — HEMOGLOBIN A1C
HEMOGLOBIN A1C: 5.6 % (ref <5.7)
MEAN PLASMA GLUCOSE: 114 mg/dL

## 2016-10-27 LAB — LIPID PANEL
CHOL/HDL RATIO: 2.8 ratio (ref <5.0)
CHOLESTEROL: 183 mg/dL (ref <200)
HDL: 65 mg/dL (ref >50)
LDL Cholesterol: 82 mg/dL (ref <100)
Triglycerides: 181 mg/dL — ABNORMAL HIGH (ref <150)
VLDL: 36 mg/dL — ABNORMAL HIGH (ref <30)

## 2016-10-27 LAB — COMPREHENSIVE METABOLIC PANEL
ALT: 18 U/L (ref 6–29)
AST: 20 U/L (ref 10–35)
Albumin: 4.2 g/dL (ref 3.6–5.1)
Alkaline Phosphatase: 62 U/L (ref 33–130)
BILIRUBIN TOTAL: 0.4 mg/dL (ref 0.2–1.2)
BUN: 16 mg/dL (ref 7–25)
CALCIUM: 9.9 mg/dL (ref 8.6–10.4)
CHLORIDE: 102 mmol/L (ref 98–110)
CO2: 24 mmol/L (ref 20–31)
CREATININE: 0.99 mg/dL (ref 0.50–0.99)
Glucose, Bld: 100 mg/dL — ABNORMAL HIGH (ref 65–99)
Potassium: 4.5 mmol/L (ref 3.5–5.3)
SODIUM: 135 mmol/L (ref 135–146)
TOTAL PROTEIN: 6.9 g/dL (ref 6.1–8.1)

## 2016-11-04 ENCOUNTER — Encounter: Payer: Self-pay | Admitting: Family Medicine

## 2016-11-26 ENCOUNTER — Other Ambulatory Visit: Payer: Self-pay | Admitting: Physician Assistant

## 2016-12-10 ENCOUNTER — Other Ambulatory Visit: Payer: Self-pay | Admitting: Family Medicine

## 2016-12-10 ENCOUNTER — Other Ambulatory Visit: Payer: Self-pay | Admitting: Emergency Medicine

## 2016-12-11 ENCOUNTER — Other Ambulatory Visit: Payer: Self-pay | Admitting: Emergency Medicine

## 2016-12-23 ENCOUNTER — Encounter: Payer: Self-pay | Admitting: Family Medicine

## 2016-12-23 ENCOUNTER — Ambulatory Visit (INDEPENDENT_AMBULATORY_CARE_PROVIDER_SITE_OTHER): Payer: BLUE CROSS/BLUE SHIELD | Admitting: Family Medicine

## 2016-12-23 VITALS — BP 104/70 | HR 62 | Temp 97.9°F | Resp 12 | Ht 63.0 in | Wt 160.4 lb

## 2016-12-23 DIAGNOSIS — J069 Acute upper respiratory infection, unspecified: Secondary | ICD-10-CM

## 2016-12-23 DIAGNOSIS — M545 Low back pain, unspecified: Secondary | ICD-10-CM

## 2016-12-23 MED ORDER — BENZONATATE 100 MG PO CAPS: 200.0000 mg | ORAL_CAPSULE | Freq: Two times a day (BID) | ORAL | 0 refills | Status: DC | PRN

## 2016-12-23 MED ORDER — FLUTICASONE PROPIONATE 50 MCG/ACT NA SUSP: 1.0000 | Freq: Two times a day (BID) | NASAL | 0 refills | Status: DC

## 2016-12-23 NOTE — Progress Notes (Signed)
HPI:   ACUTE VISIT:  Chief Complaint  Patient presents with  . cough/congestion    [name] is a 61 y.o. female, who is here today complaining of 5 days of respiratory symptoms.   Problem started with postnasal drainage and sore throat, 4 days ago she started with productive cough with yellowish sputum, fatigue, rhinorrhea, and nasal congestion.She denies hemoptysis.  She has not noted fever, chill, or myalgias.  Denies chest pain, dyspnea, or wheezing. Today right low back pain, dull, mild, no exacerbating or alleviating factors identified. She does not have pain at this time.  No Hx of recent travel. No sick contact, husband got sick with similar symptoms 2 days ago. No known insect bite. Denies Hx of allergies, she takes daily Zyrtec for "hives."  OTC medications for this problem: Nyquil and Advil cold. Symptoms otherwise stable, some have improved (cough and sore throat). She is concerned because she is still having"congestion" and coughing.   Review of Systems  Constitutional: Positive for fatigue. Negative for activity change, appetite change and fever.  HENT: Positive for congestion, postnasal drip, rhinorrhea and sore throat. Negative for ear pain, mouth sores, sinus pressure, trouble swallowing and voice change.   Eyes: Negative for discharge, redness and itching.  Respiratory: Positive for cough. Negative for shortness of breath and wheezing.   Cardiovascular: Negative.   Gastrointestinal: Negative for abdominal pain, diarrhea, nausea and vomiting.  Genitourinary: Negative for decreased urine volume and dysuria.  Musculoskeletal: Positive for back pain. Negative for myalgias and neck pain.  Skin: Negative for pallor and rash.  Allergic/Immunologic: Negative for environmental allergies.  Neurological: Negative for syncope, weakness and headaches.  Hematological: Negative for adenopathy. Does not bruise/bleed easily.  Psychiatric/Behavioral: Negative  for confusion. The patient is nervous/anxious.       Current Outpatient Prescriptions on File Prior to Visit  Medication Sig Dispense Refill  . ALPRAZolam (XANAX) 0.25 MG tablet Take 1 tablet (0.25 mg total) by mouth at bedtime as needed for anxiety. 30 tablet 2  . Ascorbic Acid (VITAMIN C) 100 MG tablet Take 100 mg by mouth daily.    . B Complex-C (SUPER B COMPLEX/VITAMIN C PO) Take by mouth daily.    . Biotin 1 MG CAPS Take by mouth daily.    . cetirizine (ZYRTEC) 10 MG chewable tablet Chew 10 mg by mouth daily as needed for allergies.    . cholecalciferol (VITAMIN D) 400 UNITS TABS tablet Take 1 tablet (400 Units total) by mouth daily. 90 tablet 1  . Estradiol Acetate (FEMRING) 0.05 MG/24HR RING Place vaginally. Every 3 months.    Marland Kitchen FLUoxetine (PROZAC) 20 MG capsule Take 1 capsule (20 mg total) by mouth daily. 90 capsule 2  . Misc Natural Products (OSTEO BI-FLEX ADV JOINT SHIELD) TABS Take 2 tablets by mouth daily.    . Nutritional Supplements (JUICE PLUS FIBRE) LIQD Take by mouth daily.    . Omega-3 Fatty Acids (OMEGA-3 FISH OIL) 1200 MG CAPS Take by mouth daily.    Marland Kitchen omeprazole (PRILOSEC) 20 MG capsule TAKE 1 CAPSULE BY MOUTH DAILY. 90 capsule 1  . simvastatin (ZOCOR) 20 MG tablet TAKE 1 TABLET BY MOUTH AT BEDTIME. 90 tablet 1  . SUMAtriptan (IMITREX) 100 MG tablet TAKE 1 TABLET BY MOUTH AS NEEDED FOR HEADACHE. MAY REPEAT IN 2 HOURS IF HEADACHE PERSISTS OR RECURS 10 tablet 3   No current facility-administered medications on file prior to visit.      Past Medical History:  Diagnosis  Date  . Basal cell adenocarcinoma   . Elevated cholesterol   . GERD without esophagitis   . Heartburn   . History of chicken pox   . Insomnia   . Migraine headache   . Osteoarthritis    Lumbar spine; hands; feet  . UTI (lower urinary tract infection)    No Known Allergies  Social History   Social History  . Marital status: Married    Spouse name: N/A  . Number of children: N/A  . Years  of education: N/A   Social History Main Topics  . Smoking status: Never Smoker  . Smokeless tobacco: Never Used  . Alcohol use 1.2 oz/week    2 Standard drinks or equivalent per week  . Drug use: No  . Sexual activity: Yes    Partners: Male   Other Topics Concern  . None   Social History Warehouse manager.    Vitals:   12/23/16 1047  BP: 104/70  Pulse: 62  Resp: 12  Temp: 97.9 F (36.6 C)  O2 sat at RA 97% Body mass index is 28.41 kg/m.   Physical Exam  Nursing note and vitals reviewed. Constitutional: She is oriented to person, place, and time. She appears well-developed. No distress.  HENT:  Head: Atraumatic.  Nose: Rhinorrhea present. Right sinus exhibits no maxillary sinus tenderness. Left sinus exhibits no maxillary sinus tenderness and no frontal sinus tenderness.  Mouth/Throat: Uvula is midline and mucous membranes are normal. Posterior oropharyngeal erythema (mild) present. No oropharyngeal exudate or posterior oropharyngeal edema.  Post nasal drainage. Hypertrophic turbinates. Nasal voice.  Eyes: Conjunctivae and EOM are normal.  Neck: No muscular tenderness present.  Cardiovascular: Normal rate and regular rhythm.   No murmur heard. Pulses:      Dorsalis pedis pulses are 2+ on the right side, and 2+ on the left side.  Respiratory: Effort normal and breath sounds normal. No respiratory distress.  Musculoskeletal:       Thoracic back: She exhibits no tenderness and no bony tenderness.  Lymphadenopathy:       Head (right side): No submandibular adenopathy present.       Head (left side): No submandibular adenopathy present.    She has cervical adenopathy.       Right cervical: Posterior cervical adenopathy present.       Left cervical: Posterior cervical adenopathy present.  Neurological: She is alert and oriented to person, place, and time. She has normal strength. Coordination normal.  Skin: Skin is warm. No erythema.  Psychiatric: Her mood  appears anxious.  Well groomed, good eye contact.     ASSESSMENT AND PLAN:   Tenia was seen today for cough/congestion.  Diagnoses and all orders for this visit:  URI, acute  Symptoms suggests a viral etiology, I explained that symptomatic treatment is  recommended in this case. I do not think abx is needed at this time. Instructed to monitor for signs of complications, including new onset of fever among some, clearly instructed about warning signs. I also explained that cough and nasal congestion can last a few days and sometimes weeks. CXR could be arranged, she is not interested in having it at his time but instructed to let me know if cough is worse or if still coughing in 2 weeks. F/U as needed.   -     fluticasone (FLONASE) 50 MCG/ACT nasal spray; Place 1 spray into both nostrils 2 (two) times daily. -     benzonatate (TESSALON) 100  MG capsule; Take 2 capsules (200 mg total) by mouth 2 (two) times daily as needed for cough.  Right-sided low back pain without sciatica, unspecified chronicity  Seems muscle related. Lung auscultation negative. Instructed about warning signs.    -Ms.Wiley Flicker was advised to return or notify a doctor immediately if symptoms worsen or persist or new concerns arise.       Marciano Mundt G. Martinique, MD  [facility]. [place] office.

## 2016-12-23 NOTE — Patient Instructions (Signed)
A few things to remember from today's visit:  ACUTE VISIT:   URI, acute - Plan: fluticasone (FLONASE) 50 MCG/ACT nasal spray, benzonatate (TESSALON) 100 MG capsule  viral infections are self-limited and we treat each symptom depending of severity.  Over the counter medications as decongestants and cold medications usually help, they need to be taken with caution if there is a history of high blood pressure or palpitations. Tylenol and/or Ibuprofen also helps with most symptoms (headache, muscle aching, fever,etc) Plenty of fluids. Honey helps with cough. Steam inhalations helps with runny nose, nasal congestion, and may prevent sinus infections. Cough and nasal congestion could last a few days and sometimes weeks. Please follow in not any better in 1-2 weeks or if symptoms get worse. Please be sure medication list is accurate. If a new problem present, please set up appointment sooner than planned today.

## 2016-12-28 ENCOUNTER — Encounter: Payer: Self-pay | Admitting: Family Medicine

## 2016-12-28 ENCOUNTER — Ambulatory Visit (INDEPENDENT_AMBULATORY_CARE_PROVIDER_SITE_OTHER): Payer: BLUE CROSS/BLUE SHIELD | Admitting: Family Medicine

## 2016-12-28 VITALS — BP 126/80 | HR 72 | Temp 97.5°F | Resp 12 | Ht 63.0 in | Wt 162.2 lb

## 2016-12-28 DIAGNOSIS — R0981 Nasal congestion: Secondary | ICD-10-CM | POA: Diagnosis not present

## 2016-12-28 DIAGNOSIS — J019 Acute sinusitis, unspecified: Secondary | ICD-10-CM | POA: Diagnosis not present

## 2016-12-28 MED ORDER — AMOXICILLIN-POT CLAVULANATE 875-125 MG PO TABS: 1.0000 | ORAL_TABLET | Freq: Two times a day (BID) | ORAL | 0 refills | Status: AC

## 2016-12-28 NOTE — Progress Notes (Signed)
HPI:   ACUTE VISIT:  Chief Complaint  Patient presents with  . productive cough  . head congestion    [name] is a 61 y.o. female, who is here today complaining of persistent cough and sinus pressure, which she attributes to sinus infection.  She was seen on 12/23/16 because 5 days of respiratory symptoms.Thought it was a viral URI ,so symptomatic treatment was recommended.  She denies fever, chills, sore throat, or body aches. She still having productive cough with yellowish sputum, which she is not sure if it is coming from her sinuses. Complaining of frontal pressure headache and upper teeth ache.  "Thick" brownish/yellowish rhinorrhea. She denies epistaxis or hemoptysis.  She denies associated visual changes, nausea, or vomiting. She is currently on Flonase intranasal spray, benzonatate, and OTC Zyrtec 10 mg daily. She denies chest pain, dyspnea, or wheezing.    Review of Systems  Constitutional: Positive for fatigue. Negative for activity change, appetite change and fever.  HENT: Positive for congestion, postnasal drip and sinus pressure. Negative for dental problem, hearing loss, nosebleeds, sore throat, trouble swallowing and voice change.        Ear fullness sensation, bilateral.  Eyes: Negative for discharge, redness and visual disturbance.  Respiratory: Positive for cough. Negative for shortness of breath and wheezing.   Gastrointestinal: Negative for abdominal pain, diarrhea, nausea and vomiting.  Skin: Negative for rash.  Allergic/Immunologic: Positive for environmental allergies.  Neurological: Positive for headaches. Negative for syncope and weakness.  Hematological: Negative for adenopathy. Does not bruise/bleed easily.  Psychiatric/Behavioral: Negative for confusion. The patient is nervous/anxious.      Current Outpatient Prescriptions on File Prior to Visit  Medication Sig Dispense Refill  . ALPRAZolam (XANAX) 0.25 MG tablet Take 1 tablet  (0.25 mg total) by mouth at bedtime as needed for anxiety. 30 tablet 2  . Ascorbic Acid (VITAMIN C) 100 MG tablet Take 100 mg by mouth daily.    . B Complex-C (SUPER B COMPLEX/VITAMIN C PO) Take by mouth daily.    . benzonatate (TESSALON) 100 MG capsule Take 2 capsules (200 mg total) by mouth 2 (two) times daily as needed for cough. 45 capsule 0  . Biotin 1 MG CAPS Take by mouth daily.    . celecoxib (CELEBREX) 100 MG capsule Take 100 mg by mouth as needed.    . cetirizine (ZYRTEC) 10 MG chewable tablet Chew 10 mg by mouth daily as needed for allergies.    . cholecalciferol (VITAMIN D) 400 UNITS TABS tablet Take 1 tablet (400 Units total) by mouth daily. 90 tablet 1  . Estradiol Acetate (FEMRING) 0.05 MG/24HR RING Place vaginally. Every 3 months.    Marland Kitchen FLUoxetine (PROZAC) 20 MG capsule Take 1 capsule (20 mg total) by mouth daily. 90 capsule 2  . fluticasone (FLONASE) 50 MCG/ACT nasal spray Place 1 spray into both nostrils 2 (two) times daily. 16 g 0  . Misc Natural Products (OSTEO BI-FLEX ADV JOINT SHIELD) TABS Take 2 tablets by mouth daily.    . Nutritional Supplements (JUICE PLUS FIBRE) LIQD Take by mouth daily.    . Omega-3 Fatty Acids (OMEGA-3 FISH OIL) 1200 MG CAPS Take by mouth daily.    Marland Kitchen omeprazole (PRILOSEC) 20 MG capsule TAKE 1 CAPSULE BY MOUTH DAILY. 90 capsule 1  . simvastatin (ZOCOR) 20 MG tablet TAKE 1 TABLET BY MOUTH AT BEDTIME. 90 tablet 1  . SUMAtriptan (IMITREX) 100 MG tablet TAKE 1 TABLET BY MOUTH AS NEEDED FOR HEADACHE. MAY  REPEAT IN 2 HOURS IF HEADACHE PERSISTS OR RECURS 10 tablet 3   No current facility-administered medications on file prior to visit.      Past Medical History:  Diagnosis Date  . Basal cell adenocarcinoma   . Elevated cholesterol   . GERD without esophagitis   . Heartburn   . History of chicken pox   . Insomnia   . Migraine headache   . Osteoarthritis    Lumbar spine; hands; feet  . UTI (lower urinary tract infection)    No Known  Allergies  Social History   Social History  . Marital status: Married    Spouse name: N/A  . Number of children: N/A  . Years of education: N/A   Social History Main Topics  . Smoking status: Never Smoker  . Smokeless tobacco: Never Used  . Alcohol use 1.2 oz/week    2 Standard drinks or equivalent per week  . Drug use: No  . Sexual activity: Yes    Partners: Male   Other Topics Concern  . None   Social History Warehouse manager.    Vitals:   12/28/16 1150  BP: 126/80  Pulse: 72  Resp: 12  Temp: 97.5 F (36.4 C)  O2 sat at RA 97% Body mass index is 28.74 kg/m.   Physical Exam  Nursing note and vitals reviewed. Constitutional: She is oriented to person, place, and time. She appears well-developed. She does not appear ill. No distress.  HENT:  Head: Atraumatic.  Right Ear: Tympanic membrane, external ear and ear canal normal.  Left Ear: External ear and ear canal normal. Tympanic membrane is scarred (mild).  Nose: Rhinorrhea present.  Mouth/Throat: Oropharynx is clear and moist and mucous membranes are normal.  Nasal voice. Mild pressure sensation upon palpation of sinuses (frontal and maxillary bilateral). No edema or erythema. Transillumination of sinuses normal.  Eyes: Conjunctivae and EOM are normal.  Cardiovascular: Normal rate and regular rhythm.   No murmur heard. Respiratory: Effort normal and breath sounds normal. No respiratory distress.  Lymphadenopathy:    She has no cervical adenopathy.  Neurological: She is alert and oriented to person, place, and time. She has normal strength. No cranial nerve deficit. Gait normal.  Skin: Skin is warm. No erythema.  Psychiatric: Her speech is normal. Her mood appears anxious.  Well groomed, good eye contact.      ASSESSMENT AND PLAN:   [name] was seen today for productive cough and head congestion.  Diagnoses and all orders for this visit:  Nasal sinus congestion  Acute non-recurrent  sinusitis, unspecified location -     amoxicillin-clavulanate (AUGMENTIN) 875-125 MG tablet; Take 1 tablet by mouth 2 (two) times daily.   I still feel like her symptoms are related to residual symptoms after a viral URI. Explained that nasal congestion and cough can last a few weeks after URI infection. We discussed some side effects of antibiotic therapy. Continue Zyrtec, Flonase nasal spray, and nasal irrigations as needed. Instructed about warning signs. If she continues having sinus pressure/pain we might want to consider sinus CT.  F/U as needed.   Return if symptoms worsen or fail to improve.     Tyra Gural G. Martin[place], MD  [facility]. [place] office.

## 2016-12-28 NOTE — Patient Instructions (Addendum)
A few things to remember from today's visit:   Sinus pain  Acute non-recurrent sinusitis, unspecified location - Plan: amoxicillin-clavulanate (AUGMENTIN) 875-125 MG tablet   Over the counter medications as decongestants and cold medications usually help, they need to be taken with caution if there is a history of high blood pressure or palpitations.  Honey helps with cough. Steam inhalations helps with runny nose, nasal congestion, and may prevent sinus infections. Cough and nasal congestion could last a few days and sometimes weeks.    Sinus congestion or pain is not always bacterial. If symptoms persist we may need sinus CT.   Please be sure medication list is accurate. If a new problem present, please set up appointment sooner than planned today.

## 2017-01-13 ENCOUNTER — Other Ambulatory Visit: Payer: Self-pay | Admitting: Physician Assistant

## 2017-01-13 DIAGNOSIS — G479 Sleep disorder, unspecified: Secondary | ICD-10-CM

## 2017-01-14 NOTE — Telephone Encounter (Signed)
So Alprazolam 0.25 mg tab can be called in to continue 1/2-1 tab daily as needed. #30/1.  Thanks, BJ

## 2017-01-14 NOTE — Telephone Encounter (Signed)
Rx phoned in.   

## 2017-01-14 NOTE — Telephone Encounter (Signed)
That Rx is expired

## 2017-01-14 NOTE — Telephone Encounter (Signed)
It seems like she still has 2 refills left from prior Rx.  [[place] controlled subs Alprazolam filled 09/17/16 # 30 and 2 refills available].  Thanks, BJ

## 2017-01-25 DIAGNOSIS — H40013 Open angle with borderline findings, low risk, bilateral: Secondary | ICD-10-CM | POA: Diagnosis not present

## 2017-01-25 DIAGNOSIS — H5213 Myopia, bilateral: Secondary | ICD-10-CM | POA: Diagnosis not present

## 2017-03-24 ENCOUNTER — Other Ambulatory Visit: Payer: Self-pay | Admitting: Family Medicine

## 2017-03-24 DIAGNOSIS — F411 Generalized anxiety disorder: Secondary | ICD-10-CM

## 2017-05-06 ENCOUNTER — Encounter: Payer: Self-pay | Admitting: Family Medicine

## 2017-05-22 ENCOUNTER — Other Ambulatory Visit: Payer: Self-pay | Admitting: Family Medicine

## 2017-05-31 DIAGNOSIS — Z79890 Hormone replacement therapy (postmenopausal): Secondary | ICD-10-CM | POA: Diagnosis not present

## 2017-05-31 DIAGNOSIS — Z1231 Encounter for screening mammogram for malignant neoplasm of breast: Secondary | ICD-10-CM | POA: Diagnosis not present

## 2017-05-31 DIAGNOSIS — Z01419 Encounter for gynecological examination (general) (routine) without abnormal findings: Secondary | ICD-10-CM | POA: Diagnosis not present

## 2017-05-31 DIAGNOSIS — Z1389 Encounter for screening for other disorder: Secondary | ICD-10-CM | POA: Diagnosis not present

## 2017-05-31 DIAGNOSIS — G4700 Insomnia, unspecified: Secondary | ICD-10-CM | POA: Diagnosis not present

## 2017-05-31 DIAGNOSIS — Z6829 Body mass index (BMI) 29.0-29.9, adult: Secondary | ICD-10-CM | POA: Diagnosis not present

## 2017-05-31 DIAGNOSIS — Z130 Encounter for screening for diseases of the blood and blood-forming organs and certain disorders involving the immune mechanism: Secondary | ICD-10-CM | POA: Diagnosis not present

## 2017-07-02 IMAGING — MG MM DIGITAL DIAGNOSTIC UNILAT*R* W/ TOMO W/ CAD
6 series · 6 of 14 positions shown · non-contrast
Comparison: Previous exam(s).

CLINICAL DATA: Screening recall for a possible right breast mass.

EXAM:
2D DIGITAL DIAGNOSTIC UNILATERAL RIGHT MAMMOGRAM WITH CAD AND
ADJUNCT TOMO
RIGHT BREAST ULTRASOUND

[R MLO]
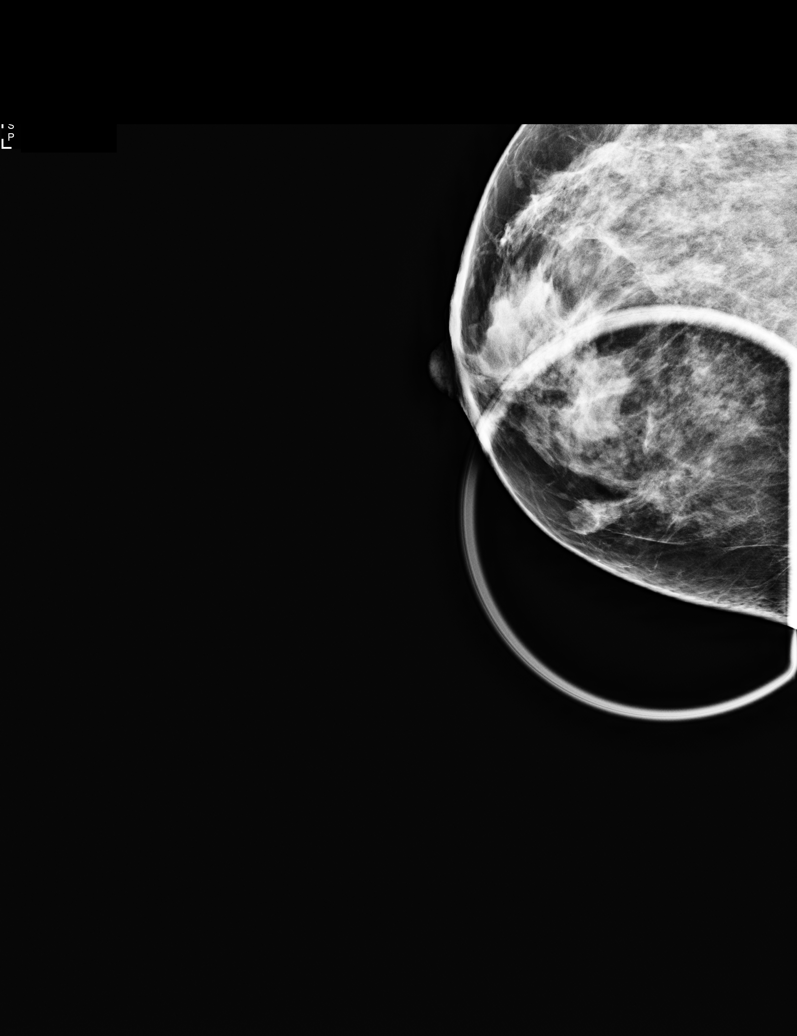

[R CC synth-2D]
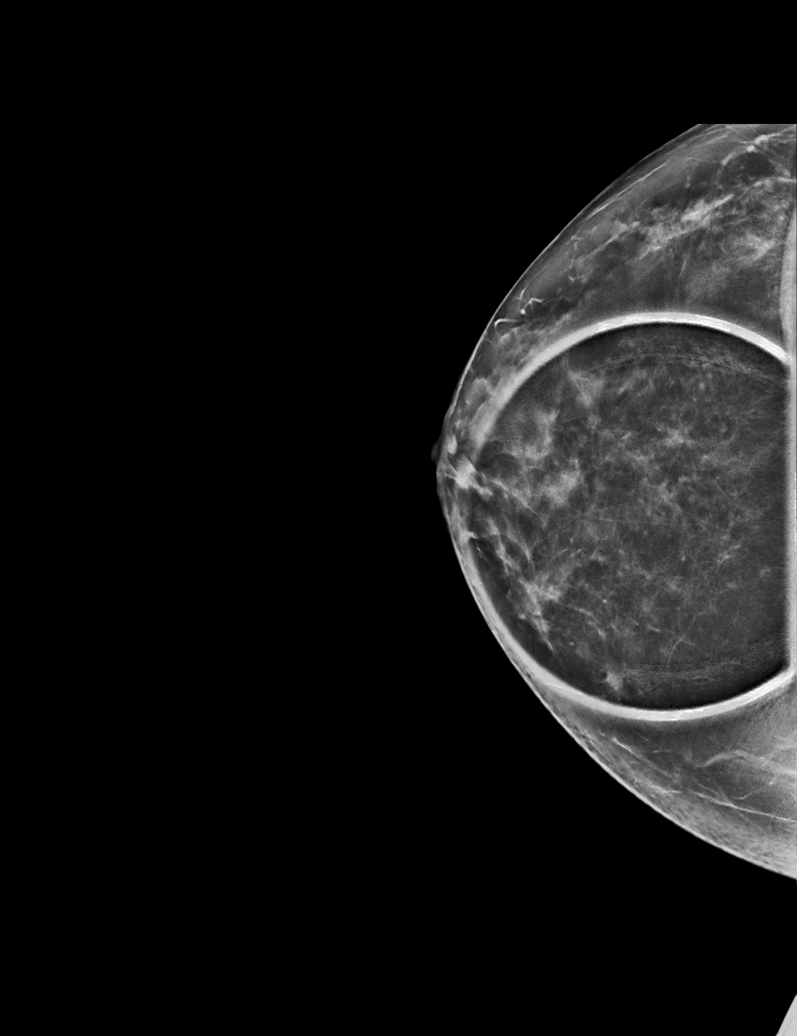

[R MLO synth-2D]
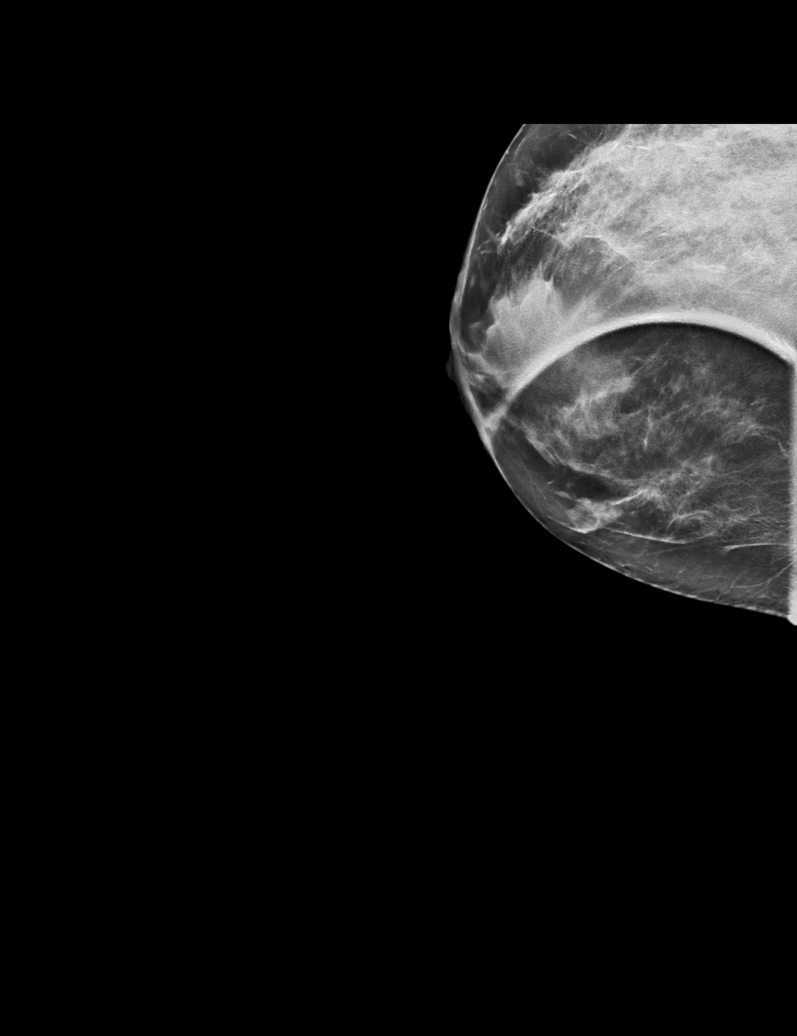

[R CC]
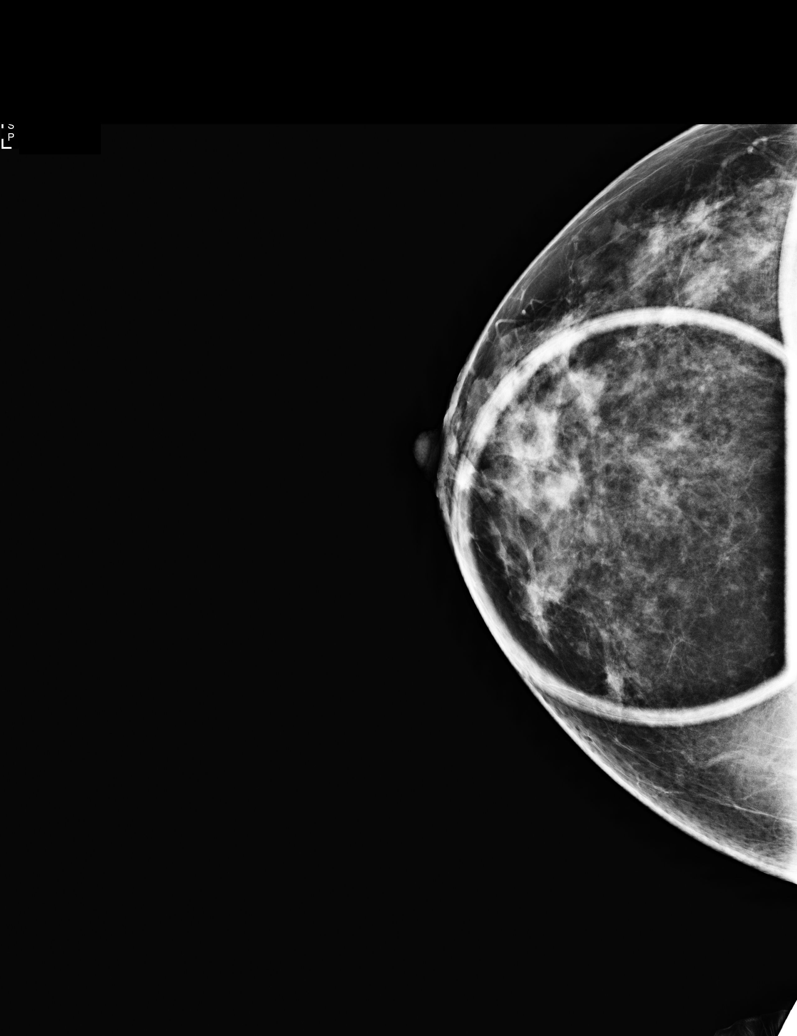

[R MLO tomo · tomo slice 32/63.0]
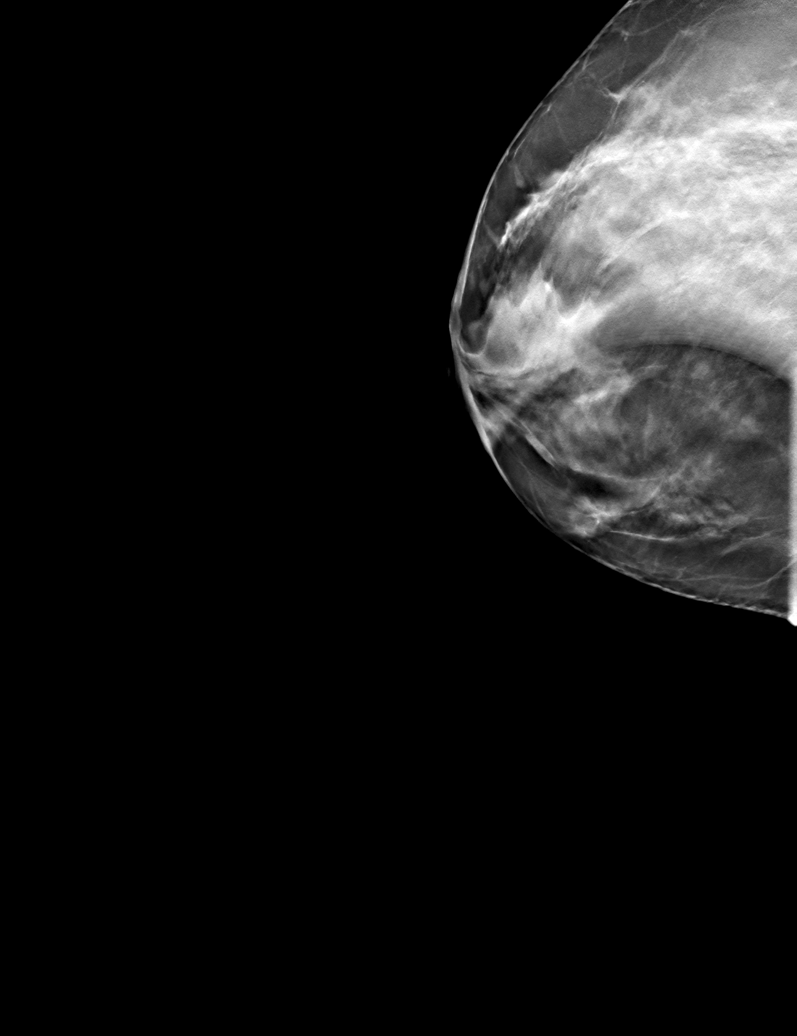

[R CC tomo · tomo slice 35/68.0]
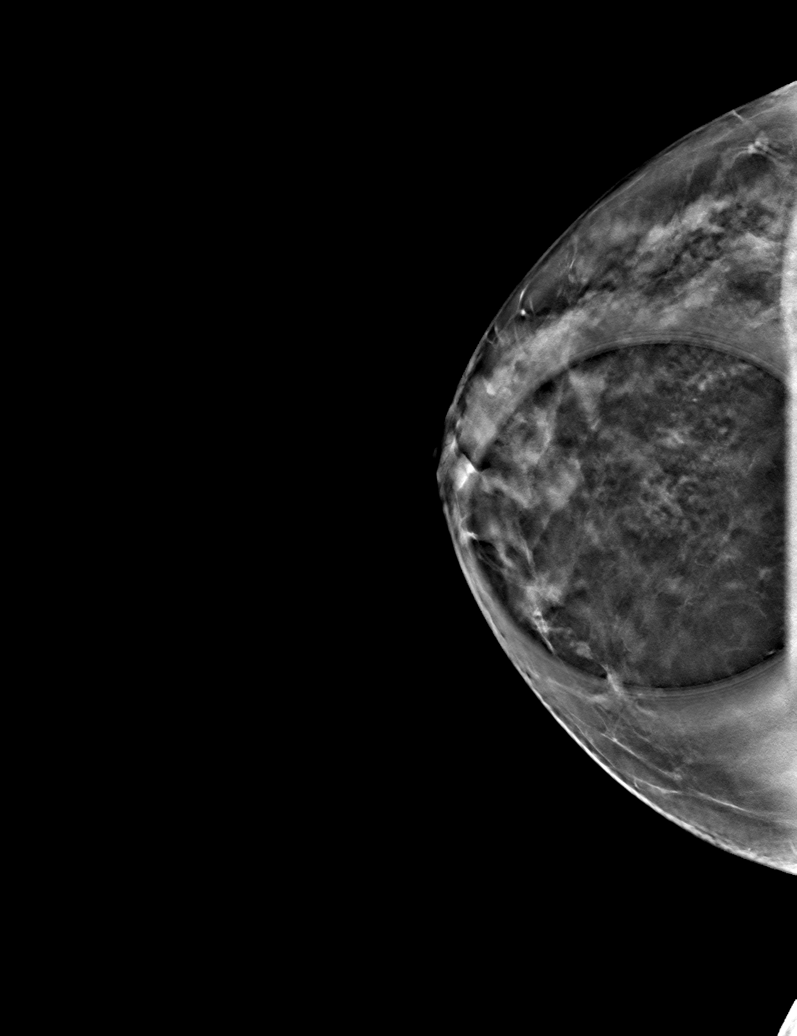

[6 of 14 positions shown; findings below may reference images not displayed]

ACR Breast Density Category c: The breast tissue is heterogeneously
dense, which may obscure small masses.
FINDINGS: There is a persistent mass identified in the inferior retroareolar
right breast.

Mammographic images were processed with CAD.

There is smooth mobile palpable mass inferior to the nipples the
right breast.

Ultrasound of the right breast at 6 o'clock, 1 cm from the nipple
demonstrates a circumscribed oval slightly hypoechoic vascular mass
which measures 1.1 x 0.6 x 0.9 cm. Ultrasound of the right axilla
demonstrates multiple normal-appearing lymph nodes.
IMPRESSION: 1. There is an indeterminate 1.1 cm mass in the right breast at 6
o'clock.

2.  No evidence of right axillary lymphadenopathy.

RECOMMENDATION:
Ultrasound-guided biopsy is recommended for the right breast mass.
The patient was unable to schedule today, but states she will call
us back to schedule the procedure.

I have discussed the findings and recommendations with the patient.
Results were also provided in writing at the conclusion of the
visit. If applicable, a reminder letter will be sent to the patient
regarding the next appointment.

BI-RADS CATEGORY  4: Suspicious.

## 2017-07-26 ENCOUNTER — Other Ambulatory Visit: Payer: Self-pay | Admitting: Family Medicine

## 2017-08-06 ENCOUNTER — Other Ambulatory Visit: Payer: Self-pay | Admitting: *Deleted

## 2017-08-06 MED ORDER — SIMVASTATIN 20 MG PO TABS: 20.0000 mg | ORAL_TABLET | Freq: Every day | ORAL | 0 refills | Status: DC

## 2017-09-15 DIAGNOSIS — Z85828 Personal history of other malignant neoplasm of skin: Secondary | ICD-10-CM | POA: Diagnosis not present

## 2017-09-15 DIAGNOSIS — L820 Inflamed seborrheic keratosis: Secondary | ICD-10-CM | POA: Diagnosis not present

## 2017-09-15 DIAGNOSIS — L570 Actinic keratosis: Secondary | ICD-10-CM | POA: Diagnosis not present

## 2017-09-15 DIAGNOSIS — L821 Other seborrheic keratosis: Secondary | ICD-10-CM | POA: Diagnosis not present

## 2017-10-22 ENCOUNTER — Other Ambulatory Visit: Payer: Self-pay | Admitting: *Deleted

## 2017-11-03 ENCOUNTER — Other Ambulatory Visit: Payer: Self-pay | Admitting: *Deleted

## 2017-11-03 MED ORDER — SIMVASTATIN 20 MG PO TABS: 20.0000 mg | ORAL_TABLET | Freq: Every day | ORAL | 2 refills | Status: DC

## 2017-12-03 ENCOUNTER — Other Ambulatory Visit: Payer: Self-pay | Admitting: Family Medicine

## 2017-12-24 ENCOUNTER — Other Ambulatory Visit: Payer: Self-pay | Admitting: Family Medicine

## 2017-12-24 DIAGNOSIS — F411 Generalized anxiety disorder: Secondary | ICD-10-CM

## 2017-12-28 ENCOUNTER — Other Ambulatory Visit: Payer: Self-pay | Admitting: Family Medicine

## 2017-12-28 DIAGNOSIS — F411 Generalized anxiety disorder: Secondary | ICD-10-CM

## 2018-01-31 NOTE — Progress Notes (Signed)
HPI:   [name] is a 62 y.o. female, who is here today for her routine physical.  Last CPE: 12/2015  Regular exercise 3 or more time per week: Not much at all. Following a healthy diet: Yes. She lives with her husband.  Her husband has been sick after marrow transplant a few years ago.This year he is doing better.  Chronic medical problems: Migraine,anxiety,HLD,GERD.   Pap smear: S/P hysterectomy, fibroid tumors.  She also had oophorectomy due to hemorrhagic ovarian cyst.  She follows with gynecology regularly, next appointment in 03/2018.   Immunization History  Administered Date(s) Administered  . Influenza,inj,Quad PF,6+ Mos 09/17/2014, 06/26/2015, 06/17/2016    Mammogram: Per patient report, 04/2017 at her gynecologist office. Colonoscopy: 01/2015 DEXA: N/A  Hep C screening has not been done before.  Follow up and concerns today:  Last follow-up in 10/2017.  Hyperlipidemia:  Currently on simvastatin 20 mg daily. Following a low fat diet: Yes.  She has not noted side effects with medication.  Lab Results  Component Value Date   CHOL 183 10/26/2016   HDL 65 10/26/2016   LDLCALC 82 10/26/2016   LDLDIRECT 141.0 06/17/2016   TRIG 181 (H) 10/26/2016   CHOLHDL 2.8 10/26/2016   She has had a mildly elevated transaminases in the past. Lab Results  Component Value Date   ALT 18 10/26/2016   AST 20 10/26/2016   ALKPHOS 62 10/26/2016   BILITOT 0.4 10/26/2016     GERD: Currently she is on omeprazole 20 mg daily. She tried to stop medication but developed acid reflux that lasted a few days even after resuming medication.  Denies abdominal pain, nausea, vomiting, changes in bowel habits, blood in stool or melena.  Anxiety disorder: Currently she is on Prozac 20 mg daily, she still feels like medication is helping. Alprazolam 0.25 mg at bedtime as needed, her gynecologist gave her after she ran out of medication a few months ago. Alprazolam helps her  relax at night so she can sleep.  She does not take a daily, in the past she has reported taking medication 1 or 2 times per week, so usually one prescription will refill last over 6 months.  She has been on Prozac for about 2 years.  She has tried higher doses but she has not tolerated it well. Other medications she tried in the past did not help.  Negative for depressed mood or suicidal thoughts. Currently she is not doing psychotherapy, she did a few years ago when her husband was really sick.  Today she is reporting episodes of syncope. Memorial Day while she was on the beach, she started feeling progressively tired, she lost consciousness while she was riding in the golf cart.  She is not sure about duration but it was less than 5 minutes. She had been on the beach for several hours and it was very hot, she thought episode was caused by dehydration.  Negative for headache, chest pain, palpitations, dyspnea, or diaphoresis.  No urine or bowel incontinence. No seizure-like activity. No postictal symptoms.  She remembers having another episode of syncope years ago when she was diagnosed with anemia due to hemorrhagic  ovarian cyst rupture.    Review of Systems  Constitutional: Negative for appetite change, diaphoresis, fatigue and fever.  HENT: Negative for dental problem, hearing loss, mouth sores, trouble swallowing and voice change.   Eyes: Negative for redness and visual disturbance.  Respiratory: Negative for cough, shortness of breath and wheezing.  Cardiovascular: Negative for chest pain and leg swelling.  Gastrointestinal: Negative for abdominal pain, nausea and vomiting.       No changes in bowel habits.  Endocrine: Negative for cold intolerance, heat intolerance, polydipsia, polyphagia and polyuria.  Genitourinary: Negative for decreased urine volume, dysuria, hematuria, vaginal bleeding and vaginal discharge.  Musculoskeletal: Negative for gait problem and myalgias.    Skin: Negative for color change and rash.  Allergic/Immunologic: Positive for environmental allergies.  Neurological: Positive for syncope. Negative for seizures, weakness, numbness and headaches.  Hematological: Negative for adenopathy. Does not bruise/bleed easily.  Psychiatric/Behavioral: Positive for sleep disturbance. Negative for confusion. The patient is nervous/anxious.   All other systems reviewed and are negative.     Current Outpatient Medications on File Prior to Visit  Medication Sig Dispense Refill  . Ascorbic Acid (VITAMIN C) 100 MG tablet Take 100 mg by mouth daily.    . B Complex-C (SUPER B COMPLEX/VITAMIN C PO) Take by mouth daily.    . Biotin 1 MG CAPS Take by mouth daily.    . celecoxib (CELEBREX) 100 MG capsule Take 100 mg by mouth as needed.    . cetirizine (ZYRTEC) 10 MG chewable tablet Chew 10 mg by mouth daily as needed for allergies.    . cholecalciferol (VITAMIN D) 400 UNITS TABS tablet Take 1 tablet (400 Units total) by mouth daily. 90 tablet 1  . estradiol (VIVELLE-DOT) 0.05 MG/24HR patch APPLY 1 PATCH TWICE A WEEK  2  . Influenza vac split quadrivalent PF (FLUZONE QUADRIVALENT) 0.5 ML injection Fluzone Quad 2018-19(PF) 60 mcg(15 mcgx4)/0.5 mL intramuscular syringe  TO BE ADMINISTERED BY PHARMACIST FOR IMMUNIZATION    . Misc Natural Products (OSTEO BI-FLEX ADV JOINT SHIELD) TABS Take 2 tablets by mouth daily.    . Nutritional Supplements (JUICE PLUS FIBRE) LIQD Take by mouth daily.    . Omega-3 Fatty Acids (OMEGA-3 FISH OIL) 1200 MG CAPS Take by mouth daily.    Marland Kitchen omeprazole (PRILOSEC) 20 MG capsule TAKE 1 CAPSULE BY MOUTH DAILY. 90 capsule 1  . simvastatin (ZOCOR) 20 MG tablet Take 1 tablet (20 mg total) by mouth at bedtime. 90 tablet 2  . SUMAtriptan (IMITREX) 100 MG tablet TAKE 1 TABLET BY MOUTH AS NEEDED FOR HEADACHE. MAY REPEAT IN 2 HOURS IF HEADACHE PERSISTS OR RECURS 10 tablet 3  . fluticasone (FLONASE) 50 MCG/ACT nasal spray Place 1 spray into both  nostrils 2 (two) times daily. 16 g 0   No current facility-administered medications on file prior to visit.      Past Medical History:  Diagnosis Date  . Basal cell adenocarcinoma   . Elevated cholesterol   . GERD without esophagitis   . Heartburn   . History of chicken pox   . Insomnia   . Migraine headache   . Osteoarthritis    Lumbar spine; hands; feet  . UTI (lower urinary tract infection)     Past Surgical History:  Procedure Laterality Date  . ABDOMINAL HYSTERECTOMY  1988  . APPENDECTOMY  1988  . BASAL CELL CARCINOMA EXCISION     Multiple  . BLADDER REPAIR  2000   Mesh  . BREAST BIOPSY  1990  . CHOLECYSTECTOMY      Allergies  Allergen Reactions  . Other     Family History  Problem Relation Age of Onset  . Arthritis-Osteo Mother        Living  . Arthritis-Osteo Father        Living  . Diabetes  Mother   . Diabetes Father   . Hyperlipidemia Mother   . Hypertension Mother   . Hypertension Father   . Heart attack Maternal Grandfather   . Heart attack Maternal Grandmother   . Alzheimer's disease Maternal Grandmother   . Pancreatic cancer Paternal Grandfather   . Dementia Paternal Grandmother   . Breast cancer Maternal Aunt   . Healthy Brother        x1  . Healthy Sister        x1  . Healthy Son        x2    Social History   Socioeconomic History  . Marital status: Married    Spouse name: Not on file  . Number of children: Not on file  . Years of education: Not on file  . Highest education level: Not on file  Occupational History  . Not on file  Social Needs  . Financial resource strain: Not on file  . Food insecurity:    Worry: Not on file    Inability: Not on file  . Transportation needs:    Medical: Not on file    Non-medical: Not on file  Tobacco Use  . Smoking status: Never Smoker  . Smokeless tobacco: Never Used  Substance and Sexual Activity  . Alcohol use: Yes    Alcohol/week: 1.2 oz    Types: 2 Standard drinks or equivalent  per week  . Drug use: No  . Sexual activity: Yes    Partners: Male  Lifestyle  . Physical activity:    Days per week: Not on file    Minutes per session: Not on file  . Stress: Not on file  Relationships  . Social connections:    Talks on phone: Not on file    Gets together: Not on file    Attends religious service: Not on file    Active member of club or organization: Not on file    Attends meetings of clubs or organizations: Not on file    Relationship status: Not on file  Other Topics Concern  . Not on file  Social History Narrative   [place].     Vitals:   02/01/18 1013  BP: 124/82  Pulse: 80  Resp: 12  Temp: 98.3 F (36.8 C)  SpO2: 98%   Body mass index is 29.8 kg/m.   Wt Readings from Last 3 Encounters:  02/01/18 168 lb 4 oz (76.3 kg)  12/28/16 162 lb 4 oz (73.6 kg)  12/23/16 160 lb 6 oz (72.7 kg)   Physical Exam  Nursing note and vitals reviewed. Constitutional: She is oriented to person, place, and time. She appears well-developed. No distress.  HENT:  Head: Normocephalic and atraumatic.  Right Ear: Hearing, tympanic membrane, external ear and ear canal normal.  Left Ear: Hearing, tympanic membrane, external ear and ear canal normal.  Mouth/Throat: Uvula is midline, oropharynx is clear and moist and mucous membranes are normal.  Eyes: Pupils are equal, round, and reactive to light. Conjunctivae and EOM are normal.  Neck: No tracheal deviation present. No thyromegaly present.  Cardiovascular: Normal rate and regular rhythm.  No murmur heard. Pulses:      Dorsalis pedis pulses are 2+ on the right side, and 2+ on the left side.  Respiratory: Effort normal and breath sounds normal. No respiratory distress.  GI: Soft. She exhibits no mass. There is no hepatomegaly. There is no tenderness.  Musculoskeletal: She exhibits no edema or tenderness.  No major deformity  or signs of synovitis appreciated.  Lymphadenopathy:    She has no cervical  adenopathy.       Right: No supraclavicular adenopathy present.       Left: No supraclavicular adenopathy present.  Neurological: She is alert and oriented to person, place, and time. She has normal strength. No cranial nerve deficit. Coordination and gait normal.  Reflex Scores:      Bicep reflexes are 2+ on the right side and 2+ on the left side.      Patellar reflexes are 2+ on the right side and 2+ on the left side. Skin: Skin is warm. No rash noted. No erythema.  Psychiatric: Her mood appears anxious. Cognition and memory are normal.  Well groomed, good eye contact.      ASSESSMENT AND PLAN:  Ms. Kathryn Cummings was here today annual physical examination.   Orders Placed This Encounter  Procedures  . Tdap vaccine greater than or equal to 7yo IM  . Hemoglobin A1c  . Hepatitis C antibody screen  . Lipid panel  . TSH  . Comprehensive metabolic panel  . CBC with Differential  . EKG 12-Lead   Lab Results  Component Value Date   WBC 5.9 02/01/2018   HGB 14.0 02/01/2018   HCT 41.1 02/01/2018   MCV 87.4 02/01/2018   PLT 321.0 02/01/2018   Lab Results  Component Value Date   TSH 1.30 02/01/2018   Lab Results  Component Value Date   ALT 38 (H) 02/01/2018   AST 25 02/01/2018   ALKPHOS 90 02/01/2018   BILITOT 0.5 02/01/2018   Lab Results  Component Value Date   CREATININE 0.94 02/01/2018   BUN 16 02/01/2018   NA 137 02/01/2018   K 4.3 02/01/2018   CL 100 02/01/2018   CO2 28 02/01/2018     Routine general medical examination at a health care facility  We discussed the importance of regular physical activity and healthy diet for prevention of chronic illness and/or complications. Preventive guidelines reviewed. Vaccination updated. She will continue following with her gynecologist for her female preventive care.  Ca++ and vit D supplementation to continue. Next CPE in a year.  Diabetes mellitus screening -     Hemoglobin A1c -     Comprehensive metabolic  panel  Syncope and collapse  We discussed possible causes. It could certainly be related to dehydration. She was instructed about warning signs. EKG today normal. Further recommendations will be given according to lab results.  -     EKG 12-Lead -     TSH -     CBC with Differential  Encounter for HCV screening test for high risk patient -     Hepatitis C antibody screen  Need for Tdap vaccination -     Tdap vaccine greater than or equal to 7yo IM  Other orders -     Zoster Vaccine Adjuvanted [facility]) injection; 0.5 ml in muscle and repeat in 8 weeks   Gastroesophageal reflux disease without esophagitis Problem is well controlled on omeprazole 20 mg daily. Continue GERD precautions. Follow-up annually, before if needed.  Hyperlipidemia No changes in simvastatin 20 mg daily. Continue low-fat diet. Further recommendation will be given according to lipid results. Follow-up in 6 to 12 months.  Generalized anxiety disorder He seems to be stable overall. No changes in Prozac 20 mg or alprazolam 0.5 mg daily as needed. Relaxation exercises, yoga, or meditation may help. Recommend considering psychotherapy. As far as symptoms are stable and she  does not need another prescription for alprazolam she can continue following annually, before if needed.    Return in 1 year (on 02/02/2019) for CPE and f/u.     Rip Hawes G. Martin[place], MD  [facility]. [place] office.

## 2018-02-01 ENCOUNTER — Encounter: Payer: Self-pay | Admitting: Family Medicine

## 2018-02-01 ENCOUNTER — Ambulatory Visit (INDEPENDENT_AMBULATORY_CARE_PROVIDER_SITE_OTHER): Payer: BLUE CROSS/BLUE SHIELD | Admitting: Family Medicine

## 2018-02-01 VITALS — BP 124/82 | HR 80 | Temp 98.3°F | Resp 12 | Ht 63.0 in | Wt 168.2 lb

## 2018-02-01 DIAGNOSIS — K219 Gastro-esophageal reflux disease without esophagitis: Secondary | ICD-10-CM | POA: Diagnosis not present

## 2018-02-01 DIAGNOSIS — Z9189 Other specified personal risk factors, not elsewhere classified: Secondary | ICD-10-CM | POA: Diagnosis not present

## 2018-02-01 DIAGNOSIS — F411 Generalized anxiety disorder: Secondary | ICD-10-CM

## 2018-02-01 DIAGNOSIS — R55 Syncope and collapse: Secondary | ICD-10-CM | POA: Diagnosis not present

## 2018-02-01 DIAGNOSIS — Z1159 Encounter for screening for other viral diseases: Secondary | ICD-10-CM

## 2018-02-01 DIAGNOSIS — Z0000 Encounter for general adult medical examination without abnormal findings: Secondary | ICD-10-CM | POA: Diagnosis not present

## 2018-02-01 DIAGNOSIS — E782 Mixed hyperlipidemia: Secondary | ICD-10-CM

## 2018-02-01 DIAGNOSIS — Z23 Encounter for immunization: Secondary | ICD-10-CM

## 2018-02-01 DIAGNOSIS — Z131 Encounter for screening for diabetes mellitus: Secondary | ICD-10-CM

## 2018-02-01 LAB — CBC WITH DIFFERENTIAL/PLATELET
Basophils Absolute: 0.1 10*3/uL (ref 0.0–0.1)
Basophils Relative: 0.9 % (ref 0.0–3.0)
EOS ABS: 0.2 10*3/uL (ref 0.0–0.7)
EOS PCT: 3 % (ref 0.0–5.0)
HCT: 41.1 % (ref 36.0–46.0)
HEMOGLOBIN: 14 g/dL (ref 12.0–15.0)
LYMPHS ABS: 2.3 10*3/uL (ref 0.7–4.0)
Lymphocytes Relative: 38.6 % (ref 12.0–46.0)
MCHC: 34.2 g/dL (ref 30.0–36.0)
MCV: 87.4 fl (ref 78.0–100.0)
MONO ABS: 0.3 10*3/uL (ref 0.1–1.0)
Monocytes Relative: 5.5 % (ref 3.0–12.0)
NEUTROS PCT: 52 % (ref 43.0–77.0)
Neutro Abs: 3.1 10*3/uL (ref 1.4–7.7)
Platelets: 321 10*3/uL (ref 150.0–400.0)
RBC: 4.7 Mil/uL (ref 3.87–5.11)
RDW: 12.9 % (ref 11.5–15.5)
WBC: 5.9 10*3/uL (ref 4.0–10.5)

## 2018-02-01 LAB — COMPREHENSIVE METABOLIC PANEL
ALBUMIN: 4.5 g/dL (ref 3.5–5.2)
ALK PHOS: 90 U/L (ref 39–117)
ALT: 38 U/L — ABNORMAL HIGH (ref 0–35)
AST: 25 U/L (ref 0–37)
BUN: 16 mg/dL (ref 6–23)
CO2: 28 mEq/L (ref 19–32)
CREATININE: 0.94 mg/dL (ref 0.40–1.20)
Calcium: 10.3 mg/dL (ref 8.4–10.5)
Chloride: 100 mEq/L (ref 96–112)
GFR: 64.18 mL/min (ref >60.00)
GLUCOSE: 113 mg/dL — AB (ref 70–99)
Potassium: 4.3 mEq/L (ref 3.5–5.1)
SODIUM: 137 meq/L (ref 135–145)
TOTAL PROTEIN: 7.3 g/dL (ref 6.0–8.3)
Total Bilirubin: 0.5 mg/dL (ref 0.2–1.2)

## 2018-02-01 LAB — LIPID PANEL
CHOL/HDL RATIO: 4
CHOLESTEROL: 266 mg/dL — AB (ref 0–200)
HDL: 64.3 mg/dL (ref >39.00)
NonHDL: 201.95
TRIGLYCERIDES: 317 mg/dL — AB (ref 0.0–149.0)
VLDL: 63.4 mg/dL — ABNORMAL HIGH (ref 0.0–40.0)

## 2018-02-01 LAB — HEMOGLOBIN A1C: Hgb A1c MFr Bld: 6 % (ref 4.6–6.5)

## 2018-02-01 LAB — LDL CHOLESTEROL, DIRECT: LDL DIRECT: 160 mg/dL

## 2018-02-01 LAB — TSH: TSH: 1.3 u[IU]/mL (ref 0.35–4.50)

## 2018-02-01 MED ORDER — ALPRAZOLAM 0.25 MG PO TABS: 0.1250 mg | ORAL_TABLET | Freq: Every evening | ORAL | 1 refills | Status: DC | PRN

## 2018-02-01 MED ORDER — ZOSTER VAC RECOMB ADJUVANTED 50 MCG/0.5ML IM SUSR: INTRAMUSCULAR | 1 refills | Status: DC

## 2018-02-01 MED ORDER — FLUOXETINE HCL 20 MG PO CAPS: 20.0000 mg | ORAL_CAPSULE | Freq: Every day | ORAL | 3 refills | Status: DC

## 2018-02-01 NOTE — Patient Instructions (Addendum)
A few things to remember from today's visit:   Routine general medical examination at a health care facility  Mixed hyperlipidemia - Plan: Lipid panel, Comprehensive metabolic panel  Diabetes mellitus screening - Plan: Hemoglobin A1c, Comprehensive metabolic panel  Syncope and collapse - Plan: EKG 12-Lead, TSH, CBC with Differential  Encounter for HCV screening test for high risk patient - Plan: Hepatitis C antibody screen  Generalized anxiety disorder - Plan: FLUoxetine (PROZAC) 20 MG capsule  Sleeping difficulties - Plan: ALPRAZolam (XANAX) 0.25 MG tablet, DISCONTINUED: ALPRAZolam (XANAX) 0.25 MG tablet  Today you have you routine preventive visit.  At least 150 minutes of moderate exercise per week, daily brisk walking for 15-30 min is a good exercise option. Healthy diet low in saturated (animal) fats and sweets and consisting of fresh fruits and vegetables, lean meats such as fish and white chicken and whole grains.  These are some of recommendations for screening depending of age and risk factors:   - Vaccines:  Tdap vaccine every 10 years.  Shingles vaccine recommended at age 86, could be given after 62 years of age but not sure about insurance coverage.   Pneumonia vaccines:  Prevnar 13 at 65 and Pneumovax at 54. Sometimes Pneumovax is giving earlier if history of smoking, lung disease,diabetes,kidney disease among some.    Screening for diabetes at age 89 and every 3 years.  Cervical cancer prevention:  Pap smear starts at 62 years of age and continues periodically until 62 years old in low risk women. Pap smear every 3 years between 9 and 80 years old. Pap smear every 3-5 years between women 56 and older if pap smear negative and HPV screening negative.   -Breast cancer: Mammogram: There is disagreement between experts about when to start screening in low risk asymptomatic female but recent recommendations are to start screening at 70 and not later than 62 years  old , every 1-2 years and after 62 yo q 2 years. Screening is recommended until 62 years old but some women can continue screening depending of healthy issues.   Colon cancer screening: starts at 62 years old until 62 years old.  Cholesterol disorder screening at age 45 and every 3 years.  Also recommended:  1. Dental visit- Brush and floss your teeth twice daily; visit your dentist twice a year. 2. Eye doctor- Get an eye exam at least every 2 years. 3. Helmet use- Always wear a helmet when riding a bicycle, motorcycle, rollerblading or skateboarding. 4. Safe sex- If you may be exposed to sexually transmitted infections, use a condom. 5. Seat belts- Seat belts can save your live; always wear one. 6. Smoke/Carbon Monoxide detectors- These detectors need to be installed on the appropriate level of your home. Replace batteries at least once a year. 7. Skin cancer- When out in the sun please cover up and use sunscreen 15 SPF or higher. 8. Violence- If anyone is threatening or hurting you, please tell your healthcare provider.  9. Drink alcohol in moderation- Limit alcohol intake to one drink or less per day. Never drink and drive.  Please be sure medication list is accurate. If a new problem present, please set up appointment sooner than planned today.   No changes in current medications. If you need another prescription for Xanax please arrange follow-up appointment.  Otherwise I will see you in a year.

## 2018-02-01 NOTE — Assessment & Plan Note (Signed)
No changes in simvastatin 20 mg daily. Continue low-fat diet. Further recommendation will be given according to lipid results. Follow-up in 6 to 12 months.

## 2018-02-01 NOTE — Assessment & Plan Note (Signed)
He seems to be stable overall. No changes in Prozac 20 mg or alprazolam 0.5 mg daily as needed. Relaxation exercises, yoga, or meditation may help. Recommend considering psychotherapy. As far as symptoms are stable and she does not need another prescription for alprazolam she can continue following annually, before if needed.

## 2018-02-01 NOTE — Assessment & Plan Note (Signed)
Problem is well controlled on omeprazole 20 mg daily. Continue GERD precautions. Follow-up annually, before if needed.

## 2018-02-02 LAB — HEPATITIS C ANTIBODY
HEP C AB: NONREACTIVE
SIGNAL TO CUT-OFF: 0.01 (ref <1.00)

## 2018-02-03 ENCOUNTER — Encounter: Payer: Self-pay | Admitting: Family Medicine

## 2018-02-04 ENCOUNTER — Other Ambulatory Visit: Payer: Self-pay | Admitting: *Deleted

## 2018-02-04 MED ORDER — SIMVASTATIN 40 MG PO TABS: 40.0000 mg | ORAL_TABLET | Freq: Every day | ORAL | 3 refills | Status: DC

## 2018-04-28 DIAGNOSIS — H5213 Myopia, bilateral: Secondary | ICD-10-CM | POA: Diagnosis not present

## 2018-04-28 DIAGNOSIS — H1859 Other hereditary corneal dystrophies: Secondary | ICD-10-CM | POA: Diagnosis not present

## 2018-06-06 ENCOUNTER — Other Ambulatory Visit: Payer: Self-pay | Admitting: Family Medicine

## 2018-06-29 LAB — HM MAMMOGRAPHY: HM Mammogram: NORMAL (ref 0–4)

## 2018-07-13 DIAGNOSIS — Z1389 Encounter for screening for other disorder: Secondary | ICD-10-CM | POA: Diagnosis not present

## 2018-07-13 DIAGNOSIS — Z130 Encounter for screening for diseases of the blood and blood-forming organs and certain disorders involving the immune mechanism: Secondary | ICD-10-CM | POA: Diagnosis not present

## 2018-07-13 DIAGNOSIS — Z01419 Encounter for gynecological examination (general) (routine) without abnormal findings: Secondary | ICD-10-CM | POA: Diagnosis not present

## 2018-07-13 DIAGNOSIS — Z6828 Body mass index (BMI) 28.0-28.9, adult: Secondary | ICD-10-CM | POA: Diagnosis not present

## 2018-07-22 DIAGNOSIS — Z1231 Encounter for screening mammogram for malignant neoplasm of breast: Secondary | ICD-10-CM | POA: Diagnosis not present

## 2018-08-13 DIAGNOSIS — H04309 Unspecified dacryocystitis of unspecified lacrimal passage: Secondary | ICD-10-CM | POA: Diagnosis not present

## 2018-09-20 ENCOUNTER — Other Ambulatory Visit: Payer: Self-pay | Admitting: Family Medicine

## 2018-09-20 DIAGNOSIS — J069 Acute upper respiratory infection, unspecified: Secondary | ICD-10-CM

## 2018-10-28 DIAGNOSIS — L570 Actinic keratosis: Secondary | ICD-10-CM | POA: Diagnosis not present

## 2018-10-28 DIAGNOSIS — D225 Melanocytic nevi of trunk: Secondary | ICD-10-CM | POA: Diagnosis not present

## 2018-10-28 DIAGNOSIS — D2261 Melanocytic nevi of right upper limb, including shoulder: Secondary | ICD-10-CM | POA: Diagnosis not present

## 2018-10-28 DIAGNOSIS — L918 Other hypertrophic disorders of the skin: Secondary | ICD-10-CM | POA: Diagnosis not present

## 2018-10-28 DIAGNOSIS — Z85828 Personal history of other malignant neoplasm of skin: Secondary | ICD-10-CM | POA: Diagnosis not present

## 2018-10-28 DIAGNOSIS — D2262 Melanocytic nevi of left upper limb, including shoulder: Secondary | ICD-10-CM | POA: Diagnosis not present

## 2018-12-15 ENCOUNTER — Other Ambulatory Visit: Payer: Self-pay | Admitting: Family Medicine

## 2018-12-21 ENCOUNTER — Other Ambulatory Visit: Payer: Self-pay | Admitting: Family Medicine

## 2018-12-21 DIAGNOSIS — F411 Generalized anxiety disorder: Secondary | ICD-10-CM

## 2019-02-02 ENCOUNTER — Encounter: Payer: Self-pay | Admitting: Family Medicine

## 2019-02-24 ENCOUNTER — Other Ambulatory Visit: Payer: Self-pay | Admitting: *Deleted

## 2019-02-24 NOTE — Telephone Encounter (Signed)
Called patient to schedule earlier appointment for 02/27/2019.

## 2019-02-26 NOTE — Progress Notes (Signed)
HPI:   [name] is a 63 y.o. female, who is here today for her routine physical.  Last CPE: 02/01/18  Regular exercise 3 or more time per week: Not consistently since 10/2018. Following a healthy diet: "Fairly well." She lives with her husband, who is doing better for the past 6-8 months.  Chronic medical problems: Migraine,HLD,GERD,and anxiety among some.  S/P hysterectomy due to fibroid tumors and oophorectomy due to hemorrhagic ovarian cyst. Follows with gyn annually.   Immunization History  Administered Date(s) Administered  . Influenza,inj,Quad PF,6+ Mos 09/17/2014, 06/26/2015, 06/17/2016, 07/20/2017  . Tdap 02/01/2018    Mammogram: 07/2018 at her gynecologist office. Colonoscopy: 01/2015, she is due in 01/2020.  Hep C screening : 02/01/2018 NR.  She has no concerns today.  Chronic disease management  HLD,she is on Simvastatin 40 mg daily. She has not been very consistent with following a low-fat diet. Tolerating medication well.  Lab Results  Component Value Date   CHOL 266 (H) 02/01/2018   HDL 64.30 02/01/2018   LDLCALC 82 10/26/2016   LDLDIRECT 160.0 02/01/2018   TRIG 317.0 (H) 02/01/2018   CHOLHDL 4 02/01/2018   Anxiety: She is on Alprazolam 0.25 mg at bedtime as needed, 1-2 times per week in average. Usually 30 tabs with 1-2 refills of Alprazolam lasts a year.  Also taking Prozac 20 mg daily, which she has taken for about 3-4 years. Today she is complaining about fatigue and body aches since 08/2018. She does not feel rested when she first gets up in the morning. Sometimes she has to take naps during the day, which helps.  Negative for fever, chills, abnormal weight loss, joint edema or erythema. History of OA.  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 She denies depressed mood or suicidal thoughts.  Having difficulty staying asleep. She takes alprazolam 0.25 mg sometimes when she  wakes up, she is able to sleep 3-4 more hours. She has not identified exacerbating alleviating factors. She has not tried OTC medication.  GERD: On Omeprazole 20 mg daily. She does not have heartburn or acid reflux She is taking medication.  Prediabetes: Denies abdominal pain, nausea,vomiting, polydipsia,polyuria, or polyphagia.  Lab Results  Component Value Date   HGBA1C 6.0 02/01/2018    Review of Systems  Constitutional: Positive for fatigue. Negative for appetite change, chills and fever.  HENT: Negative for dental problem, hearing loss, mouth sores, sore throat, trouble swallowing and voice change.   Eyes: Negative for redness and visual disturbance.  Respiratory: Negative for cough, shortness of breath and wheezing.   Cardiovascular: Negative for chest pain and leg swelling.  Gastrointestinal: Negative for abdominal pain, nausea and vomiting.       No changes in bowel habits.  Endocrine: Negative for cold intolerance, heat intolerance, polydipsia, polyphagia and polyuria.  Genitourinary: Negative for decreased urine volume, dysuria, hematuria, vaginal bleeding and vaginal discharge.  Musculoskeletal: Positive for arthralgias, back pain and myalgias. Negative for gait problem.  Skin: Negative for color change and rash.  Allergic/Immunologic: Positive for environmental allergies.  Neurological: Negative for syncope, weakness and headaches.  Hematological: Negative for adenopathy. Does not bruise/bleed easily.  Psychiatric/Behavioral: Positive for sleep disturbance. Negative for confusion and hallucinations. The patient is nervous/anxious.   All other systems reviewed and are negative.   Current Outpatient Medications on File Prior to Visit  Medication Sig Dispense Refill  . ALPRAZolam (XANAX) 0.25 MG tablet Take 0.5-1 tablets (  0.125-0.25 mg total) by mouth at bedtime as needed for anxiety. 30 tablet 1  . Ascorbic Acid (VITAMIN C) 100 MG tablet Take 100 mg by mouth daily.     . B Complex-C (SUPER B COMPLEX/VITAMIN C PO) Take by mouth daily.    . Biotin 1 MG CAPS Take by mouth daily.    . celecoxib (CELEBREX) 100 MG capsule Take 100 mg by mouth as needed.    . cetirizine (ZYRTEC) 10 MG chewable tablet Chew 10 mg by mouth daily as needed for allergies.    . cholecalciferol (VITAMIN D) 400 UNITS TABS tablet Take 1 tablet (400 Units total) by mouth daily. 90 tablet 1  . Influenza vac split quadrivalent PF (FLUZONE QUADRIVALENT) 0.5 ML injection Fluzone Quad 2018-19(PF) 60 mcg(15 mcgx4)/0.5 mL intramuscular syringe  TO BE ADMINISTERED BY PHARMACIST FOR IMMUNIZATION    . Nutritional Supplements (JUICE PLUS FIBRE) LIQD Take by mouth daily.    Marland Kitchen omeprazole (PRILOSEC) 20 MG capsule TAKE 1 CAPSULE BY MOUTH DAILY. 90 capsule 1  . simvastatin (ZOCOR) 40 MG tablet Take 1 tablet (40 mg total) by mouth at bedtime. 90 tablet 3  . SUMAtriptan (IMITREX) 100 MG tablet TAKE 1 TABLET BY MOUTH AS NEEDED FOR HEADACHE. MAY REPEAT IN 2 HOURS IF HEADACHE PERSISTS OR RECURS 10 tablet 3  . Zoster Vaccine Adjuvanted [facility]) injection 0.5 ml in muscle and repeat in 8 weeks 0.5 mL 1  . estradiol (VIVELLE-DOT) 0.05 MG/24HR patch APPLY 1 PATCH TWICE A WEEK  2  . fluticasone (FLONASE) 50 MCG/ACT nasal spray PLACE 1 SPRAY INTO BOTH NOSTRILS 2 (TWO) TIMES DAILY. 16 g 1  . Misc Natural Products (OSTEO BI-FLEX ADV JOINT SHIELD) TABS Take 2 tablets by mouth daily.    . Omega-3 Fatty Acids (OMEGA-3 FISH OIL) 1200 MG CAPS Take by mouth daily.     No current facility-administered medications on file prior to visit.      Past Medical History:  Diagnosis Date  . Basal cell adenocarcinoma   . Elevated cholesterol   . GERD without esophagitis   . Heartburn   . History of chicken pox   . Insomnia   . Migraine headache   . Osteoarthritis    Lumbar spine; hands; feet  . UTI (lower urinary tract infection)     Past Surgical History:  Procedure Laterality Date  . ABDOMINAL HYSTERECTOMY  1988  .  APPENDECTOMY  1988  . BASAL CELL CARCINOMA EXCISION     Multiple  . BLADDER REPAIR  2000   Mesh  . BREAST BIOPSY  1990  . CHOLECYSTECTOMY      Allergies  Allergen Reactions  . Other     Family History  Problem Relation Age of Onset  . Arthritis-Osteo Mother        Living  . Arthritis-Osteo Father        Living  . Diabetes Mother   . Diabetes Father   . Hyperlipidemia Mother   . Hypertension Mother   . Hypertension Father   . Heart attack Maternal Grandfather   . Heart attack Maternal Grandmother   . Alzheimer's disease Maternal Grandmother   . Pancreatic cancer Paternal Grandfather   . Dementia Paternal Grandmother   . Breast cancer Maternal Aunt   . Healthy Brother        x1  . Healthy Sister        x1  . Healthy Son        x2    Social History  Socioeconomic History  . Marital status: Married    Spouse name: Not on file  . Number of children: Not on file  . Years of education: Not on file  . Highest education level: Not on file  Occupational History  . Not on file  Social Needs  . Financial resource strain: Not on file  . Food insecurity    Worry: Not on file    Inability: Not on file  . Transportation needs    Medical: Not on file    Non-medical: Not on file  Tobacco Use  . Smoking status: Never Smoker  . Smokeless tobacco: Never Used  Substance and Sexual Activity  . Alcohol use: Yes    Alcohol/week: 2.0 standard drinks    Types: 2 Standard drinks or equivalent per week  . Drug use: No  . Sexual activity: Yes    Partners: Male  Lifestyle  . Physical activity    Days per week: Not on file    Minutes per session: Not on file  . Stress: Not on file  Relationships  . Social Herbalist on phone: Not on file    Gets together: Not on file    Attends religious service: Not on file    Active member of club or organization: Not on file    Attends meetings of clubs or organizations: Not on file    Relationship status: Not on file   Other Topics Concern  . Not on file  Social History Narrative   [place]    Vitals:   02/27/19 0943  BP: 110/82  Pulse: 70  Resp: 12  Temp: 98.1 F (36.7 C)  SpO2: 95%   Body mass index is 28.96 kg/m.   Wt Readings from Last 3 Encounters:  02/27/19 164 lb 12.8 oz (74.8 kg)  02/01/18 168 lb 4 oz (76.3 kg)  12/28/16 162 lb 4 oz (73.6 kg)    Physical Exam  Nursing note and vitals reviewed. Constitutional: She is oriented to person, place, and time. She appears well-developed. No distress.  HENT:  Head: Normocephalic and atraumatic.  Right Ear: Hearing, tympanic membrane, external ear and ear canal normal.  Left Ear: Hearing, tympanic membrane, external ear and ear canal normal.  Mouth/Throat: Uvula is midline, oropharynx is clear and moist and mucous membranes are normal.  Eyes: Pupils are equal, round, and reactive to light. Conjunctivae and EOM are normal.  Neck: No tracheal deviation present. No thyromegaly present.  Cardiovascular: Normal rate and regular rhythm.  No murmur heard. Pulses:      Dorsalis pedis pulses are 2+ on the right side and 2+ on the left side.  Respiratory: Effort normal and breath sounds normal. No respiratory distress.  GI: Soft. She exhibits no mass. There is no hepatomegaly. There is no abdominal tenderness.  Genitourinary:    Genitourinary Comments: Deferred to gyn.   Musculoskeletal:        General: No edema.     Comments: No major deformity or signs of synovitis appreciated.  Lymphadenopathy:    She has no cervical adenopathy.       Right: No supraclavicular adenopathy present.       Left: No supraclavicular adenopathy present.  Neurological: She is alert and oriented to person, place, and time. She has normal strength. No cranial nerve deficit. Coordination and gait normal.  Reflex Scores:      Bicep reflexes are 2+ on the right side and 2+ on the left side.  Patellar reflexes are 2+ on the right side and 2+ on the left  side. Skin: Skin is warm. No rash noted. No erythema.  Psychiatric: Her speech is normal. Her mood appears anxious. Cognition and memory are normal.  Well groomed, good eye contact.    ASSESSMENT AND PLAN:  Ms. Shaunette Gassner was here today annual physical examination.  Orders Placed This Encounter  Procedures  . CBC with Differential  . TSH  . Lipid panel  . Hemoglobin A1c  . Comprehensive metabolic panel  . C-reactive protein  . Sedimentation rate   Lab Results  Component Value Date   WBC 5.1 02/27/2019   HGB 14.3 02/27/2019   HCT 43.2 02/27/2019   MCV 89.3 02/27/2019   PLT 315.0 02/27/2019   Lab Results  Component Value Date   ESRSEDRATE 19 02/27/2019   Lab Results  Component Value Date   CRP <1.0 02/27/2019   Lab Results  Component Value Date   CHOL 248 (H) 02/27/2019   HDL 63.70 02/27/2019   LDLCALC 82 10/26/2016   LDLDIRECT 150.0 02/27/2019   TRIG 282.0 (H) 02/27/2019   CHOLHDL 4 02/27/2019   Lab Results  Component Value Date   HGBA1C 5.9 02/27/2019   Lab Results  Component Value Date   ALT 21 02/27/2019   AST 19 02/27/2019   ALKPHOS 67 02/27/2019   BILITOT 0.4 02/27/2019     Routine general medical examination at a health care facility We discussed the importance of regular physical activity and healthy diet for prevention of chronic illness and/or complications. Preventive guidelines reviewed. Vaccination up-to-date. Continue following with gynecology for her female preventive care. Ca++ and vit D supplementation to continue. Next CPE in a year.  The 10-year ASCVD risk score Mikey Bussing DC Brooke Bonito., et al., 2013) is: 3.2%   Values used to calculate the score:     Age: 44 years     Sex: Female     Is Non-Hispanic African American: No     Diabetic: No     Tobacco smoker: No     Systolic Blood Pressure: 732 mmHg     Is BP treated: No     HDL Cholesterol: 63.7 mg/dL     Total Cholesterol: 248 mg/dL  Other fatigue We discussed possible etiologies:  Systemic illness, immunologic,endocrinology,sleep disorder, psychiatric/psychologic, infectious,medications side effects, and idiopathic. Examination today does not suggest a serious process. Healthy diet and regular physical activity may help.  Further recommendations will be given according to lab results.  -     CBC with Differential -     TSH -     C-reactive protein -     Sedimentation rate  Screening for endocrine, metabolic and immunity disorder -     Hemoglobin A1c -     Comprehensive metabolic panel  Myalgia We discussed possible etiologies. On examination today there are no tender trigger points. Further recommendation will be given according to lab results. Stretching exercises may help.  -     C-reactive protein -     Sedimentation rate  Hyperlipidemia No changes in current management, will follow labs done today and will give further recommendations accordingly.   Gastroesophageal reflux disease without esophagitis Problem is well controlled. Continue omeprazole 20 mg daily. GERD precautions also recommended.  Generalized anxiety disorder Mildly worse due to financial stress. Continue Prozac 20 mg daily. Trazodone 25 to 50 mg added today may also help. We discussed side effects of medications and the risk of medication interaction. No changes  in alprazolam 0.25 mg daily as needed. Instructed about warning signs. Follow-up in 2 to 3 months.   Insomnia After discussion of pharmacologic treatment options, she agrees with trying trazodone 25 to 50 mg daily at bedtime. If trazodone 50 mg does not help, will consider doxepin. Good sleep hygiene. We discussed side effects of medication. Follow-up in 2 to 3 months.    Return in 3 months (on 05/30/2019).   Opie Fanton G. [place], MD  [facility]. [place] office.

## 2019-02-27 ENCOUNTER — Other Ambulatory Visit: Payer: Self-pay

## 2019-02-27 ENCOUNTER — Ambulatory Visit (INDEPENDENT_AMBULATORY_CARE_PROVIDER_SITE_OTHER): Payer: BC Managed Care – PPO | Admitting: Family Medicine

## 2019-02-27 ENCOUNTER — Encounter: Payer: Self-pay | Admitting: Family Medicine

## 2019-02-27 VITALS — BP 110/82 | HR 70 | Temp 98.1°F | Resp 12 | Ht 63.25 in | Wt 164.8 lb

## 2019-02-27 DIAGNOSIS — G4700 Insomnia, unspecified: Secondary | ICD-10-CM | POA: Diagnosis not present

## 2019-02-27 DIAGNOSIS — Z13228 Encounter for screening for other metabolic disorders: Secondary | ICD-10-CM | POA: Diagnosis not present

## 2019-02-27 DIAGNOSIS — Z1329 Encounter for screening for other suspected endocrine disorder: Secondary | ICD-10-CM | POA: Diagnosis not present

## 2019-02-27 DIAGNOSIS — K219 Gastro-esophageal reflux disease without esophagitis: Secondary | ICD-10-CM

## 2019-02-27 DIAGNOSIS — Z0001 Encounter for general adult medical examination with abnormal findings: Secondary | ICD-10-CM

## 2019-02-27 DIAGNOSIS — Z130 Encounter for screening for diseases of the blood and blood-forming organs and certain disorders involving the immune mechanism: Secondary | ICD-10-CM | POA: Diagnosis not present

## 2019-02-27 DIAGNOSIS — R5383 Other fatigue: Secondary | ICD-10-CM

## 2019-02-27 DIAGNOSIS — E782 Mixed hyperlipidemia: Secondary | ICD-10-CM | POA: Diagnosis not present

## 2019-02-27 DIAGNOSIS — Z0000 Encounter for general adult medical examination without abnormal findings: Secondary | ICD-10-CM

## 2019-02-27 DIAGNOSIS — M791 Myalgia, unspecified site: Secondary | ICD-10-CM

## 2019-02-27 DIAGNOSIS — F411 Generalized anxiety disorder: Secondary | ICD-10-CM

## 2019-02-27 LAB — TSH: TSH: 0.94 u[IU]/mL (ref 0.35–4.50)

## 2019-02-27 LAB — CBC WITH DIFFERENTIAL/PLATELET
Basophils Absolute: 0.1 10*3/uL (ref 0.0–0.1)
Basophils Relative: 1 % (ref 0.0–3.0)
Eosinophils Absolute: 0.1 10*3/uL (ref 0.0–0.7)
Eosinophils Relative: 2.1 % (ref 0.0–5.0)
HCT: 43.2 % (ref 36.0–46.0)
Hemoglobin: 14.3 g/dL (ref 12.0–15.0)
Lymphocytes Relative: 40.9 % (ref 12.0–46.0)
Lymphs Abs: 2.1 10*3/uL (ref 0.7–4.0)
MCHC: 33.1 g/dL (ref 30.0–36.0)
MCV: 89.3 fl (ref 78.0–100.0)
Monocytes Absolute: 0.3 10*3/uL (ref 0.1–1.0)
Monocytes Relative: 6.3 % (ref 3.0–12.0)
Neutro Abs: 2.5 10*3/uL (ref 1.4–7.7)
Neutrophils Relative %: 49.7 % (ref 43.0–77.0)
Platelets: 315 10*3/uL (ref 150.0–400.0)
RBC: 4.84 Mil/uL (ref 3.87–5.11)
RDW: 13.3 % (ref 11.5–15.5)
WBC: 5.1 10*3/uL (ref 4.0–10.5)

## 2019-02-27 LAB — HEMOGLOBIN A1C: Hgb A1c MFr Bld: 5.9 % (ref 4.6–6.5)

## 2019-02-27 LAB — SEDIMENTATION RATE: Sed Rate: 19 mm/hr (ref 0–30)

## 2019-02-27 MED ORDER — FLUOXETINE HCL 20 MG PO CAPS: ORAL_CAPSULE | ORAL | 1 refills | Status: DC

## 2019-02-27 MED ORDER — TRAZODONE HCL 50 MG PO TABS: 25.0000 mg | ORAL_TABLET | Freq: Every evening | ORAL | 2 refills | Status: DC | PRN

## 2019-02-27 NOTE — Assessment & Plan Note (Signed)
No changes in current management, will follow labs done today and will give further recommendations accordingly.  

## 2019-02-27 NOTE — Patient Instructions (Signed)
A few things to remember from today's visit:   Routine general medical examination at a health care facility  Mixed hyperlipidemia - Plan: Lipid panel  Other fatigue - Plan: CBC with Differential, TSH, C-reactive protein, Sedimentation rate  Insomnia, unspecified type - Plan: traZODone (DESYREL) 50 MG tablet  Screening for endocrine, metabolic and immunity disorder - Plan: Hemoglobin A1c, Comprehensive metabolic panel  Myalgia - Plan: C-reactive protein, Sedimentation rate  Trazodone 50 mg added today. Video visit in 2-3 months.  No changes in rest of your meds.   Please be sure medication list is accurate. If a new problem present, please set up appointment sooner than planned today.

## 2019-02-27 NOTE — Assessment & Plan Note (Signed)
Mildly worse due to financial stress. Continue Prozac 20 mg daily. Trazodone 25 to 50 mg added today may also help. We discussed side effects of medications and the risk of medication interaction. No changes in alprazolam 0.25 mg daily as needed. Instructed about warning signs. Follow-up in 2 to 3 months.

## 2019-02-27 NOTE — Assessment & Plan Note (Signed)
After discussion of pharmacologic treatment options, she agrees with trying trazodone 25 to 50 mg daily at bedtime. If trazodone 50 mg does not help, will consider doxepin. Good sleep hygiene. We discussed side effects of medication. Follow-up in 2 to 3 months.

## 2019-02-27 NOTE — Assessment & Plan Note (Signed)
Problem is well controlled. Continue omeprazole 20 mg daily. GERD precautions also recommended. 

## 2019-02-28 LAB — COMPREHENSIVE METABOLIC PANEL
ALT: 21 U/L (ref 0–35)
AST: 19 U/L (ref 0–37)
Albumin: 4.7 g/dL (ref 3.5–5.2)
Alkaline Phosphatase: 67 U/L (ref 39–117)
BUN: 17 mg/dL (ref 6–23)
CO2: 28 mEq/L (ref 19–32)
Calcium: 10.2 mg/dL (ref 8.4–10.5)
Chloride: 102 mEq/L (ref 96–112)
Creatinine, Ser: 0.91 mg/dL (ref 0.40–1.20)
GFR: 62.47 mL/min (ref >60.00)
Glucose, Bld: 98 mg/dL (ref 70–99)
Potassium: 4.4 mEq/L (ref 3.5–5.1)
Sodium: 139 mEq/L (ref 135–145)
Total Bilirubin: 0.4 mg/dL (ref 0.2–1.2)
Total Protein: 7.3 g/dL (ref 6.0–8.3)

## 2019-02-28 LAB — LIPID PANEL
Cholesterol: 248 mg/dL — ABNORMAL HIGH (ref 0–200)
HDL: 63.7 mg/dL (ref >39.00)
NonHDL: 184.29
Total CHOL/HDL Ratio: 4
Triglycerides: 282 mg/dL — ABNORMAL HIGH (ref 0.0–149.0)
VLDL: 56.4 mg/dL — ABNORMAL HIGH (ref 0.0–40.0)

## 2019-02-28 LAB — C-REACTIVE PROTEIN: CRP: <1 mg/dL (ref 0.5–20.0)

## 2019-02-28 LAB — LDL CHOLESTEROL, DIRECT: Direct LDL: 150 mg/dL

## 2019-03-01 ENCOUNTER — Other Ambulatory Visit: Payer: Self-pay | Admitting: *Deleted

## 2019-03-01 MED ORDER — LOVASTATIN 40 MG PO TABS: 40.0000 mg | ORAL_TABLET | Freq: Every day | ORAL | 3 refills | Status: DC

## 2019-03-24 ENCOUNTER — Encounter: Payer: BLUE CROSS/BLUE SHIELD | Admitting: Family Medicine

## 2019-04-16 ENCOUNTER — Encounter: Payer: Self-pay | Admitting: Family Medicine

## 2019-05-03 ENCOUNTER — Other Ambulatory Visit: Payer: Self-pay | Admitting: Family Medicine

## 2019-06-12 ENCOUNTER — Other Ambulatory Visit: Payer: Self-pay | Admitting: Family Medicine

## 2019-06-24 ENCOUNTER — Other Ambulatory Visit: Payer: Self-pay

## 2019-06-24 ENCOUNTER — Ambulatory Visit (INDEPENDENT_AMBULATORY_CARE_PROVIDER_SITE_OTHER): Payer: BC Managed Care – PPO

## 2019-06-24 DIAGNOSIS — Z23 Encounter for immunization: Secondary | ICD-10-CM | POA: Diagnosis not present

## 2019-07-31 ENCOUNTER — Other Ambulatory Visit: Payer: Self-pay | Admitting: Family Medicine

## 2019-07-31 DIAGNOSIS — F411 Generalized anxiety disorder: Secondary | ICD-10-CM

## 2019-09-12 ENCOUNTER — Other Ambulatory Visit: Payer: Self-pay | Admitting: Obstetrics and Gynecology

## 2019-09-12 DIAGNOSIS — R928 Other abnormal and inconclusive findings on diagnostic imaging of breast: Secondary | ICD-10-CM

## 2019-09-20 ENCOUNTER — Ambulatory Visit
Admission: RE | Admit: 2019-09-20 | Discharge: 2019-09-20 | Disposition: A | Discharge status: Home / Self Care | Payer: BC Managed Care – PPO | Source: Ambulatory Visit | Attending: Obstetrics and Gynecology | Admitting: Obstetrics and Gynecology

## 2019-09-20 ENCOUNTER — Ambulatory Visit: Payer: BC Managed Care – PPO

## 2019-09-20 ENCOUNTER — Other Ambulatory Visit: Payer: Self-pay

## 2019-09-20 DIAGNOSIS — R928 Other abnormal and inconclusive findings on diagnostic imaging of breast: Secondary | ICD-10-CM

## 2019-11-28 ENCOUNTER — Other Ambulatory Visit: Payer: Self-pay | Admitting: Family Medicine

## 2020-01-24 ENCOUNTER — Other Ambulatory Visit: Payer: Self-pay | Admitting: Family Medicine

## 2020-01-24 DIAGNOSIS — F411 Generalized anxiety disorder: Secondary | ICD-10-CM

## 2020-02-28 ENCOUNTER — Other Ambulatory Visit: Payer: Self-pay | Admitting: Family Medicine

## 2020-02-28 DIAGNOSIS — F411 Generalized anxiety disorder: Secondary | ICD-10-CM

## 2020-04-03 ENCOUNTER — Other Ambulatory Visit: Payer: Self-pay | Admitting: Family Medicine

## 2020-04-03 DIAGNOSIS — F411 Generalized anxiety disorder: Secondary | ICD-10-CM

## 2020-04-29 ENCOUNTER — Other Ambulatory Visit: Payer: Self-pay | Admitting: Family Medicine

## 2020-05-02 ENCOUNTER — Other Ambulatory Visit: Payer: Self-pay | Admitting: Family Medicine

## 2020-05-13 ENCOUNTER — Encounter: Payer: Self-pay | Admitting: Family Medicine

## 2020-05-13 DIAGNOSIS — F411 Generalized anxiety disorder: Secondary | ICD-10-CM

## 2020-05-13 MED ORDER — FLUOXETINE HCL 20 MG PO CAPS: 20.0000 mg | ORAL_CAPSULE | Freq: Every day | ORAL | 0 refills | Status: DC

## 2020-06-18 ENCOUNTER — Encounter: Payer: Self-pay | Admitting: Family Medicine

## 2020-06-18 ENCOUNTER — Other Ambulatory Visit: Payer: Self-pay

## 2020-06-18 ENCOUNTER — Ambulatory Visit (INDEPENDENT_AMBULATORY_CARE_PROVIDER_SITE_OTHER): Payer: 59 | Admitting: Family Medicine

## 2020-06-18 VITALS — BP 128/80 | HR 75 | Temp 98.0°F | Resp 16 | Ht 63.25 in | Wt 168.4 lb

## 2020-06-18 DIAGNOSIS — Z130 Encounter for screening for diseases of the blood and blood-forming organs and certain disorders involving the immune mechanism: Secondary | ICD-10-CM

## 2020-06-18 DIAGNOSIS — E782 Mixed hyperlipidemia: Secondary | ICD-10-CM

## 2020-06-18 DIAGNOSIS — Z13228 Encounter for screening for other metabolic disorders: Secondary | ICD-10-CM

## 2020-06-18 DIAGNOSIS — Z23 Encounter for immunization: Secondary | ICD-10-CM | POA: Diagnosis not present

## 2020-06-18 DIAGNOSIS — G4700 Insomnia, unspecified: Secondary | ICD-10-CM

## 2020-06-18 DIAGNOSIS — Z1329 Encounter for screening for other suspected endocrine disorder: Secondary | ICD-10-CM

## 2020-06-18 DIAGNOSIS — Z0000 Encounter for general adult medical examination without abnormal findings: Secondary | ICD-10-CM

## 2020-06-18 DIAGNOSIS — F411 Generalized anxiety disorder: Secondary | ICD-10-CM

## 2020-06-18 DIAGNOSIS — K219 Gastro-esophageal reflux disease without esophagitis: Secondary | ICD-10-CM

## 2020-06-18 MED ORDER — FLUOXETINE HCL 20 MG PO CAPS: 20.0000 mg | ORAL_CAPSULE | Freq: Every day | ORAL | 0 refills | Status: DC

## 2020-06-18 MED ORDER — FLUOXETINE HCL 10 MG PO CAPS: 10.0000 mg | ORAL_CAPSULE | Freq: Every day | ORAL | 1 refills | Status: DC

## 2020-06-18 MED ORDER — ALPRAZOLAM 0.25 MG PO TABS: 0.1250 mg | ORAL_TABLET | Freq: Every evening | ORAL | 1 refills | Status: DC | PRN

## 2020-06-18 NOTE — Assessment & Plan Note (Signed)
Still symptomatic. We discussed some side effects of PPI's as well as complications of persistent acid reflux.  Recommend resuming Omeprazole 20 mg daily, she can try q 2 days. GERD precautions.

## 2020-06-18 NOTE — Assessment & Plan Note (Signed)
Continue Lovastatin 40 mg daily and low fat diet. Further recommendations according to FLP results. 

## 2020-06-18 NOTE — Progress Notes (Signed)
HPI: Kathryn Cummings is a pleasant 64 y.o. female, who is here today for her routine physical.  Last CPE: 02/27/19  Regular exercise 3 or more time per week: She is active around the house. Following a healthy diet: Yes She is cooking at home more frequent. She lives with her husband.  Chronic medical problems: Migraine,GERD,HLD,and insomnia among some.  Pap smear:S/P hysterectomy. Follows with gyn regularly.  Immunization History  Administered Date(s) Administered  . Influenza,inj,Quad PF,6+ Mos 09/17/2014, 06/26/2015, 06/17/2016, 07/20/2017, 06/10/2018, 06/24/2019  . Influenza-Unspecified 05/17/2018  . Tdap 02/01/2018  . Zoster Recombinat (Shingrix) 02/04/2018, 06/30/2018   Mammogram: 09/20/19. Colonoscopy: 01/31/15, 5 years f/u was recommended. She already received a letter and planning on scheduling procedure. DEXA: N/A  Hep C screening: 02/01/18 NR.  Concerns and follow up today.  Right heel mass. Noted a few days ago when she was trying to put her boots on. Has not noted growth. There is no pain or skin changes. It is not limiting ROM.  HLD: She is on Lovastatin 40 mg daily..  Lab Results  Component Value Date   CHOL 248 (H) 02/27/2019   HDL 63.70 02/27/2019   LDLCALC 82 10/26/2016   LDLDIRECT 150.0 02/27/2019   TRIG 282.0 (H) 02/27/2019   CHOLHDL 4 02/27/2019   GERD:She stopped Omeprazole 20 mg  4-5 months ago. She was afraid of side effects. She is taking Pepcid 20 mg bid. Reflux is "not a lot of better." She has not noted abdominal pain or melena.  Anxiety:She has been on Prozac for 5-6 years, not sure if it is helping. Her father died in 05-23-19. She has been under some stress. Tolerating medication well. No depression like symptoms.  Insomnia: She was on Alprazolam 0.25 mg , which helped. OTC sleep aids do not help. She took triazolam 0.25 mg, given by her dentist to take before dental procedure. She slept very well and felt rested the next  day.  Review of Systems  Constitutional: Positive for fatigue. Negative for appetite change and fever.  HENT: Negative for hearing loss, mouth sores, sore throat and trouble swallowing.   Eyes: Negative for redness and visual disturbance.  Respiratory: Negative for cough, shortness of breath and wheezing.   Cardiovascular: Negative for chest pain and leg swelling.  Gastrointestinal: Negative for nausea and vomiting.       No changes in bowel habits.  Endocrine: Negative for cold intolerance, heat intolerance, polydipsia, polyphagia and polyuria.  Genitourinary: Negative for decreased urine volume, dysuria, hematuria, vaginal bleeding and vaginal discharge.  Musculoskeletal: Negative for gait problem and myalgias.  Skin: Negative for color change and rash.  Allergic/Immunologic: Positive for environmental allergies.  Neurological: Positive for headaches (no more than usual). Negative for syncope and weakness.  Hematological: Negative for adenopathy. Does not bruise/bleed easily.  Psychiatric/Behavioral: Positive for sleep disturbance. Negative for confusion.  All other systems reviewed and are negative.  Current Outpatient Medications on File Prior to Visit  Medication Sig Dispense Refill  . Ascorbic Acid (VITAMIN C) 100 MG tablet Take 100 mg by mouth daily.    . B Complex-C (SUPER B COMPLEX/VITAMIN C PO) Take by mouth daily.    . Biotin 1 MG CAPS Take by mouth daily.    . cetirizine (ZYRTEC) 10 MG chewable tablet Chew 10 mg by mouth daily as needed for allergies.    . cholecalciferol (VITAMIN D) 400 UNITS TABS tablet Take 1 tablet (400 Units total) by mouth daily. 90 tablet 1  . lovastatin (  MEVACOR) 40 MG tablet TAKE 1 TABLET BY MOUTH EVERYDAY AT BEDTIME 90 tablet 3  . Nutritional Supplements (JUICE PLUS FIBRE) LIQD Take by mouth daily.    Marland Kitchen omeprazole (PRILOSEC) 20 MG capsule TAKE 1 CAPSULE (20 MG TOTAL) BY MOUTH DAILY. SCHEDULE FOLLOW UP APPOINTMENT FOR REFILLS. 90 capsule 2  .  SUMAtriptan (IMITREX) 100 MG tablet TAKE 1 TABLET BY MOUTH AS NEEDED FOR HEADACHE. MAY REPEAT IN 2 HOURS IF HEADACHE PERSISTS OR RECURS 10 tablet 3  . fluticasone (FLONASE) 50 MCG/ACT nasal spray PLACE 1 SPRAY INTO BOTH NOSTRILS 2 (TWO) TIMES DAILY. 16 g 1   No current facility-administered medications on file prior to visit.     Past Medical History:  Diagnosis Date  . Basal cell adenocarcinoma   . Elevated cholesterol   . GERD without esophagitis   . Heartburn   . History of chicken pox   . Insomnia   . Migraine headache   . Osteoarthritis    Lumbar spine; hands; feet  . UTI (lower urinary tract infection)     Past Surgical History:  Procedure Laterality Date  . ABDOMINAL HYSTERECTOMY  1988  . APPENDECTOMY  1988  . BASAL CELL CARCINOMA EXCISION     Multiple  . BLADDER REPAIR  2000   Mesh  . BREAST BIOPSY  1990  . CHOLECYSTECTOMY      Allergies  Allergen Reactions  . Other     Family History  Problem Relation Age of Onset  . Arthritis-Osteo Mother        Living  . Diabetes Mother   . Hyperlipidemia Mother   . Hypertension Mother   . Arthritis-Osteo Father        Living  . Diabetes Father   . Hypertension Father   . Heart attack Maternal Grandfather   . Heart attack Maternal Grandmother   . Alzheimer's disease Maternal Grandmother   . Pancreatic cancer Paternal Grandfather   . Dementia Paternal Grandmother   . Breast cancer Maternal Aunt   . Healthy Brother        x1  . Healthy Sister        x1  . Healthy Son        x2    Social History   Socioeconomic History  . Marital status: Married    Spouse name: Not on file  . Number of children: Not on file  . Years of education: Not on file  . Highest education level: Not on file  Occupational History  . Not on file  Tobacco Use  . Smoking status: Never Smoker  . Smokeless tobacco: Never Used  Substance and Sexual Activity  . Alcohol use: Yes    Alcohol/week: 2.0 standard drinks    Types: 2  Standard drinks or equivalent per week  . Drug use: No  . Sexual activity: Yes    Partners: Male  Other Topics Concern  . Not on file  Social History Narrative   [place].   Social Determinants of Health   Financial Resource Strain:   . Difficulty of Paying Living Expenses: Not on file  Food Insecurity:   . Worried About Charity fundraiser in the Last Year: Not on file  . Ran Out of Food in the Last Year: Not on file  Transportation Needs:   . Lack of Transportation (Medical): Not on file  . Lack of Transportation (Non-Medical): Not on file  Physical Activity:   . Days of Exercise per Week: Not on  file  . Minutes of Exercise per Session: Not on file  Stress:   . Feeling of Stress : Not on file  Social Connections:   . Frequency of Communication with Friends and Family: Not on file  . Frequency of Social Gatherings with Friends and Family: Not on file  . Attends Religious Services: Not on file  . Active Member of Clubs or Organizations: Not on file  . Attends Archivist Meetings: Not on file  . Marital Status: Not on file     Vitals:   06/18/20 0941  BP: 128/80  Pulse: 75  Resp: 16  Temp: 98 F (36.7 C)  SpO2: 97%   Body mass index is 29.59 kg/m.   Wt Readings from Last 3 Encounters:  06/18/20 168 lb 6 oz (76.4 kg)  02/27/19 164 lb 12.8 oz (74.8 kg)  02/01/18 168 lb 4 oz (76.3 kg)     Physical Exam Vitals and nursing note reviewed.  Constitutional:      General: She is not in acute distress.    Appearance: She is well-developed.  HENT:     Head: Normocephalic and atraumatic.     Right Ear: Hearing, tympanic membrane, ear canal and external ear normal.     Left Ear: Hearing, tympanic membrane, ear canal and external ear normal.     Mouth/Throat:     Mouth: Mucous membranes are moist.     Pharynx: Oropharynx is clear. Uvula midline.  Eyes:     Extraocular Movements: Extraocular movements intact.     Conjunctiva/sclera: Conjunctivae  normal.     Pupils: Pupils are equal, round, and reactive to light.  Neck:     Thyroid: No thyromegaly.     Trachea: No tracheal deviation.  Cardiovascular:     Rate and Rhythm: Normal rate and regular rhythm.     Pulses:          Dorsalis pedis pulses are 2+ on the right side and 2+ on the left side.     Heart sounds: No murmur heard.   Pulmonary:     Effort: Pulmonary effort is normal. No respiratory distress.     Breath sounds: Normal breath sounds.  Abdominal:     Palpations: Abdomen is soft. There is no hepatomegaly or mass.     Tenderness: There is no abdominal tenderness.  Genitourinary:    Comments: Deferred to gyn. Musculoskeletal:       Feet:     Comments: No signs of synovitis appreciated.  Feet:     Comments: Posterior aspect of right heel with a firm nodular,not mobile lesion. No skin  Lymphadenopathy:     Cervical: No cervical adenopathy.     Upper Body:     Right upper body: No supraclavicular adenopathy.     Left upper body: No supraclavicular adenopathy.  Skin:    General: Skin is warm.     Findings: No erythema or rash.  Neurological:     General: No focal deficit present.     Mental Status: She is alert and oriented to person, place, and time.     Cranial Nerves: No cranial nerve deficit.     Coordination: Coordination normal.     Gait: Gait normal.     Deep Tendon Reflexes:     Reflex Scores:      Bicep reflexes are 2+ on the right side and 2+ on the left side.      Patellar reflexes are 2+ on the right side and  2+ on the left side. Psychiatric:        Mood and Affect: Mood and affect normal.     Comments: Well groomed, good eye contact.    ASSESSMENT AND PLAN:  Kathryn Cummings was here today annual physical examination.  Orders Placed This Encounter  Procedures  . Flu Vaccine QUAD 36+ mos IM  . COMPLETE METABOLIC PANEL WITH GFR  . Lipid panel  . Hemoglobin A1c   Lab Results  Component Value Date   HGBA1C 5.9 (H) 06/18/2020   Lab  Results  Component Value Date   CREATININE 1.03 (H) 06/18/2020   BUN 14 06/18/2020   NA 136 06/18/2020   K 4.2 06/18/2020   CL 100 06/18/2020   CO2 25 06/18/2020   Lab Results  Component Value Date   ALT 30 (H) 06/18/2020   AST 26 06/18/2020   ALKPHOS 67 02/27/2019   BILITOT 0.6 06/18/2020   Lab Results  Component Value Date   CHOL 265 (H) 06/18/2020   HDL 59 06/18/2020   LDLCALC 154 (H) 06/18/2020   LDLDIRECT 150.0 02/27/2019   TRIG 335 (H) 06/18/2020   CHOLHDL 4.5 06/18/2020   Serum creatinine: 1.03 mg/dL (H) 06/18/20 1042 Estimated creatinine clearance: 54.4 mL/min (A)  Routine general medical examination at a health care facility We discussed the importance of regular physical activity and healthy diet for prevention of chronic illness and/or complications. Preventive guidelines reviewed. Vaccination up to date. Female preventive care with gyn to continue. Ca++ and vit D supplementation recommended. Next CPE in a year.  The 10-year ASCVD risk score Mikey Bussing DC Brooke Bonito., et al., 2013) is: 5.7%   Values used to calculate the score:     Age: 58 years     Sex: Female     Is Non-Hispanic African American: No     Diabetic: No     Tobacco smoker: No     Systolic Blood Pressure: 315 mmHg     Is BP treated: No     HDL Cholesterol: 59 mg/dL     Total Cholesterol: 265 mg/dL  Screening for endocrine, metabolic and immunity disorder -     Hemoglobin A1c -     COMPLETE METABOLIC PANEL WITH GFR  Insomnia, unspecified type Alprazolam 0.25 mg 1/2-1 tab at bedtime as needed. Good sleep hygiene.  Generalized anxiety disorder Prozac increased from 20 mg to 30 mg daily. If no changes in 6-8 weeks,she can go back to 20 mg. Alprazolam 0.25 mg resumed, 1 tab daily as needed.  Hyperlipidemia Continue Lovastatin 40 mg daily and low fat diet. Further recommendations according to FLP results.  Gastroesophageal reflux disease without esophagitis Still symptomatic. We discussed some  side effects of PPI's as well as complications of persistent acid reflux.  Recommend resuming Omeprazole 20 mg daily, she can try q 2 days. GERD precautions.   Need for influenza vaccination -     Flu Vaccine QUAD 36+ mos IM  Return in 3 months (on 09/18/2020).   Arlin Sass G. [place], MD  [facility]. [place] office.  Today you have you routine preventive visit. A few things to remember from today's visit:   Need for influenza vaccination - Plan: Flu Vaccine QUAD 36+ mos IM  Generalized anxiety disorder - Plan: FLUoxetine (PROZAC) 20 MG capsule, FLUoxetine (PROZAC) 10 MG capsule, ALPRAZolam (XANAX) 0.25 MG tablet  Routine general medical examination at a health care facility  Screening for endocrine, metabolic and immunity disorder - Plan: COMPLETE METABOLIC PANEL WITH  GFR, Hemoglobin A1c  Mixed hyperlipidemia - Plan: Lipid panel  If you need refills please call your pharmacy. Do not use My Chart to request refills or for acute issues that need immediate attention.   Please be sure medication list is accurate. If a new problem present, please set up appointment sooner than planned today.  At least 150 minutes of moderate exercise per week, daily brisk walking for 15-30 min is a good exercise option. Healthy diet low in saturated (animal) fats and sweets and consisting of fresh fruits and vegetables, lean meats such as fish and white chicken and whole grains.  These are some of recommendations for screening depending of age and risk factors:  - Vaccines:  Tdap vaccine every 10 years.  Shingles vaccine recommended at age 69, could be given after 64 years of age but not sure about insurance coverage.   Pneumonia vaccines: Pneumovax at 2. Sometimes Pneumovax is giving earlier if history of smoking, lung disease,diabetes,kidney disease among some.  Screening for diabetes at age 44 and every 3 years.  Cervical cancer prevention:  Pap smear starts at 64 years of age  and continues periodically until 64 years old in low risk women. Pap smear every 3 years between 9 and 76 years old. Pap smear every 3-5 years between women 66 and older if pap smear negative and HPV screening negative.   -Breast cancer: Mammogram: There is disagreement between experts about when to start screening in low risk asymptomatic female but recent recommendations are to start screening at 62 and not later than 64 years old , every 1-2 years and after 64 yo q 2 years. Screening is recommended until 64 years old but some women can continue screening depending of healthy issues.  Colon cancer screening: Has been recently changed to 64 yo. Insurance may not cover until you are 64 years old. Screening is recommended until 63 years old.  Cholesterol disorder screening at age 90 and every 3 years.N/A  Also recommended:  1. Dental visit- Brush and floss your teeth twice daily; visit your dentist twice a year. 2. Eye doctor- Get an eye exam at least every 2 years. 3. Helmet use- Always wear a helmet when riding a bicycle, motorcycle, rollerblading or skateboarding. 4. Safe sex- If you may be exposed to sexually transmitted infections, use a condom. 5. Seat belts- Seat belts can save your live; always wear one. 6. Smoke/Carbon Monoxide detectors- These detectors need to be installed on the appropriate level of your home. Replace batteries at least once a year. 7. Skin cancer- When out in the sun please cover up and use sunscreen 15 SPF or higher. 8. Violence- If anyone is threatening or hurting you, please tell your healthcare provider.  9. Drink alcohol in moderation- Limit alcohol intake to one drink or less per day. Never drink and drive. 10. Calcium supplementation 1000 to 1200 mg daily, ideally through your diet.  Vitamin D supplementation 800 units daily.

## 2020-06-18 NOTE — Assessment & Plan Note (Signed)
Prozac increased from 20 mg to 30 mg daily. If no changes in 6-8 weeks,she can go back to 20 mg. Alprazolam 0.25 mg resumed, 1 tab daily as needed.

## 2020-06-18 NOTE — Patient Instructions (Signed)
Today you have you routine preventive visit. A few things to remember from today's visit:   Need for influenza vaccination - Plan: Flu Vaccine QUAD 36+ mos IM  Generalized anxiety disorder - Plan: FLUoxetine (PROZAC) 20 MG capsule, FLUoxetine (PROZAC) 10 MG capsule, ALPRAZolam (XANAX) 0.25 MG tablet  Routine general medical examination at a health care facility  Screening for endocrine, metabolic and immunity disorder - Plan: COMPLETE METABOLIC PANEL WITH GFR, Hemoglobin A1c  Mixed hyperlipidemia - Plan: Lipid panel  If you need refills please call your pharmacy. Do not use My Chart to request refills or for acute issues that need immediate attention.   Please be sure medication list is accurate. If a new problem present, please set up appointment sooner than planned today.  At least 150 minutes of moderate exercise per week, daily brisk walking for 15-30 min is a good exercise option. Healthy diet low in saturated (animal) fats and sweets and consisting of fresh fruits and vegetables, lean meats such as fish and white chicken and whole grains.  These are some of recommendations for screening depending of age and risk factors:  - Vaccines:  Tdap vaccine every 10 years.  Shingles vaccine recommended at age 64, could be given after 64 years of age but not sure about insurance coverage.   Pneumonia vaccines: Pneumovax at 48. Sometimes Pneumovax is giving earlier if history of smoking, lung disease,diabetes,kidney disease among some.  Screening for diabetes at age 73 and every 3 years.  Cervical cancer prevention:  Pap smear starts at 64 years of age and continues periodically until 64 years old in low risk women. Pap smear every 3 years between 67 and 38 years old. Pap smear every 3-5 years between women 50 and older if pap smear negative and HPV screening negative.   -Breast cancer: Mammogram: There is disagreement between experts about when to start screening in low risk  asymptomatic female but recent recommendations are to start screening at 55 and not later than 64 years old , every 1-2 years and after 64 yo q 2 years. Screening is recommended until 64 years old but some women can continue screening depending of healthy issues.  Colon cancer screening: Has been recently changed to 64 yo. Insurance may not cover until you are 64 years old. Screening is recommended until 64 years old.  Cholesterol disorder screening at age 67 and every 3 years.N/A  Also recommended:  1. Dental visit- Brush and floss your teeth twice daily; visit your dentist twice a year. 2. Eye doctor- Get an eye exam at least every 2 years. 3. Helmet use- Always wear a helmet when riding a bicycle, motorcycle, rollerblading or skateboarding. 4. Safe sex- If you may be exposed to sexually transmitted infections, use a condom. 5. Seat belts- Seat belts can save your live; always wear one. 6. Smoke/Carbon Monoxide detectors- These detectors need to be installed on the appropriate level of your home. Replace batteries at least once a year. 7. Skin cancer- When out in the sun please cover up and use sunscreen 15 SPF or higher. 8. Violence- If anyone is threatening or hurting you, please tell your healthcare provider.  9. Drink alcohol in moderation- Limit alcohol intake to one drink or less per day. Never drink and drive. 10. Calcium supplementation 1000 to 1200 mg daily, ideally through your diet.  Vitamin D supplementation 800 units daily.

## 2020-06-19 LAB — LIPID PANEL
Cholesterol: 265 mg/dL — ABNORMAL HIGH (ref <200)
HDL: 59 mg/dL (ref >=50)
LDL Cholesterol (Calc): 154 mg/dL (calc) — ABNORMAL HIGH
Non-HDL Cholesterol (Calc): 206 mg/dL (calc) — ABNORMAL HIGH (ref <130)
Total CHOL/HDL Ratio: 4.5 (calc) (ref <5.0)
Triglycerides: 335 mg/dL — ABNORMAL HIGH (ref <150)

## 2020-06-19 LAB — COMPLETE METABOLIC PANEL WITH GFR
AG Ratio: 1.7 (calc) (ref 1.0–2.5)
ALT: 30 U/L — ABNORMAL HIGH (ref 6–29)
AST: 26 U/L (ref 10–35)
Albumin: 4.6 g/dL (ref 3.6–5.1)
Alkaline phosphatase (APISO): 62 U/L (ref 37–153)
BUN/Creatinine Ratio: 14 (calc) (ref 6–22)
BUN: 14 mg/dL (ref 7–25)
CO2: 25 mmol/L (ref 20–32)
Calcium: 10.2 mg/dL (ref 8.6–10.4)
Chloride: 100 mmol/L (ref 98–110)
Creat: 1.03 mg/dL — ABNORMAL HIGH (ref 0.50–0.99)
GFR, Est African American: 67 mL/min/{1.73_m2} (ref >=60)
GFR, Est Non African American: 57 mL/min/{1.73_m2} — ABNORMAL LOW (ref >=60)
Globulin: 2.7 g/dL (calc) (ref 1.9–3.7)
Glucose, Bld: 111 mg/dL — ABNORMAL HIGH (ref 65–99)
Potassium: 4.2 mmol/L (ref 3.5–5.3)
Sodium: 136 mmol/L (ref 135–146)
Total Bilirubin: 0.6 mg/dL (ref 0.2–1.2)
Total Protein: 7.3 g/dL (ref 6.1–8.1)

## 2020-06-19 LAB — HEMOGLOBIN A1C
Hgb A1c MFr Bld: 5.9 % of total Hgb — ABNORMAL HIGH (ref <5.7)
Mean Plasma Glucose: 123 (calc)
eAG (mmol/L): 6.8 (calc)

## 2020-06-22 ENCOUNTER — Encounter: Payer: Self-pay | Admitting: Family Medicine

## 2020-06-24 ENCOUNTER — Ambulatory Visit: Payer: 59 | Admitting: Orthopedic Surgery

## 2020-06-24 ENCOUNTER — Other Ambulatory Visit: Payer: Self-pay | Admitting: Family Medicine

## 2020-06-24 MED ORDER — ATORVASTATIN CALCIUM 40 MG PO TABS
40.0000 mg | ORAL_TABLET | Freq: Every day | ORAL | 2 refills | Status: DC
Start: 2020-06-24 — End: 2021-03-10

## 2020-07-30 ENCOUNTER — Encounter: Payer: Self-pay | Admitting: Family Medicine

## 2020-08-14 ENCOUNTER — Other Ambulatory Visit: Payer: Self-pay

## 2020-08-14 DIAGNOSIS — F411 Generalized anxiety disorder: Secondary | ICD-10-CM

## 2020-08-14 MED ORDER — FLUOXETINE HCL 20 MG PO CAPS: 20.0000 mg | ORAL_CAPSULE | Freq: Every day | ORAL | 3 refills | Status: DC

## 2020-08-17 ENCOUNTER — Other Ambulatory Visit: Payer: Self-pay | Admitting: Family Medicine

## 2020-08-17 DIAGNOSIS — F411 Generalized anxiety disorder: Secondary | ICD-10-CM

## 2020-08-19 ENCOUNTER — Encounter: Payer: Self-pay | Admitting: Family Medicine

## 2020-08-23 ENCOUNTER — Other Ambulatory Visit: Payer: Self-pay

## 2020-08-23 DIAGNOSIS — F411 Generalized anxiety disorder: Secondary | ICD-10-CM

## 2020-08-23 MED ORDER — FLUOXETINE HCL 10 MG PO CAPS: 10.0000 mg | ORAL_CAPSULE | Freq: Every day | ORAL | 1 refills | Status: DC

## 2020-10-14 ENCOUNTER — Encounter: Payer: Self-pay | Admitting: Family Medicine

## 2020-10-14 DIAGNOSIS — F411 Generalized anxiety disorder: Secondary | ICD-10-CM

## 2020-10-14 MED ORDER — FLUOXETINE HCL 10 MG PO CAPS: 10.0000 mg | ORAL_CAPSULE | Freq: Every day | ORAL | 1 refills | Status: DC

## 2021-01-18 ENCOUNTER — Other Ambulatory Visit: Payer: Self-pay | Admitting: Family Medicine

## 2021-03-10 ENCOUNTER — Other Ambulatory Visit: Payer: Self-pay | Admitting: Family Medicine

## 2021-03-10 ENCOUNTER — Telehealth: Payer: 59 | Admitting: Physician Assistant

## 2021-03-10 DIAGNOSIS — J208 Acute bronchitis due to other specified organisms: Secondary | ICD-10-CM

## 2021-03-10 DIAGNOSIS — B9689 Other specified bacterial agents as the cause of diseases classified elsewhere: Secondary | ICD-10-CM | POA: Diagnosis not present

## 2021-03-10 MED ORDER — BENZONATATE 100 MG PO CAPS: 100.0000 mg | ORAL_CAPSULE | Freq: Three times a day (TID) | ORAL | 0 refills | Status: DC | PRN

## 2021-03-10 MED ORDER — AZITHROMYCIN 250 MG PO TABS: ORAL_TABLET | ORAL | 0 refills | Status: DC

## 2021-03-10 NOTE — Progress Notes (Signed)

## 2021-04-17 ENCOUNTER — Other Ambulatory Visit: Payer: Self-pay | Admitting: Family Medicine

## 2021-04-17 DIAGNOSIS — F411 Generalized anxiety disorder: Secondary | ICD-10-CM

## 2021-05-26 ENCOUNTER — Telehealth: Payer: Self-pay | Admitting: Internal Medicine

## 2021-05-26 NOTE — Telephone Encounter (Signed)
Hey Dr. Hilarie Fredrickson,   We received a transfer of care for patient for colonoscopy. Patient had last procedure in 2016. She would like to be your patient. I have records and will send for review. Could you please review and advise on scheduling?  Thank you

## 2021-05-26 NOTE — Telephone Encounter (Signed)
Colonoscopy in 2016 Polyp removed from hepatic flexure by snare cautery; thermal impact made the polyp unable to be fully assessed by pathology --thus unknown if this was sessile serrated, adenomatous, hyperplastic or benign polypoid mucosa Dr. Watt Climes recommended 5-year recall colonoscopy; which given the lack of definitive pathology is reasonable  Okay to repeat colonoscopy at this time

## 2021-06-02 NOTE — Telephone Encounter (Signed)
Spoke with patient. She states she istraveling at the time and will call back to get colonoscopy scheduled with Dr. Hilarie Fredrickson

## 2021-06-06 ENCOUNTER — Encounter: Payer: Self-pay | Admitting: Internal Medicine

## 2021-06-09 ENCOUNTER — Encounter: Payer: Self-pay | Admitting: Family Medicine

## 2021-06-09 ENCOUNTER — Ambulatory Visit (INDEPENDENT_AMBULATORY_CARE_PROVIDER_SITE_OTHER): Payer: Medicare Other | Admitting: Family Medicine

## 2021-06-09 ENCOUNTER — Other Ambulatory Visit: Payer: Self-pay

## 2021-06-09 VITALS — BP 128/80 | HR 75 | Resp 16 | Ht 63.25 in | Wt 170.4 lb

## 2021-06-09 DIAGNOSIS — E782 Mixed hyperlipidemia: Secondary | ICD-10-CM

## 2021-06-09 DIAGNOSIS — M2550 Pain in unspecified joint: Secondary | ICD-10-CM

## 2021-06-09 DIAGNOSIS — R7303 Prediabetes: Secondary | ICD-10-CM | POA: Insufficient documentation

## 2021-06-09 DIAGNOSIS — R194 Change in bowel habit: Secondary | ICD-10-CM | POA: Diagnosis not present

## 2021-06-09 DIAGNOSIS — R0789 Other chest pain: Secondary | ICD-10-CM | POA: Diagnosis not present

## 2021-06-09 DIAGNOSIS — R5383 Other fatigue: Secondary | ICD-10-CM

## 2021-06-09 DIAGNOSIS — K219 Gastro-esophageal reflux disease without esophagitis: Secondary | ICD-10-CM

## 2021-06-09 LAB — HEPATIC FUNCTION PANEL
ALT: 33 U/L (ref 0–35)
AST: 30 U/L (ref 0–37)
Albumin: 4.7 g/dL (ref 3.5–5.2)
Alkaline Phosphatase: 84 U/L (ref 39–117)
Bilirubin, Direct: 0.1 mg/dL (ref 0.0–0.3)
Total Bilirubin: 0.6 mg/dL (ref 0.2–1.2)
Total Protein: 7.9 g/dL (ref 6.0–8.3)

## 2021-06-09 LAB — HEMOGLOBIN A1C: Hgb A1c MFr Bld: 6.1 % (ref 4.6–6.5)

## 2021-06-09 LAB — TSH: TSH: 1.55 u[IU]/mL (ref 0.35–5.50)

## 2021-06-09 LAB — CBC
HCT: 42.3 % (ref 36.0–46.0)
Hemoglobin: 14.1 g/dL (ref 12.0–15.0)
MCHC: 33.3 g/dL (ref 30.0–36.0)
MCV: 87.3 fl (ref 78.0–100.0)
Platelets: 318 10*3/uL (ref 150.0–400.0)
RBC: 4.84 Mil/uL (ref 3.87–5.11)
RDW: 13.2 % (ref 11.5–15.5)
WBC: 5.5 10*3/uL (ref 4.0–10.5)

## 2021-06-09 LAB — LIPID PANEL
Cholesterol: 230 mg/dL — ABNORMAL HIGH (ref 0–200)
HDL: 59.3 mg/dL (ref >39.00)
NonHDL: 170.4
Total CHOL/HDL Ratio: 4
Triglycerides: 330 mg/dL — ABNORMAL HIGH (ref 0.0–149.0)
VLDL: 66 mg/dL — ABNORMAL HIGH (ref 0.0–40.0)

## 2021-06-09 LAB — BASIC METABOLIC PANEL
BUN: 19 mg/dL (ref 6–23)
CO2: 27 mEq/L (ref 19–32)
Calcium: 10.6 mg/dL — ABNORMAL HIGH (ref 8.4–10.5)
Chloride: 99 mEq/L (ref 96–112)
Creatinine, Ser: 0.87 mg/dL (ref 0.40–1.20)
GFR: 70.06 mL/min (ref >60.00)
Glucose, Bld: 79 mg/dL (ref 70–99)
Potassium: 3.7 mEq/L (ref 3.5–5.1)
Sodium: 137 mEq/L (ref 135–145)

## 2021-06-09 LAB — LDL CHOLESTEROL, DIRECT: Direct LDL: 134 mg/dL

## 2021-06-09 MED ORDER — OMEPRAZOLE 40 MG PO CPDR: 40.0000 mg | DELAYED_RELEASE_CAPSULE | Freq: Every day | ORAL | 0 refills | Status: DC

## 2021-06-09 NOTE — Assessment & Plan Note (Addendum)
Continue Atorvastatin 40 mg and low fat diet. Further recommendation will be given according to lipid panel result.

## 2021-06-09 NOTE — Progress Notes (Signed)
ACUTE VISIT Chief Complaint  Patient presents with   Fatigue        stomach issues   Joint Pain   HPI: Ms.Shatonya Huguley is a 65 y.o. female with hx of HLD,insomnia,anxiety,and migraine headache here today complaining of joint pain, extreme fatigue, and stomach issues.  Fatigue: Problem has been going on intermittently for over a year. Started exercising 3 weeks ago, participated in a group exercise with moderate intensity, after the 3rd day she felt "so weak", she felt exhausted this day.She has "not felt right since then." Sleeping 6-7 hours. No known OSA. Feels rested in the morning when she gets up.  Lab Results  Component Value Date   WBC 5.1 02/27/2019   HGB 14.3 02/27/2019   HCT 43.2 02/27/2019   MCV 89.3 02/27/2019   PLT 315.0 02/27/2019   Lab Results  Component Value Date   TSH 0.94 02/27/2019   Intermittent "weird feeling" on chest since exercise class, 10 days ago.  "Heavy" sensation in mid upper chest, not radiated. She has not identified exacerbating or alleviating factors, it happens at rest. Negative for orthopnea or PND.  Yesterday after walking her dog she had dull pain in left arm that lasted a few minutes. "Little SOB" with exertion. No associated palpitations or diaphoresis. Acid reflux, she is on Omeprazole 20 mg daily. Negative for heartburn, nausea, vomiting, or melena.  Constipation,small stools x 2 d and other days she is having diarrhea.This is a new problem. Last colonoscopy on 01/31/2015. She is planning on seeing her GI.  Prediabetes: She has not noted polydipsia, polyuria, polyphagia. Lab Results  Component Value Date   HGBA1C 5.9 (H) 06/18/2020   Arthritis: Right ankle and knee pain. Probably has been going on for a while. Negative for erythema and edema. She has seen podiatrist. Exacerbated by prolonged walking. No recent trauma.  She would like cholesterol checked. Currently she is on atorvastatin 40 mg daily.  Lab Results   Component Value Date   CHOL 265 (H) 06/18/2020   HDL 59 06/18/2020   LDLCALC 154 (H) 06/18/2020   LDLDIRECT 150.0 02/27/2019   TRIG 335 (H) 06/18/2020   CHOLHDL 4.5 06/18/2020   Review of Systems  Constitutional:  Negative for chills and fever.  HENT:  Negative for sore throat and trouble swallowing.   Respiratory:  Negative for cough and wheezing.   Gastrointestinal:  Negative for abdominal pain and blood in stool.  Endocrine: Negative for cold intolerance and heat intolerance.  Genitourinary:  Negative for decreased urine volume, dysuria and hematuria.  Musculoskeletal:  Negative for gait problem.  Skin:  Negative for pallor and rash.  Neurological:  Negative for syncope, facial asymmetry and weakness.  Hematological:  Negative for adenopathy. Does not bruise/bleed easily.  Rest see pertinent positives and negatives per HPI.  Current Outpatient Medications on File Prior to Visit  Medication Sig Dispense Refill   ALPRAZolam (XANAX) 0.25 MG tablet Take 0.5-1 tablets (0.125-0.25 mg total) by mouth at bedtime as needed for anxiety. 30 tablet 1   Ascorbic Acid (VITAMIN C) 100 MG tablet Take 100 mg by mouth daily.     atorvastatin (LIPITOR) 40 MG tablet TAKE 1 TABLET BY MOUTH EVERY DAY 90 tablet 1   B Complex-C (SUPER B COMPLEX/VITAMIN C PO) Take by mouth daily.     Biotin 1 MG CAPS Take by mouth daily.     cetirizine (ZYRTEC) 10 MG chewable tablet Chew 10 mg by mouth daily as needed for allergies.  cholecalciferol (VITAMIN D) 400 UNITS TABS tablet Take 1 tablet (400 Units total) by mouth daily. 90 tablet 1   FLUoxetine (PROZAC) 20 MG capsule Take 1 capsule (20 mg total) by mouth daily. 90 capsule 3   Nutritional Supplements (JUICE PLUS FIBRE) LIQD Take by mouth daily.     SUMAtriptan (IMITREX) 100 MG tablet TAKE 1 TABLET BY MOUTH AS NEEDED FOR HEADACHE. MAY REPEAT IN 2 HOURS IF HEADACHE PERSISTS OR RECURS 10 tablet 3   fluticasone (FLONASE) 50 MCG/ACT nasal spray PLACE 1 SPRAY INTO  BOTH NOSTRILS 2 (TWO) TIMES DAILY. 16 g 1   No current facility-administered medications on file prior to visit.   Past Medical History:  Diagnosis Date   Basal cell adenocarcinoma    Elevated cholesterol    GERD without esophagitis    Heartburn    History of chicken pox    Insomnia    Migraine headache    Osteoarthritis    Lumbar spine; hands; feet   UTI (lower urinary tract infection)    Allergies  Allergen Reactions   Other    Social History   Socioeconomic History   Marital status: Married    Spouse name: Not on file   Number of children: Not on file   Years of education: Not on file   Highest education level: Not on file  Occupational History   Not on file  Tobacco Use   Smoking status: Never   Smokeless tobacco: Never  Substance and Sexual Activity   Alcohol use: Yes    Alcohol/week: 2.0 standard drinks    Types: 2 Standard drinks or equivalent per week   Drug use: No   Sexual activity: Yes    Partners: Male  Other Topics Concern   Not on file  Social History Narrative   [place].   Social Determinants of Health   Financial Resource Strain: Not on file  Food Insecurity: Not on file  Transportation Needs: Not on file  Physical Activity: Not on file  Stress: Not on file  Social Connections: Not on file   Vitals:   06/09/21 1046  BP: 128/80  Pulse: 75  Resp: 16  SpO2: 97%   Body mass index is 29.94 kg/m.  Physical Exam Vitals and nursing note reviewed.  Constitutional:      General: She is not in acute distress.    Appearance: She is well-developed.  HENT:     Head: Normocephalic and atraumatic.     Mouth/Throat:     Mouth: Mucous membranes are moist.     Pharynx: Oropharynx is clear.  Eyes:     Conjunctiva/sclera: Conjunctivae normal.  Cardiovascular:     Rate and Rhythm: Normal rate and regular rhythm.     Pulses:          Dorsalis pedis pulses are 2+ on the right side and 2+ on the left side.     Heart sounds: No murmur  heard. Pulmonary:     Effort: Pulmonary effort is normal. No respiratory distress.     Breath sounds: Normal breath sounds.  Abdominal:     Palpations: Abdomen is soft. There is no hepatomegaly or mass.     Tenderness: There is no abdominal tenderness.  Lymphadenopathy:     Cervical: No cervical adenopathy.  Skin:    General: Skin is warm.     Findings: No erythema or rash.  Neurological:     General: No focal deficit present.     Mental Status: She  is alert and oriented to person, place, and time.     Cranial Nerves: No cranial nerve deficit.     Gait: Gait normal.  Psychiatric:     Comments: Well groomed, good eye contact.   ASSESSMENT AND PLAN:  Ms.Camber was seen today for fatigue, stomach issues and joint pain.  Diagnoses and all orders for this visit: Orders Placed This Encounter  Procedures   TSH   Basic metabolic panel   CBC   Hemoglobin A1c   Hepatic function panel   Lipid panel   LDL cholesterol, direct   Ambulatory referral to Cardiology   EKG 12-Lead   Lab Results  Component Value Date   CHOL 230 (H) 06/09/2021   HDL 59.30 06/09/2021   LDLCALC 154 (H) 06/18/2020   LDLDIRECT 134.0 06/09/2021   TRIG 330.0 (H) 06/09/2021   CHOLHDL 4 06/09/2021   Lab Results  Component Value Date   ALT 33 06/09/2021   AST 30 06/09/2021   ALKPHOS 84 06/09/2021   BILITOT 0.6 06/09/2021   Lab Results  Component Value Date   HGBA1C 6.1 06/09/2021   Lab Results  Component Value Date   WBC 5.5 06/09/2021   HGB 14.1 06/09/2021   HCT 42.3 06/09/2021   MCV 87.3 06/09/2021   PLT 318.0 06/09/2021   Lab Results  Component Value Date   TSH 1.55 06/09/2021   Lab Results  Component Value Date   CREATININE 0.87 06/09/2021   BUN 19 06/09/2021   NA 137 06/09/2021   K 3.7 06/09/2021   CL 99 06/09/2021   CO2 27 06/09/2021   Chest discomfort Reviewed possible etiologies. GERD could be a contributing factor. EKG done today:SR,normal axis,normal intervals. No signs of  ischemia..  No significant changes when compared with EKG done in 01/2018. Recommend holding on regular exercise until she sees cardiologist, she would like to see Dr Burt Knack. Clearly instructed about warning signs.  Other fatigue We discussed possible causes. Some of her chronic medical problems could be contributing factors. Further recommendation will be given according to lab results.  Prediabetes A healthy life style encouraged for diabetes prevention. Further recommendations according to HgA1C result.  Gastroesophageal reflux disease without esophagitis This problem could be contributing to her chest discomfort. Recommend increasing dose of omeprazole from 20 mg to 40 mg. GERD precautions to continue. Continue following with GI.  Hyperlipidemia Continue Atorvastatin 40 mg and low fat diet. Further recommendation will be given according to lipid panel result.  Bowel habit changes Alternating between constipation and diarrhea. Continue adequate hydration and fiber intake. Follow-up with GI.  Arthralgia, unspecified joint Right ankle and knees. Most likely OA. Recommend continue following with podiatrist for ankle pain.  I spent a total of 45 minutes in both face to face and non face to face activities for this visit on the date of this encounter. During this time history was obtained and documented, examination was performed, prior labs reviewed, and assessment/plan discussed.  Return if symptoms worsen or fail to improve.   Cane Dubray G. [place], MD  [facility]. [place] office.

## 2021-06-09 NOTE — Assessment & Plan Note (Signed)
A healthy life style encouraged for diabetes prevention. Further recommendations according to HgA1C result. 

## 2021-06-09 NOTE — Patient Instructions (Addendum)
A few things to remember from today's visit:  Prediabetes - Plan: Hemoglobin A1c  Other fatigue - Plan: TSH, Basic metabolic panel, CBC  Chest discomfort - Plan: EKG 12-Lead, Ambulatory referral to Cardiology  Bowel habit changes  Gastroesophageal reflux disease without esophagitis  If you need refills please call your pharmacy. Do not use My Chart to request refills or for acute issues that need immediate attention.   Appt with cardiologist will be arranged. Keep appt with gastro.  Fatigue is a common symptom associated with multiple factors: psychologic,medications, systemic illness, sleep disorders,infections, and unknown causes. Some work-up can be done to evaluate for common causes as thyroid disease,anemia,diabetes, or abnormalities in calcium,potassium,or sodium. Regular physical activity as tolerated and a healthy diet is usually might help and usually recommended for chronic fatigue.  For now hold on regular exercise until you see cardiologist. Continue Omeprazole daily.  Please be sure medication list is accurate. If a new problem present, please set up appointment sooner than planned today.

## 2021-06-09 NOTE — Assessment & Plan Note (Addendum)
This problem could be contributing to her chest discomfort. Recommend increasing dose of omeprazole from 20 mg to 40 mg. GERD precautions to continue. Continue following with GI.

## 2021-06-20 ENCOUNTER — Other Ambulatory Visit: Payer: Self-pay

## 2021-06-20 ENCOUNTER — Ambulatory Visit (INDEPENDENT_AMBULATORY_CARE_PROVIDER_SITE_OTHER): Payer: Medicare Other | Admitting: Internal Medicine

## 2021-06-20 ENCOUNTER — Encounter: Payer: Self-pay | Admitting: Internal Medicine

## 2021-06-20 ENCOUNTER — Encounter: Payer: 59 | Admitting: Family Medicine

## 2021-06-20 VITALS — BP 120/86 | HR 78 | Ht 63.25 in | Wt 169.4 lb

## 2021-06-20 DIAGNOSIS — R072 Precordial pain: Secondary | ICD-10-CM | POA: Diagnosis not present

## 2021-06-20 DIAGNOSIS — R7303 Prediabetes: Secondary | ICD-10-CM | POA: Diagnosis not present

## 2021-06-20 DIAGNOSIS — E782 Mixed hyperlipidemia: Secondary | ICD-10-CM

## 2021-06-20 DIAGNOSIS — R079 Chest pain, unspecified: Secondary | ICD-10-CM | POA: Diagnosis not present

## 2021-06-20 MED ORDER — ASPIRIN EC 81 MG PO TBEC: 81.0000 mg | DELAYED_RELEASE_TABLET | Freq: Every day | ORAL | 3 refills | Status: DC

## 2021-06-20 MED ORDER — NITROGLYCERIN 0.4 MG SL SUBL: 0.4000 mg | SUBLINGUAL_TABLET | SUBLINGUAL | 3 refills | Status: DC | PRN

## 2021-06-20 MED ORDER — METOPROLOL TARTRATE 100 MG PO TABS: 100.0000 mg | ORAL_TABLET | Freq: Once | ORAL | 0 refills | Status: DC

## 2021-06-20 NOTE — Patient Instructions (Addendum)
Medication Instructions:  Your physician has recommended you make the following change in your medication:  1.) aspirin - 81 mg - enteric coated - take one tablet daily  2.) nitroglycerin 0.4 mg - place one tablet under your tongue for chest pain/discomfort, -may repeat in 5 min if chest pain/discomfort remains, may repeat once more after 5 min if symptoms remain (3 doses total).  Please call EMS [phone]) as soon as you take the third dose.  *If you need a refill on your cardiac medications before your next appointment, please call your pharmacy*   Lab Work: none  Testing/Procedures: Cardiac CT - see instructions below  Your physician has requested that you have an echocardiogram. Echocardiography is a painless test that uses sound waves to create images of your heart. It provides your doctor with information about the size and shape of your heart and how well your heart's chambers and valves are working. This procedure takes approximately one hour. There are no restrictions for this procedure.   Follow-Up: At [facility], you and your health needs are our priority.  As part of our continuing mission to provide you with exceptional heart care, we have created designated Provider Care Teams.  These Care Teams include your primary Cardiologist (physician) and Advanced Practice Providers (APPs -  Physician Assistants and Nurse Practitioners) who all work together to provide you with the care you need, when you need it.   Your next appointment:   3 month(s)  The format for your next appointment:   In Person  Provider:   Early Osmond, MD     Other Instructions   Your cardiac CT will be scheduled at the below location:   [facility] [address] [place], [place] [address] [phone]  Please arrive at the [facility] main entrance (entrance A) of [facility] 30 minutes prior to test start time. You can use the FREE valet parking offered at the main  entrance (encouraged to control the heart rate for the test) Proceed to the [facility] Radiology Department (first floor) to check-in and test prep.   Please follow these instructions carefully (unless otherwise directed):   On the Night Before the Test: Be sure to Drink plenty of water. Do not consume any caffeinated/decaffeinated beverages or chocolate 12 hours prior to your test. Do not take any antihistamines 12 hours prior to your test. (Zyrtec)  On the Day of the Test: Drink plenty of water until 1 hour prior to the test. Do not eat any food 4 hours prior to the test. You may take your regular medications prior to the test.  Take metoprolol (Lopressor) two hours prior to test. FEMALES- please wear underwire-free bra if available, avoid dresses & tight clothing      After the Test: Drink plenty of water. After receiving IV contrast, you may experience a mild flushed feeling. This is normal. On occasion, you may experience a mild rash up to 24 hours after the test. This is not dangerous. If this occurs, you can take Benadryl 25 mg and increase your fluid intake. If you experience trouble breathing, this can be serious. If it is severe call 911 IMMEDIATELY. If it is mild, please call our office. If you take any of these medications: Glipizide/Metformin, Avandament, Glucavance, please do not take 48 hours after completing test unless otherwise instructed.  Please allow 2-4 weeks for scheduling of routine cardiac CTs. Some insurance companies require a pre-authorization which may delay scheduling of  this test.   For non-scheduling related questions, please contact the cardiac imaging nurse navigator should you have any questions/concerns: Marchia Bond, Cardiac Imaging Nurse Navigator Gordy Clement, Cardiac Imaging Nurse Navigator [facility] Direct Office Dial: [phone]   For scheduling needs, including cancellations and rescheduling, please call  [place], [phone].

## 2021-06-20 NOTE — Progress Notes (Signed)
Cardiology Office Note:    Date:  06/20/2021   ID:  Deloise Marchant, DOB 06/28/1956, MRN [id number]  PCP:  [place], Betty G, MD   [facility] Cardiologist:  Early Osmond, MD     Referring MD: [place]ique, Betty G, MD   Chief Complaint:   Chest pain  History of Present Illness:     PROBLEM LIST: 1.  Hyperlipidemia 2.  GERD 3.  Prediabetes controlled by diet   Kathryn Cummings is a 65 y.o. female with the indicated medical problems here for recommendations regarding chest discomfort.  This happened to her she had started an exercise program.  She was recently seen by her primary care provider with complaints of occasional chest pain.  An EKG done in her office demonstrated no acute ischemic changes.  The patient reports that she has had some exertional chest fullness at times.  She rates it at about a 2 out of 10.  It will stop with rest but it takes about 30 minutes for it to go away.  She denies any exertional dyspnea but when she rests she will have to side to catch her breath.  It does not seem to be related to meals.  She does have severe GERD and had her PPI dose increased recently just to see if this would help her symptoms.  In some ways it has but this chest fullness has not been alleviated or been improved.  Her GERD symptoms which are different have been improved.  She denies any presyncope, syncope, palpitations, paroxysmal nocturnal dyspnea, orthopnea.  She is required no emergency room visits or hospitalizations.       Previous Medical/Surgical History:    Past Medical History:  Diagnosis Date   Basal cell adenocarcinoma    Elevated cholesterol    GERD without esophagitis    Heartburn    History of chicken pox    Insomnia    Migraine headache    Osteoarthritis    Lumbar spine; hands; feet   UTI (lower urinary tract infection)     Past Surgical History:  Procedure Laterality Date   ABDOMINAL HYSTERECTOMY  1988   APPENDECTOMY  1988   BASAL CELL CARCINOMA  EXCISION     Multiple   BLADDER REPAIR  2000   Mesh   BREAST BIOPSY  1990   CHOLECYSTECTOMY      Current Medications: Current Meds  Medication Sig   ALPRAZolam (XANAX) 0.25 MG tablet Take 0.5-1 tablets (0.125-0.25 mg total) by mouth at bedtime as needed for anxiety.   aspirin EC 81 MG tablet Take 1 tablet (81 mg total) by mouth daily. Swallow whole.   atorvastatin (LIPITOR) 40 MG tablet TAKE 1 TABLET BY MOUTH EVERY DAY   cetirizine (ZYRTEC) 10 MG chewable tablet Chew 10 mg by mouth daily as needed for allergies.   FLUoxetine (PROZAC) 10 MG capsule Take 10 mg by mouth daily.   FLUoxetine (PROZAC) 20 MG capsule Take 1 capsule (20 mg total) by mouth daily.   nitroGLYCERIN (NITROSTAT) 0.4 MG SL tablet Place 1 tablet (0.4 mg total) under the tongue every 5 (five) minutes as needed for chest pain.   Nutritional Supplements (JUICE PLUS FIBRE) LIQD Take by mouth daily.   omeprazole (PRILOSEC) 40 MG capsule Take 1 capsule (40 mg total) by mouth daily.   SUMAtriptan (IMITREX) 100 MG tablet TAKE 1 TABLET BY MOUTH AS NEEDED FOR HEADACHE. MAY REPEAT IN 2 HOURS IF HEADACHE PERSISTS OR RECURS     Allergies:  Other   Social History:    Social History   Socioeconomic History   Marital status: Married    Spouse name: Not on file   Number of children: Not on file   Years of education: Not on file   Highest education level: Not on file  Occupational History   Not on file  Tobacco Use   Smoking status: Never   Smokeless tobacco: Never  Substance and Sexual Activity   Alcohol use: Yes    Alcohol/week: 2.0 standard drinks    Types: 2 Standard drinks or equivalent per week   Drug use: No   Sexual activity: Yes    Partners: Male  Other Topics Concern   Not on file  Social History Narrative   [place].   Social Determinants of Health   Financial Resource Strain: Not on file  Food Insecurity: Not on file  Transportation Needs: Not on file  Physical Activity: Not on file   Stress: Not on file  Social Connections: Not on file     Family History:   Family History  Problem Relation Age of Onset   Arthritis-Osteo Mother        Living   Diabetes Mother    Hyperlipidemia Mother    Hypertension Mother    Arthritis-Osteo Father        Living   Diabetes Father    Hypertension Father    Heart attack Maternal Grandfather    Heart attack Maternal Grandmother    Alzheimer's disease Maternal Grandmother    Pancreatic cancer Paternal Grandfather    Dementia Paternal Grandmother    Breast cancer Maternal Aunt    Healthy Brother        x1   Healthy Sister        x1   Healthy Son        x2     ROS:  Please see the history of present illness.  All other systems reviewed and are negative.  EKGs/Labs/Other Studies Reviewed:     EKG:   The ekg ordered today demonstrates normal sinus rhythm with no Q waves or ST changes  Recent Labs: 06/09/2021: ALT 33; BUN 19; Creatinine, Ser 0.87; Hemoglobin 14.1; Platelets 318.0; Potassium 3.7; Sodium 137; TSH 1.55   Recent Lipid Panel    Component Value Date/Time   CHOL 230 (H) 06/09/2021 1200   TRIG 330.0 (H) 06/09/2021 1200   HDL 59.30 06/09/2021 1200   CHOLHDL 4 06/09/2021 1200   VLDL 66.0 (H) 06/09/2021 1200   LDLCALC 154 (H) 06/18/2020 1042   LDLDIRECT 134.0 06/09/2021 1200          Physical Exam:    VS:  BP 120/86   Pulse 78   Ht 5' 3.25" (1.607 m)   Wt 169 lb 6.4 oz (76.8 kg)   SpO2 100%   BMI 29.77 kg/m     Wt Readings from Last 3 Encounters:  06/20/21 169 lb 6.4 oz (76.8 kg)  06/09/21 170 lb 6 oz (77.3 kg)  06/18/20 168 lb 6 oz (76.4 kg)     GEN:  Well nourished, well developed in no acute distress HEENT: Normal NECK: No JVD; No carotid bruits LYMPHATICS: No lymphadenopathy CARDIAC: RRR, no murmurs, rubs, gallops RESPIRATORY:  Clear to auscultation without rales, wheezing or rhonchi  ABDOMEN: Soft, non-tender, non-distended MUSCULOSKELETAL:  No edema; No deformity  SKIN: Warm and  dry NEUROLOGIC:  Alert and oriented x 3 PSYCHIATRIC:  Normal affect   ASSESSMENT and PLAN  Chest pain of uncertain etiology The patient has developed chest fullness with some atypical and typical characteristics.  I will obtain an echocardiogram and CTA to evaluate further.  We will prescribe as needed nitroglycerin.  I will see her back in 3 months or earlier if needed.  Certainly if her CTA demonstrates high risk findings we may proceed to cardiac catheterization.  If it shows moderate disease we will treat this medically.  If as needed nitroglycerin helps and she is only mild to moderate coronary artery disease this may suggest an esophageal spasm issue.  She would require GI evaluation.  Mixed hyperlipidemia The patient is on atorvastatin and this is being followed by her primary care provider.  Prediabetes She is on a statin.  An ACE and ARB can be considered.  We will start aspirin 81 mg.             Medication Adjustments/Labs and Tests Ordered: Current medicines are reviewed at length with the patient today.  Concerns regarding medicines are outlined above.  Orders Placed This Encounter  Procedures   EKG 12-Lead   ECHOCARDIOGRAM COMPLETE    Meds ordered this encounter  Medications   nitroGLYCERIN (NITROSTAT) 0.4 MG SL tablet    Sig: Place 1 tablet (0.4 mg total) under the tongue every 5 (five) minutes as needed for chest pain.    Dispense:  25 tablet    Refill:  3   aspirin EC 81 MG tablet    Sig: Take 1 tablet (81 mg total) by mouth daily. Swallow whole.    Dispense:  90 tablet    Refill:  3     Patient Instructions  Medication Instructions:  Your physician has recommended you make the following change in your medication:  1.) nitroglycerin 0.4 mg - place one tablet under your tongue for chest pain/discomfort, -may repeat in 5 min if chest pain/discomfort remains, may repeat once more after 5 min if symptoms remain (3 doses total).  Please call EMS [phone]) as  soon as you take the third dose.  *If you need a refill on your cardiac medications before your next appointment, please call your pharmacy*   Lab Work: none If you have labs (blood work) drawn today and your tests are completely normal, you will receive your results only by: [place] (if you have MyChart) OR A paper copy in the mail If you have any lab test that is abnormal or we need to change your treatment, we will call you to review the results.   Testing/Procedures: Cardiac CT  Your physician has requested that you have an echocardiogram. Echocardiography is a painless test that uses sound waves to create images of your heart. It provides your doctor with information about the size and shape of your heart and how well your heart's chambers and valves are working. This procedure takes approximately one hour. There are no restrictions for this procedure.    Follow-Up: At [facility], you and your health needs are our priority.  As part of our continuing mission to provide you with exceptional heart care, we have created designated Provider Care Teams.  These Care Teams include your primary Cardiologist (physician) and Advanced Practice Providers (APPs -  Physician Assistants and Nurse Practitioners) who all work together to provide you with the care you need, when you need it.    Your next appointment:   3 month(s)  The format for your next appointment:   In Person  Provider:   Arlie Solomons  Ali Lowe, MD     Other Instructions   Your cardiac CT will be scheduled at one of the below locations:   [facility] [address] [place], [place] [address] [phone]  [facility] [address] [place], [place] [address] [phone]  If scheduled at [facility], please arrive at the [facility] main entrance (entrance A) of [facility] 30 minutes prior to test start time. You can use  the FREE valet parking offered at the main entrance (encouraged to control the heart rate for the test) Proceed to the [facility] Radiology Department (first floor) to check-in and test prep.  If scheduled at [facility] please arrive 15 mins early for check-in and test prep.  Please follow these instructions carefully (unless otherwise directed):  Hold all erectile dysfunction medications at least 3 days (72 hrs) prior to test.  On the Night Before the Test: Be sure to Drink plenty of water. Do not consume any caffeinated/decaffeinated beverages or chocolate 12 hours prior to your test. Do not take any antihistamines 12 hours prior to your test. If the patient has contrast allergy: Patient will need a prescription for Prednisone and very clear instructions (as follows): Prednisone 50 mg - take 13 hours prior to test Take another Prednisone 50 mg 7 hours prior to test Take another Prednisone 50 mg 1 hour prior to test Take Benadryl 50 mg 1 hour prior to test Patient must complete all four doses of above prophylactic medications. Patient will need a ride after test due to Benadryl.  On the Day of the Test: Drink plenty of water until 1 hour prior to the test. Do not eat any food 4 hours prior to the test. You may take your regular medications prior to the test.  Take metoprolol (Lopressor) two hours prior to test. HOLD Furosemide/Hydrochlorothiazide morning of the test. FEMALES- please wear underwire-free bra if available, avoid dresses & tight clothing   *For Clinical Staff only. Please instruct patient the following:* Heart Rate Medication Recommendations for Cardiac CT  Resting HR < 50 bpm  No medication  Resting HR 50-60 bpm and BP >110/50 mmHG   Consider Metoprolol tartrate 25 mg PO 90-120 min prior to scan  Resting HR 60-65 bpm and BP >110/50 mmHG  Metoprolol tartrate 50 mg PO 90-120 minutes prior to scan   Resting HR > 65 bpm and BP >110/50 mmHG   Metoprolol tartrate 100 mg PO 90-120 minutes prior to scan  Consider Ivabradine 10-15 mg PO or a calcium channel blocker for resting HR >60 bpm and contraindication to metoprolol tartrate  Consider Ivabradine 10-15 mg PO in combination with metoprolol tartrate for HR >80 bpm         After the Test: Drink plenty of water. After receiving IV contrast, you may experience a mild flushed feeling. This is normal. On occasion, you may experience a mild rash up to 24 hours after the test. This is not dangerous. If this occurs, you can take Benadryl 25 mg and increase your fluid intake. If you experience trouble breathing, this can be serious. If it is severe call 911 IMMEDIATELY. If it is mild, please call our office. If you take any of these medications: Glipizide/Metformin, Avandament, Glucavance, please do not take 48 hours after completing test unless otherwise instructed.  Please allow 2-4 weeks for scheduling of routine cardiac CTs. Some insurance companies require a pre-authorization which may delay scheduling  of this test.   For non-scheduling related questions, please contact the cardiac imaging nurse navigator should you have any questions/concerns: Marchia Bond, Cardiac Imaging Nurse Navigator Gordy Clement, Cardiac Imaging Nurse Navigator [facility] Direct Office Dial: [phone]   For scheduling needs, including cancellations and rescheduling, please call [place], [phone].     Signed, Early Osmond, MD  06/20/2021 3:16 PM    [place]

## 2021-06-20 NOTE — Assessment & Plan Note (Signed)
The patient is on atorvastatin and this is being followed by her primary care provider.

## 2021-06-20 NOTE — Assessment & Plan Note (Addendum)
The patient has developed chest fullness with some atypical and typical characteristics.  I will obtain an echocardiogram and CTA to evaluate further.  We will prescribe as needed nitroglycerin.  I will see her back in 3 months or earlier if needed.  Certainly if her CTA demonstrates high risk findings we may proceed to cardiac catheterization.  If it shows moderate disease we will treat this medically.  If as needed nitroglycerin helps and she is only mild to moderate coronary artery disease this may suggest an esophageal spasm issue.  She would require GI evaluation.

## 2021-06-20 NOTE — Assessment & Plan Note (Signed)
She is on a statin.  An ACE and ARB can be considered.  We will start aspirin 81 mg.

## 2021-06-23 ENCOUNTER — Encounter: Payer: Self-pay | Admitting: *Deleted

## 2021-06-23 NOTE — Telephone Encounter (Signed)
Reviewed with Dr. Ali Lowe, pt should receive note requested to be excused from jury duty at this time while she is undergoing cardiac testing for chest pain.  Letter sent to patient in [place].

## 2021-07-01 ENCOUNTER — Ambulatory Visit (HOSPITAL_COMMUNITY): Payer: Medicare Other | Attending: Cardiovascular Disease

## 2021-07-01 ENCOUNTER — Other Ambulatory Visit: Payer: Self-pay

## 2021-07-01 DIAGNOSIS — R079 Chest pain, unspecified: Secondary | ICD-10-CM | POA: Diagnosis present

## 2021-07-01 DIAGNOSIS — E782 Mixed hyperlipidemia: Secondary | ICD-10-CM | POA: Insufficient documentation

## 2021-07-01 LAB — ECHOCARDIOGRAM COMPLETE
Area-P 1/2: 3.65 cm2
S' Lateral: 2.7 cm

## 2021-07-02 ENCOUNTER — Telehealth (HOSPITAL_COMMUNITY): Payer: Self-pay | Admitting: Emergency Medicine

## 2021-07-02 NOTE — Telephone Encounter (Signed)
Reaching out to patient to offer assistance regarding upcoming cardiac imaging study; pt verbalizes understanding of appt date/time, parking situation and where to check in, pre-test NPO status and medications ordered, and verified current allergies; name and call back number provided for further questions should they arise Marchia Bond RN [facility] [phone] office [phone] cell  Arrival time 12:30p Denies iv issues Denies claustro 100mg  metoprolol tart

## 2021-07-03 ENCOUNTER — Ambulatory Visit (HOSPITAL_COMMUNITY)
Admission: RE | Admit: 2021-07-03 | Discharge: 2021-07-03 | Disposition: A | Discharge status: Home / Self Care | Payer: Medicare Other | Source: Ambulatory Visit | Attending: Internal Medicine | Admitting: Internal Medicine

## 2021-07-03 ENCOUNTER — Other Ambulatory Visit: Payer: Self-pay

## 2021-07-03 DIAGNOSIS — E782 Mixed hyperlipidemia: Secondary | ICD-10-CM | POA: Diagnosis present

## 2021-07-03 DIAGNOSIS — R7303 Prediabetes: Secondary | ICD-10-CM | POA: Diagnosis present

## 2021-07-03 DIAGNOSIS — R079 Chest pain, unspecified: Secondary | ICD-10-CM | POA: Diagnosis present

## 2021-07-03 DIAGNOSIS — R072 Precordial pain: Secondary | ICD-10-CM | POA: Insufficient documentation

## 2021-07-03 MED ORDER — NITROGLYCERIN 0.4 MG SL SUBL
SUBLINGUAL_TABLET | SUBLINGUAL | Status: AC
  Filled 2021-07-03: qty 2

## 2021-07-03 MED ORDER — IOHEXOL 350 MG/ML SOLN
95.0000 mL | Freq: Once | INTRAVENOUS | Status: AC | PRN
  Administered 2021-07-03: 13:00:00 95 mL via INTRAVENOUS

## 2021-07-03 MED ORDER — NITROGLYCERIN 0.4 MG SL SUBL
0.8000 mg | SUBLINGUAL_TABLET | Freq: Once | SUBLINGUAL | Status: AC
  Administered 2021-07-03: 13:00:00 0.8 mg via SUBLINGUAL

## 2021-07-04 ENCOUNTER — Encounter: Payer: Self-pay | Admitting: Internal Medicine

## 2021-07-15 ENCOUNTER — Other Ambulatory Visit (HOSPITAL_COMMUNITY): Payer: Medicare Other

## 2021-07-16 ENCOUNTER — Other Ambulatory Visit: Payer: Self-pay | Admitting: Family Medicine

## 2021-07-16 DIAGNOSIS — F411 Generalized anxiety disorder: Secondary | ICD-10-CM

## 2021-07-18 ENCOUNTER — Ambulatory Visit: Payer: Medicare Other | Admitting: Cardiovascular Disease

## 2021-07-21 ENCOUNTER — Encounter: Payer: Self-pay | Admitting: *Deleted

## 2021-08-05 ENCOUNTER — Encounter: Payer: Self-pay | Admitting: Internal Medicine

## 2021-08-05 ENCOUNTER — Ambulatory Visit (INDEPENDENT_AMBULATORY_CARE_PROVIDER_SITE_OTHER): Payer: Medicare Other | Admitting: Internal Medicine

## 2021-08-05 VITALS — BP 124/88 | HR 90 | Resp 98 | Ht 63.0 in | Wt 173.0 lb

## 2021-08-05 DIAGNOSIS — K219 Gastro-esophageal reflux disease without esophagitis: Secondary | ICD-10-CM

## 2021-08-05 DIAGNOSIS — K5909 Other constipation: Secondary | ICD-10-CM | POA: Diagnosis not present

## 2021-08-05 DIAGNOSIS — Z86010 Personal history of colonic polyps: Secondary | ICD-10-CM | POA: Diagnosis not present

## 2021-08-05 MED ORDER — PLENVU 140 G PO SOLR: ORAL | 0 refills | Status: DC

## 2021-08-05 NOTE — Patient Instructions (Signed)
You have been scheduled for an endoscopy and colonoscopy. Please follow the written instructions given to you at your visit today. Please pick up your prep supplies at the pharmacy within the next 1-3 days. If you use inhalers (even only as needed), please bring them with you on the day of your procedure.   Please continue prilosec as prescribed by your primary care doctor.  If you are age 65 or older, your body mass index should be between 23-30. Your Body mass index is 30.65 kg/m. If this is out of the aforementioned range listed, please consider follow up with your Primary Care Provider.  If you are age 69 or younger, your body mass index should be between 19-25. Your Body mass index is 30.65 kg/m. If this is out of the aformentioned range listed, please consider follow up with your Primary Care Provider.   ________________________________________________________  The [facility] providers would like to encourage you to use [facility] to communicate with providers for non-urgent requests or questions.  Due to long hold times on the telephone, sending your provider a message by [facility] may be a faster and more efficient way to get a response.  Please allow 48 business hours for a response.  Please remember that this is for non-urgent requests.  _______________________________________________________  Due to recent changes in healthcare laws, you may see the results of your imaging and laboratory studies on MyChart before your provider has had a chance to review them.  We understand that in some cases there may be results that are confusing or concerning to you. Not all laboratory results come back in the same time frame and the provider may be waiting for multiple results in order to interpret others.  Please give [place] 48 hours in order for your provider to thoroughly review all the results before contacting the office for clarification of your results.

## 2021-08-05 NOTE — Progress Notes (Signed)
Patient ID: Kathryn Cummings, female   DOB: 10-01-1955, 65 y.o.   MRN: [id number] HPI: Kathryn Cummings is a 65 year old female with a past medical history of GERD, colon polyp, diverticulosis, chronic constipation who is seen to discuss colon polyp surveillance, GERD and mild change in bowel habit.  She is here alone today.  She has GI history with Dr. Watt Climes with [facility].  She had a colonoscopy performed on 01/31/2015 with a "adequate" prep.  She had a small polyp at the hepatic flexure which was semisessile removed with hot snare.  No dimensions were reported.  Pathology showed polypoid colorectal mucosa with significant thermal artifact.  5-year recall was recommended.  She reports that she has 40 years of constipation but over the last year she has noticed that she will have periods of "dumping".  She can have small incomplete bowel movements and even go several days without a bowel movement but then have a day where she will have a complete emptying with multiple looser stools throughout the day.  No blood in stool or melena.  No abdominal pain associated with this change.  She is not taking any regular laxatives.  She has not had a medication change.  From a reflux perspective she reports that she has been on omeprazole for 5 years.  She has tried to wean off of this on at least 2 occasions for 3 to 4 weeks and has severe recurrent pyrosis.  She uses Tums frequently when this occurs.  She notes that the last 5 to 7 years been particularly stressful and she wonders if her reflux is worse because of this.  Her husband needed a bone marrow transplant for MDS in 2013 and he has had ongoing medical issues.  She does not have dysphagia or odynophagia.  With the omeprazole her heartburn is well controlled but she will occasionally have breakthrough symptoms.  She has not had recent nausea or vomiting.  Gallbladder was removed in 2001.  Her sister has Barrett's esophagus.  She had CT scan coronary calcium scoring done  and the additional images suggested a small hiatal hernia.  Past Medical History:  Diagnosis Date   Arthritis    Basal cell adenocarcinoma    Diverticulosis    Elevated cholesterol    GERD without esophagitis    Heartburn    History of chicken pox    Insomnia    Migraine headache    Osteoarthritis    Lumbar spine; hands; feet   UTI (lower urinary tract infection)     Past Surgical History:  Procedure Laterality Date   ABDOMINAL HYSTERECTOMY  1988   APPENDECTOMY  1988   BASAL CELL CARCINOMA EXCISION     Multiple   BLADDER REPAIR  2000   Mesh   BREAST BIOPSY  1990   CHOLECYSTECTOMY      Outpatient Medications Prior to Visit  Medication Sig Dispense Refill   ALPRAZolam (XANAX) 0.25 MG tablet Take 0.5-1 tablets (0.125-0.25 mg total) by mouth at bedtime as needed for anxiety. 30 tablet 1   atorvastatin (LIPITOR) 40 MG tablet TAKE 1 TABLET BY MOUTH EVERY DAY 90 tablet 1   cetirizine (ZYRTEC) 10 MG chewable tablet Chew 10 mg by mouth daily as needed for allergies.     FLUoxetine (PROZAC) 20 MG capsule Take 1 capsule (20 mg total) by mouth daily. 90 capsule 3   Nutritional Supplements (JUICE PLUS FIBRE) LIQD Take by mouth daily.     omeprazole (PRILOSEC) 40 MG capsule Take 1  capsule (40 mg total) by mouth daily. 90 capsule 0   SUMAtriptan (IMITREX) 100 MG tablet TAKE 1 TABLET BY MOUTH AS NEEDED FOR HEADACHE. MAY REPEAT IN 2 HOURS IF HEADACHE PERSISTS OR RECURS 10 tablet 3   aspirin EC 81 MG tablet Take 1 tablet (81 mg total) by mouth daily. Swallow whole. 90 tablet 3   FLUoxetine (PROZAC) 10 MG capsule Take 10 mg by mouth daily.     metoprolol tartrate (LOPRESSOR) 100 MG tablet Take 1 tablet (100 mg total) by mouth once for 1 dose. Take 90-120 minutes prior to scan. 1 tablet 0   nitroGLYCERIN (NITROSTAT) 0.4 MG SL tablet Place 1 tablet (0.4 mg total) under the tongue every 5 (five) minutes as needed for chest pain. 25 tablet 3   predniSONE (DELTASONE) 10 MG tablet Take 1 tablet by  mouth as directed.     No facility-administered medications prior to visit.    Allergies  Allergen Reactions   Other Rash    Bactrium     Family History  Problem Relation Age of Onset   25 Mother        Living   Diabetes Mother    Hyperlipidemia Mother    Hypertension Mother    29 Father        Living   Diabetes Father    Hypertension Father    Healthy Sister        x1   Healthy Brother        x1   Heart attack Maternal Grandmother    Alzheimer's disease Maternal Grandmother    Heart attack Maternal Grandfather    Dementia Paternal Grandmother    Pancreatic cancer Paternal Grandfather    Healthy Son        x2   Breast cancer Maternal Aunt    Colon cancer Neg Hx    Colon polyps Neg Hx    Liver disease Neg Hx     Social History   Tobacco Use   Smoking status: Never   Smokeless tobacco: Never  Vaping Use   Vaping Use: Never used  Substance Use Topics   Alcohol use: Yes    Alcohol/week: 2.0 standard drinks    Types: 2 Standard drinks or equivalent per week    Comment: occasinal wine/beer   Drug use: No    ROS: As per history of present illness, otherwise negative  BP 124/88    Pulse 90    Resp (!) 98    Ht 5\' 3"  (1.6 m)    Wt 173 lb (78.5 kg)    BMI 30.65 kg/m  Constitutional: Well-developed and well-nourished. No distress. HEENT: Normocephalic and atraumatic.  No scleral icterus. Cardiovascular: Normal rate, regular rhythm and intact distal pulses. No M/R/G Pulmonary/chest: Effort normal and breath sounds normal. No wheezing, rales or rhonchi. Abdominal: Soft, nontender, nondistended. Bowel sounds active throughout. There are no masses palpable. No hepatosplenomegaly. Extremities: no clubbing, cyanosis, or edema Neurological: Alert and oriented to person place and time. Skin: Skin is warm and dry.  Psychiatric: Normal mood and affect. Behavior is normal.  RELEVANT LABS AND IMAGING: CBC    Component Value Date/Time   WBC  5.5 06/09/2021 1200   RBC 4.84 06/09/2021 1200   HGB 14.1 06/09/2021 1200   HCT 42.3 06/09/2021 1200   PLT 318.0 06/09/2021 1200   MCV 87.3 06/09/2021 1200   MCHC 33.3 06/09/2021 1200   RDW 13.2 06/09/2021 1200   LYMPHSABS 2.1 02/27/2019 1030   MONOABS 0.3  02/27/2019 1030   EOSABS 0.1 02/27/2019 1030   BASOSABS 0.1 02/27/2019 1030    CMP     Component Value Date/Time   NA 137 06/09/2021 1200   K 3.7 06/09/2021 1200   CL 99 06/09/2021 1200   CO2 27 06/09/2021 1200   GLUCOSE 79 06/09/2021 1200   BUN 19 06/09/2021 1200   CREATININE 0.87 06/09/2021 1200   CREATININE 1.03 (H) 06/18/2020 1042   CALCIUM 10.6 (H) 06/09/2021 1200   PROT 7.9 06/09/2021 1200   ALBUMIN 4.7 06/09/2021 1200   AST 30 06/09/2021 1200   ALT 33 06/09/2021 1200   ALKPHOS 84 06/09/2021 1200   BILITOT 0.6 06/09/2021 1200   GFRNONAA 57 (L) 06/18/2020 1042   GFRAA 67 06/18/2020 1042    ASSESSMENT/PLAN: 65 year old female with a past medical history of GERD, colon polyp, diverticulosis, chronic constipation who is seen to discuss colon polyp surveillance, GERD and mild change in bowel habit.    GERD --chronic symptoms responsive to PPI.  We discussed how there can be rebound due to elevated gastrin levels when PPI stopped.  We did discuss the risk, benefits and alternatives to chronic PPI therapy.  It is likely that she will need ongoing acid suppression given frequent heartburn symptoms.  I recommended upper endoscopy to rule out Barrett's esophagus given her personal history of reflux and her family history of Barrett's esophagus. --Continue omeprazole 40 mg once daily --Upper endoscopy in the [place]  2.  History of colon polyp with thermal artifact --she had colonoscopy in 2016 and hot snare polypectomy did not show adenomatous change or sessile serrated polyp but there was noted to be thermal artifact.  She also recalls being told by her gastroenterologist to repeat the test in 5 years because the prep "was not  that good".  For both of these reasons I think repeating colonoscopy is reasonable.  We reviewed the risk, benefits and alternatives to colonoscopy and she is agreeable and wishes to proceed --Colonoscopy with 2-day bowel prep --If normal consideration of adding Benefiber to help regulate bowel movements  3.  Chronic constipation --we are proceeding with colonoscopy and I suggest we try Benefiber or Metamucil thereafter assuming unremarkable colonoscopy      FB:PZWCHE, Malka So, [address] [place],  [place] [address]

## 2021-08-17 ENCOUNTER — Other Ambulatory Visit: Payer: Self-pay | Admitting: Family Medicine

## 2021-08-17 DIAGNOSIS — F411 Generalized anxiety disorder: Secondary | ICD-10-CM

## 2021-09-04 ENCOUNTER — Other Ambulatory Visit: Payer: Self-pay | Admitting: Family Medicine

## 2021-09-04 DIAGNOSIS — K219 Gastro-esophageal reflux disease without esophagitis: Secondary | ICD-10-CM

## 2021-09-09 ENCOUNTER — Other Ambulatory Visit (HOSPITAL_COMMUNITY): Payer: Self-pay | Admitting: Orthopedic Surgery

## 2021-09-16 ENCOUNTER — Encounter: Payer: Self-pay | Admitting: Certified Registered Nurse Anesthetist

## 2021-09-17 ENCOUNTER — Encounter: Payer: Self-pay | Admitting: Internal Medicine

## 2021-09-17 ENCOUNTER — Ambulatory Visit (AMBULATORY_SURGERY_CENTER): Payer: Medicare Other | Admitting: Internal Medicine

## 2021-09-17 VITALS — BP 131/73 | HR 64 | Temp 98.0°F | Resp 15 | Ht 63.0 in | Wt 173.0 lb

## 2021-09-17 DIAGNOSIS — Z860100 Personal history of colon polyps, unspecified: Secondary | ICD-10-CM

## 2021-09-17 DIAGNOSIS — D125 Benign neoplasm of sigmoid colon: Secondary | ICD-10-CM | POA: Diagnosis not present

## 2021-09-17 DIAGNOSIS — D122 Benign neoplasm of ascending colon: Secondary | ICD-10-CM | POA: Diagnosis not present

## 2021-09-17 DIAGNOSIS — K219 Gastro-esophageal reflux disease without esophagitis: Secondary | ICD-10-CM | POA: Diagnosis not present

## 2021-09-17 MED ORDER — SODIUM CHLORIDE 0.9 % IV SOLN: 500.0000 mL | Freq: Once | INTRAVENOUS | Status: DC

## 2021-09-17 NOTE — Progress Notes (Signed)
Report given to PACU, vss 

## 2021-09-17 NOTE — Progress Notes (Signed)
Called to room to assist during endoscopic procedure.  Patient ID and intended procedure confirmed with present staff. Received instructions for my participation in the procedure from the performing physician.  

## 2021-09-17 NOTE — Patient Instructions (Signed)
YOU HAD AN ENDOSCOPIC PROCEDURE TODAY AT THE [facility]:   Refer to the procedure report that was given to you for any specific questions about what was found during the examination.  If the procedure report does not answer your questions, please call your gastroenterologist to clarify.  If you requested that your care partner not be given the details of your procedure findings, then the procedure report has been included in a sealed envelope for you to review at your convenience later.  YOU SHOULD EXPECT: Some feelings of bloating in the abdomen. Passage of more gas than usual.  Walking can help get rid of the air that was put into your GI tract during the procedure and reduce the bloating. If you had a lower endoscopy (such as a colonoscopy or flexible sigmoidoscopy) you may notice spotting of blood in your stool or on the toilet paper. If you underwent a bowel prep for your procedure, you may not have a normal bowel movement for a few days.  Please Note:  You might notice some irritation and congestion in your nose or some drainage.  This is from the oxygen used during your procedure.  There is no need for concern and it should clear up in a day or so.  SYMPTOMS TO REPORT IMMEDIATELY:   Following lower endoscopy (colonoscopy or flexible sigmoidoscopy):  Excessive amounts of blood in the stool  Significant tenderness or worsening of abdominal pains  Swelling of the abdomen that is new, acute  Fever of 100F or higher   Following upper endoscopy (EGD)  Vomiting of blood or coffee ground material  New chest pain or pain under the shoulder blades  Painful or persistently difficult swallowing  New shortness of breath  Fever of 100F or higher  Black, tarry-looking stools  For urgent or emergent issues, a gastroenterologist can be reached at any hour by calling [phone]. Do not use MyChart messaging for urgent concerns.    DIET:  We do recommend a small meal at first, but  then you may proceed to your regular diet.  Drink plenty of fluids but you should avoid alcoholic beverages for 24 hours.  ACTIVITY:  You should plan to take it easy for the rest of today and you should NOT DRIVE or use heavy machinery until tomorrow (because of the sedation medicines used during the test).    FOLLOW UP: Our staff will call the number listed on your records 48-72 hours following your procedure to check on you and address any questions or concerns that you may have regarding the information given to you following your procedure. If we do not reach you, we will leave a message.  We will attempt to reach you two times.  During this call, we will ask if you have developed any symptoms of COVID 19. If you develop any symptoms (ie: fever, flu-like symptoms, shortness of breath, cough etc.) before then, please call [phone].  If you test positive for Covid 19 in the 2 weeks post procedure, please call and report this information to us.    If any biopsies were taken you will be contacted by phone or by letter within the next 1-3 weeks.  Please call us at [phone] if you have not heard about the biopsies in 3 weeks.    SIGNATURES/CONFIDENTIALITY: You and/or your care partner have signed paperwork which will be entered into your electronic medical record.  These signatures attest to the fact that that the information above on   your After Visit Summary has been reviewed and is understood.  Full responsibility of the confidentiality of this discharge information lies with you and/or your care-partner. 

## 2021-09-17 NOTE — Op Note (Signed)
[facility] Patient Name: Kathryn Cummings Procedure Date: 09/17/2021 3:07 PM MRN: [id number] Endoscopist: Jerene Bears , MD Age: 66 Referring MD:  Date of Birth: 1956-03-30 Gender: Female Account #: [id number] Procedure:                Colonoscopy Indications:              High risk colon cancer surveillance: Personal                            history of colonic polyp, Last colonoscopy: June                            2016 (1 polyp removed with path showing thermal                            artifact with otherwise unknown histology) Medicines:                Monitored Anesthesia Care Procedure:                Pre-Anesthesia Assessment:                           - Prior to the procedure, a History and Physical                            was performed, and patient medications and                            allergies were reviewed. The patient's tolerance of                            previous anesthesia was also reviewed. The risks                            and benefits of the procedure and the sedation                            options and risks were discussed with the patient.                            All questions were answered, and informed consent                            was obtained. Prior Anticoagulants: The patient has                            taken no previous anticoagulant or antiplatelet                            agents. ASA Grade Assessment: II - A patient with                            mild systemic disease. After reviewing the risks  and benefits, the patient was deemed in                            satisfactory condition to undergo the procedure.                           After obtaining informed consent, the colonoscope                            was passed under direct vision. Throughout the                            procedure, the patient's blood pressure, pulse, and                            oxygen saturations were monitored  continuously. The                            Olympus CF-[id number] ([id number]) Colonoscope was                            introduced through the anus and advanced to the                            cecum, identified by appendiceal orifice and                            ileocecal valve. The colonoscopy was performed                            without difficulty. The patient tolerated the                            procedure well. The quality of the bowel                            preparation was good. The ileocecal valve,                            appendiceal orifice, and rectum were photographed. Scope In: 3:19:18 PM Scope Out: 3:35:37 PM Scope Withdrawal Time: 0 hours 11 minutes 20 seconds  Total Procedure Duration: 0 hours 16 minutes 19 seconds  Findings:                 The digital rectal exam was normal.                           A 2 mm polyp was found in the ascending colon. The                            polyp was sessile. The polyp was removed with a                            cold snare. Resection was complete, but the polyp  tissue was not retrieved.                           A 3 mm polyp was found in the sigmoid colon. The                            polyp was sessile. The polyp was removed with a                            cold snare. Resection and retrieval were complete.                           Multiple small and large-mouthed diverticula were                            found in the sigmoid colon, descending colon and                            ascending colon.                           Internal hemorrhoids were found during                            retroflexion. The hemorrhoids were small. Complications:            No immediate complications. Estimated Blood Loss:     Estimated blood loss was minimal. Impression:               - One 2 mm polyp in the ascending colon, removed                            with a cold snare. Complete resection. Polyp  tissue                            not retrieved.                           - One 3 mm polyp in the sigmoid colon, removed with                            a cold snare. Resected and retrieved.                           - Moderate diverticulosis in the sigmoid colon, in                            the descending colon and in the ascending colon.                           - Small internal hemorrhoids. Recommendation:           - Patient has a contact number available for  emergencies. The signs and symptoms of potential                            delayed complications were discussed with the                            patient. Return to normal activities tomorrow.                            Written discharge instructions were provided to the                            patient.                           - Resume previous diet.                           - Continue present medications.                           - Await pathology results.                           - Repeat colonoscopy is recommended for                            surveillance. The colonoscopy date will be                            determined after pathology results from today's                            exam become available for review. Jerene Bears, MD 09/17/2021 3:42:20 PM This report has been signed electronically.

## 2021-09-17 NOTE — Op Note (Signed)
[facility] Patient Name: Kathryn Cummings Procedure Date: 09/17/2021 3:07 PM MRN: [id number] Endoscopist: Jerene Bears , MD Age: 66 Referring MD:  Date of Birth: 06/09/1956 Gender: Female Account #: [id number] Procedure:                Upper GI endoscopy Indications:              Gastro-esophageal reflux disease Medicines:                Monitored Anesthesia Care Procedure:                Pre-Anesthesia Assessment:                           - Prior to the procedure, a History and Physical                            was performed, and patient medications and                            allergies were reviewed. The patient's tolerance of                            previous anesthesia was also reviewed. The risks                            and benefits of the procedure and the sedation                            options and risks were discussed with the patient.                            All questions were answered, and informed consent                            was obtained. Prior Anticoagulants: The patient has                            taken no previous anticoagulant or antiplatelet                            agents. ASA Grade Assessment: II - A patient with                            mild systemic disease. After reviewing the risks                            and benefits, the patient was deemed in                            satisfactory condition to undergo the procedure.                           After obtaining informed consent, the endoscope was  passed under direct vision. Throughout the                            procedure, the patient's blood pressure, pulse, and                            oxygen saturations were monitored continuously. The                            GIF HQ190 #[id number] was introduced through the                            mouth, and advanced to the second part of duodenum.                            The upper GI endoscopy was  accomplished without                            difficulty. The patient tolerated the procedure                            well. Scope In: Scope Out: Findings:                 The examined esophagus was normal.                           The gastroesophageal flap valve was visualized                            endoscopically and classified as Hill Grade III                            (minimal fold, loose to endoscope, hiatal hernia                            likely).                           A 2 cm hiatal hernia was present.                           The entire examined stomach was normal.                           The examined duodenum was normal. Complications:            No immediate complications. Estimated Blood Loss:     Estimated blood loss: none. Impression:               - Normal esophagus.                           - 2 cm (small) hiatal hernia.                           - Normal stomach.                           -  Normal examined duodenum.                           - No specimens collected. Recommendation:           - Patient has a contact number available for                            emergencies. The signs and symptoms of potential                            delayed complications were discussed with the                            patient. Return to normal activities tomorrow.                            Written discharge instructions were provided to the                            patient.                           - Resume previous diet.                           - Continue present medications. Jerene Bears, MD 09/17/2021 3:39:36 PM This report has been signed electronically.

## 2021-09-17 NOTE — Progress Notes (Signed)
GASTROENTEROLOGY PROCEDURE H&P NOTE   Primary Care Physician: Martinique, Betty G, MD    Reason for Procedure:  Chronic GERD history of colon polyp  Plan:    EGD and colonoscopy  Patient is appropriate for endoscopic procedure(s) in the ambulatory ([facility]) setting.  The nature of the procedure, as well as the risks, benefits, and alternatives were carefully and thoroughly reviewed with the patient. Ample time for discussion and questions allowed. The patient understood, was satisfied, and agreed to proceed.     HPI: Kathryn Cummings is a 66 y.o. female who presents for EGD and colonoscopy.  Medical history as below.  Seen in the office on 08/05/2021.  See that note for details.  No significant change in medical history since that time.  Tolerated the prep.  No recent chest pain or shortness of breath.  Past Medical History:  Diagnosis Date   Anxiety    Arthritis    Basal cell adenocarcinoma    Diverticulosis    Elevated cholesterol    GERD without esophagitis    Heartburn    History of chicken pox    Insomnia    Migraine headache    Osteoarthritis    Lumbar spine; hands; feet   UTI (lower urinary tract infection)     Past Surgical History:  Procedure Laterality Date   ABDOMINAL HYSTERECTOMY  1988   APPENDECTOMY  1988   BASAL CELL CARCINOMA EXCISION     Multiple   BLADDER REPAIR  2000   Mesh   BREAST BIOPSY  1990   CHOLECYSTECTOMY      Prior to Admission medications   Medication Sig Start Date End Date Taking? Authorizing Provider  cetirizine (ZYRTEC) 10 MG chewable tablet Chew 10 mg by mouth daily as needed for allergies.   Yes [provider]  FLUoxetine (PROZAC) 20 MG capsule TAKE 1 CAPSULE BY MOUTH EVERY DAY 08/19/21  Yes Martinique, Betty G, MD  Nutritional Supplements (JUICE PLUS FIBRE) LIQD Take by mouth daily.   Yes [provider]  omeprazole (PRILOSEC) 40 MG capsule TAKE 1 CAPSULE (40 MG TOTAL) BY MOUTH DAILY. 09/05/21  Yes Martinique, Betty G, MD   ALPRAZolam Duanne Moron) 0.25 MG tablet Take 0.5-1 tablets (0.125-0.25 mg total) by mouth at bedtime as needed for anxiety. 06/18/20   Martinique, Betty G, MD  atorvastatin (LIPITOR) 40 MG tablet TAKE 1 TABLET BY MOUTH EVERY DAY 03/10/21   Martinique, Betty G, MD  SUMAtriptan (IMITREX) 100 MG tablet TAKE 1 TABLET BY MOUTH AS NEEDED FOR HEADACHE. MAY REPEAT IN 2 HOURS IF HEADACHE PERSISTS OR RECURS 05/10/15   Brunetta Jeans, PA-C    Current Outpatient Medications  Medication Sig Dispense Refill   cetirizine (ZYRTEC) 10 MG chewable tablet Chew 10 mg by mouth daily as needed for allergies.     FLUoxetine (PROZAC) 20 MG capsule TAKE 1 CAPSULE BY MOUTH EVERY DAY 90 capsule 1   Nutritional Supplements (JUICE PLUS FIBRE) LIQD Take by mouth daily.     omeprazole (PRILOSEC) 40 MG capsule TAKE 1 CAPSULE (40 MG TOTAL) BY MOUTH DAILY. 90 capsule 0   ALPRAZolam (XANAX) 0.25 MG tablet Take 0.5-1 tablets (0.125-0.25 mg total) by mouth at bedtime as needed for anxiety. 30 tablet 1   atorvastatin (LIPITOR) 40 MG tablet TAKE 1 TABLET BY MOUTH EVERY DAY 90 tablet 1   SUMAtriptan (IMITREX) 100 MG tablet TAKE 1 TABLET BY MOUTH AS NEEDED FOR HEADACHE. MAY REPEAT IN 2 HOURS IF HEADACHE PERSISTS OR RECURS 10 tablet  3   Current Facility-Administered Medications  Medication Dose Route Frequency Provider Last Rate Last Admin   0.9 %  sodium chloride infusion  500 mL Intravenous Once Female Minish, Lajuan Lines, MD        Allergies as of 09/17/2021 - Review Complete 09/17/2021  Allergen Reaction Noted   Other Rash 02/01/2018    Family History  Problem Relation Age of Onset   Arthritis-Osteo Mother        Living   Diabetes Mother    Hyperlipidemia Mother    Hypertension Mother    Arthritis-Osteo Father        Living   Diabetes Father    Hypertension Father    Healthy Sister        x1   Healthy Brother        x1   Breast cancer Maternal Aunt    Heart attack Maternal Grandmother    Alzheimer's disease Maternal Grandmother     Heart attack Maternal Grandfather    Dementia Paternal Grandmother    Pancreatic cancer Paternal Grandfather    Healthy Son        x2   Colon cancer Neg Hx    Colon polyps Neg Hx    Liver disease Neg Hx    Esophageal cancer Neg Hx    Stomach cancer Neg Hx    Rectal cancer Neg Hx     Social History   Socioeconomic History   Marital status: Married    Spouse name: Not on file   Number of children: Not on file   Years of education: Not on file   Highest education level: Not on file  Occupational History   Not on file  Tobacco Use   Smoking status: Never   Smokeless tobacco: Never  Vaping Use   Vaping Use: Never used  Substance and Sexual Activity   Alcohol use: Yes    Alcohol/week: 2.0 standard drinks    Types: 2 Standard drinks or equivalent per week    Comment: occasinal wine/beer   Drug use: No   Sexual activity: Yes    Partners: Male  Other Topics Concern   Not on file  Social History Narrative   [place].   Social Determinants of Health   Financial Resource Strain: Not on file  Food Insecurity: Not on file  Transportation Needs: Not on file  Physical Activity: Not on file  Stress: Not on file  Social Connections: Not on file  Intimate Partner Violence: Not on file    Physical Exam: Vital signs in last 24 hours: @BP  115/73    Pulse 98    Temp 98 F (36.7 C)    Ht 5\' 3"  (1.6 m)    Wt 173 lb (78.5 kg)    SpO2 98%    BMI 30.65 kg/m  GEN: NAD EYE: Sclerae anicteric ENT: MMM CV: Non-tachycardic Pulm: CTA b/l GI: Soft, NT/ND NEURO:  Alert & Oriented x 3   Zenovia Jarred, MD [facility]  09/17/2021 2:59 PM

## 2021-09-19 ENCOUNTER — Telehealth: Payer: Self-pay | Admitting: *Deleted

## 2021-09-19 NOTE — Telephone Encounter (Signed)
°  Follow up Call-  Call back number 09/17/2021  Post procedure Call Back phone  # [phone]  Permission to leave phone message Yes  Some recent data might be hidden     Patient questions:  Do you have a fever, pain , or abdominal swelling? No. Pain Score  0 *  Have you tolerated food without any problems? Yes.    Have you been able to return to your normal activities? Yes.    Do you have any questions about your discharge instructions: Diet   No. Medications  No. Follow up visit  No.  Do you have questions or concerns about your Care? No.  Actions: * If pain score is 4 or above: No action needed, pain <4.

## 2021-09-22 ENCOUNTER — Telehealth: Payer: Medicare Other | Admitting: Physician Assistant

## 2021-09-22 DIAGNOSIS — J208 Acute bronchitis due to other specified organisms: Secondary | ICD-10-CM

## 2021-09-22 MED ORDER — BENZONATATE 100 MG PO CAPS: 100.0000 mg | ORAL_CAPSULE | Freq: Three times a day (TID) | ORAL | 0 refills | Status: DC | PRN

## 2021-09-22 NOTE — Progress Notes (Signed)
I have spent 5 minutes in review of e-visit questionnaire, review and updating patient chart, medical decision making and response to patient.   Deago Burruss Cody Naveyah Iacovelli, PA-C    

## 2021-09-22 NOTE — Progress Notes (Signed)

## 2021-09-23 ENCOUNTER — Encounter: Payer: Self-pay | Admitting: Internal Medicine

## 2021-10-03 ENCOUNTER — Ambulatory Visit: Payer: Medicare Other | Admitting: Internal Medicine

## 2021-10-19 ENCOUNTER — Telehealth: Payer: Medicare Other | Admitting: Family

## 2021-10-19 DIAGNOSIS — J208 Acute bronchitis due to other specified organisms: Secondary | ICD-10-CM | POA: Diagnosis not present

## 2021-10-19 DIAGNOSIS — B9689 Other specified bacterial agents as the cause of diseases classified elsewhere: Secondary | ICD-10-CM | POA: Diagnosis not present

## 2021-10-19 MED ORDER — AZITHROMYCIN 250 MG PO TABS: ORAL_TABLET | ORAL | 0 refills | Status: DC

## 2021-10-19 MED ORDER — BENZONATATE 100 MG PO CAPS: 100.0000 mg | ORAL_CAPSULE | Freq: Three times a day (TID) | ORAL | 0 refills | Status: DC | PRN

## 2021-10-19 MED ORDER — PREDNISONE 10 MG (21) PO TBPK: ORAL_TABLET | ORAL | 0 refills | Status: DC

## 2021-10-19 MED ORDER — PROMETHAZINE-DM 6.25-15 MG/5ML PO SYRP: 5.0000 mL | ORAL_SOLUTION | Freq: Three times a day (TID) | ORAL | 0 refills | Status: DC | PRN

## 2021-10-19 NOTE — Progress Notes (Signed)
We are sorry that you are not feeling well.  Here is how we plan to help!  Based on your presentation I believe you most likely have A cough due to bacteria.  When patients have a fever and a productive cough with a change in color or increased sputum production, we are concerned about bacterial bronchitis.  If left untreated it can progress to pneumonia.  If your symptoms do not improve with your treatment plan it is important that you contact your provider.   I have prescribed Azithromyin 250 mg: two tablets now and then one tablet daily for 4 additonal days    In addition you may use A non-prescription cough medication called Robitussin DAC. Take 2 teaspoons every 8 hours or Delsym: take 2 teaspoons every 12 hours., A non-prescription cough medication called Mucinex DM: take 2 tablets every 12 hours., and A prescription cough medication called Tessalon Perles 100mg. You may take 1-2 capsules every 8 hours as needed for your cough.  Prednisone 10 mg daily for 6 days (see taper instructions below)  Directions for 6 day taper: Day 1: 2 tablets before breakfast, 1 after both lunch & dinner and 2 at bedtime Day 2: 1 tab before breakfast, 1 after both lunch & dinner and 2 at bedtime Day 3: 1 tab at each meal & 1 at bedtime Day 4: 1 tab at breakfast, 1 at lunch, 1 at bedtime Day 5: 1 tab at breakfast & 1 tab at bedtime Day 6: 1 tab at breakfast  From your responses in the eVisit questionnaire you describe inflammation in the upper respiratory tract which is causing a significant cough.  This is commonly called Bronchitis and has four common causes:   Allergies Viral Infections Acid Reflux Bacterial Infection Allergies, viruses and acid reflux are treated by controlling symptoms or eliminating the cause. An example might be a cough caused by taking certain blood pressure medications. You stop the cough by changing the medication. Another example might be a cough caused by acid reflux. Controlling the  reflux helps control the cough.  USE OF BRONCHODILATOR ("RESCUE") INHALERS: There is a risk from using your bronchodilator too frequently.  The risk is that over-reliance on a medication which only relaxes the muscles surrounding the breathing tubes can reduce the effectiveness of medications prescribed to reduce swelling and congestion of the tubes themselves.  Although you feel brief relief from the bronchodilator inhaler, your asthma may actually be worsening with the tubes becoming more swollen and filled with mucus.  This can delay other crucial treatments, such as oral steroid medications. If you need to use a bronchodilator inhaler daily, several times per day, you should discuss this with your provider.  There are probably better treatments that could be used to keep your asthma under control.     HOME CARE Only take medications as instructed by your medical team. Complete the entire course of an antibiotic. Drink plenty of fluids and get plenty of rest. Avoid close contacts especially the very young and the elderly Cover your mouth if you cough or cough into your sleeve. Always remember to wash your hands A steam or ultrasonic humidifier can help congestion.   GET HELP RIGHT AWAY IF: You develop worsening fever. You become short of breath You cough up blood. Your symptoms persist after you have completed your treatment plan MAKE SURE YOU  Understand these instructions. Will watch your condition. Will get help right away if you are not doing well or get worse.      Thank you for choosing an e-visit.  Your e-visit answers were reviewed by a board certified advanced clinical practitioner to complete your personal care plan. Depending upon the condition, your plan could have included both over the counter or prescription medications.  Please review your pharmacy choice. Make sure the pharmacy is open so you can pick up prescription now. If there is a problem, you may contact your  provider through MyChart messaging and have the prescription routed to another pharmacy.  Your safety is important to us. If you have drug allergies check your prescription carefully.   For the next 24 hours you can use MyChart to ask questions about today's visit, request a non-urgent call back, or ask for a work or school excuse. You will get an email in the next two days asking about your experience. I hope that your e-visit has been valuable and will speed your recovery.  .Approximately 5 minutes was spent documenting and reviewing patient's chart.    

## 2021-10-19 NOTE — Addendum Note (Signed)
Addended by: Evelina Dun A on: 10/19/2021 08:58 AM ? ? Modules accepted: Orders ? ?

## 2021-10-21 ENCOUNTER — Encounter (HOSPITAL_BASED_OUTPATIENT_CLINIC_OR_DEPARTMENT_OTHER): Payer: Self-pay | Admitting: Orthopedic Surgery

## 2021-10-28 NOTE — Progress Notes (Signed)

## 2021-10-29 NOTE — Anesthesia Preprocedure Evaluation (Addendum)
Anesthesia Evaluation  ?Patient identified by MRN, date of birth, ID band ?Patient awake ? ? ? ?Reviewed: ?Allergy & Precautions, NPO status , Patient's Chart, lab work & pertinent test results ? ?History of Anesthesia Complications ?(+) PONV and history of anesthetic complications ? ?Airway ?Mallampati: II ? ?TM Distance: >3 FB ?Neck ROM: Full ? ? ? Dental ?no notable dental hx. ? ?  ?Pulmonary ?neg pulmonary ROS,  ?  ?Pulmonary exam normal ? ? ? ? ? ? ? Cardiovascular ?negative cardio ROS ? ? ?Rhythm:Regular Rate:Normal ? ? ?  ?Neuro/Psych ? Headaches, Anxiety   ? GI/Hepatic ?Neg liver ROS, GERD  Medicated,  ?Endo/Other  ?negative endocrine ROS ? Renal/GU ?negative Renal ROS  ?negative genitourinary ?  ?Musculoskeletal ? ?(+) Arthritis , Osteoarthritis,   ? Abdominal ?Normal abdominal exam  (+)   ?Peds ? Hematology ?negative hematology ROS ?(+)   ?Anesthesia Other Findings ? ? Reproductive/Obstetrics ? ?  ? ? ? ? ? ? ? ? ? ? ? ? ? ?  ?  ? ? ? ? ? ? ? ?Anesthesia Physical ?Anesthesia Plan ? ?ASA: 2 ? ?Anesthesia Plan: General and Regional  ? ?Post-op Pain Management: Regional block*, Tylenol PO (pre-op)* and Celebrex PO (pre-op)*  ? ?Induction: Intravenous ? ?PONV Risk Score and Plan: 4 or greater and Ondansetron, Aprepitant, Dexamethasone, Midazolam, Treatment may vary due to age or medical condition and Amisulpride ? ?Airway Management Planned: Mask and Oral ETT ? ?Additional Equipment: None ? ?Intra-op Plan:  ? ?Post-operative Plan: Extubation in OR ? ?Informed Consent: I have reviewed the patients History and Physical, chart, labs and discussed the procedure including the risks, benefits and alternatives for the proposed anesthesia with the patient or authorized representative who has indicated his/her understanding and acceptance.  ? ? ? ?Dental advisory given ? ?Plan Discussed with: CRNA ? ?Anesthesia Plan Comments:   ? ? ? ? ? ?Anesthesia Quick Evaluation ? ?

## 2021-10-30 ENCOUNTER — Ambulatory Visit (HOSPITAL_BASED_OUTPATIENT_CLINIC_OR_DEPARTMENT_OTHER)
Admission: RE | Admit: 2021-10-30 | Discharge: 2021-10-30 | Disposition: A | Discharge status: Home / Self Care | Payer: Medicare Other | Attending: Orthopedic Surgery | Admitting: Orthopedic Surgery

## 2021-10-30 ENCOUNTER — Other Ambulatory Visit: Payer: Self-pay

## 2021-10-30 ENCOUNTER — Encounter (HOSPITAL_BASED_OUTPATIENT_CLINIC_OR_DEPARTMENT_OTHER)
Admission: RE | Disposition: A | Discharge status: Home / Self Care | Payer: Self-pay | Source: Home / Self Care | Attending: Orthopedic Surgery

## 2021-10-30 ENCOUNTER — Ambulatory Visit (HOSPITAL_BASED_OUTPATIENT_CLINIC_OR_DEPARTMENT_OTHER): Payer: Medicare Other | Admitting: Anesthesiology

## 2021-10-30 ENCOUNTER — Encounter (HOSPITAL_BASED_OUTPATIENT_CLINIC_OR_DEPARTMENT_OTHER): Payer: Self-pay | Admitting: Orthopedic Surgery

## 2021-10-30 DIAGNOSIS — M7661 Achilles tendinitis, right leg: Secondary | ICD-10-CM | POA: Insufficient documentation

## 2021-10-30 DIAGNOSIS — M79671 Pain in right foot: Secondary | ICD-10-CM | POA: Diagnosis present

## 2021-10-30 DIAGNOSIS — M6701 Short Achilles tendon (acquired), right ankle: Secondary | ICD-10-CM

## 2021-10-30 DIAGNOSIS — M9261 Juvenile osteochondrosis of tarsus, right ankle: Secondary | ICD-10-CM

## 2021-10-30 DIAGNOSIS — K219 Gastro-esophageal reflux disease without esophagitis: Secondary | ICD-10-CM | POA: Insufficient documentation

## 2021-10-30 DIAGNOSIS — F419 Anxiety disorder, unspecified: Secondary | ICD-10-CM | POA: Insufficient documentation

## 2021-10-30 HISTORY — PX: GASTROC RECESSION EXTREMITY: SHX6262

## 2021-10-30 HISTORY — DX: Other specified postprocedural states: Z98.890

## 2021-10-30 HISTORY — PX: EXCISION HAGLUND'S DEFORMITY WITH ACHILLES TENDON REPAIR: SHX5627

## 2021-10-30 HISTORY — DX: Nausea with vomiting, unspecified: R11.2

## 2021-10-30 SURGERY — RECESSION, TENDON, GASTROCNEMIUS
Anesthesia: Regional | Site: Leg Lower | Laterality: Right

## 2021-10-30 MED ORDER — CELECOXIB 200 MG PO CAPS
200.0000 mg | ORAL_CAPSULE | Freq: Once | ORAL | Status: AC
  Administered 2021-10-30: 200 mg via ORAL

## 2021-10-30 MED ORDER — ACETAMINOPHEN 500 MG PO TABS
ORAL_TABLET | ORAL | Status: AC
  Filled 2021-10-30: qty 2

## 2021-10-30 MED ORDER — FENTANYL CITRATE (PF) 100 MCG/2ML IJ SOLN
50.0000 ug | Freq: Once | INTRAMUSCULAR | Status: AC
  Administered 2021-10-30: 50 ug via INTRAVENOUS

## 2021-10-30 MED ORDER — LACTATED RINGERS IV SOLN: INTRAVENOUS | Status: DC

## 2021-10-30 MED ORDER — VANCOMYCIN HCL 500 MG IV SOLR
INTRAVENOUS | Status: AC
  Filled 2021-10-30: qty 10

## 2021-10-30 MED ORDER — BUPIVACAINE-EPINEPHRINE (PF) 0.5% -1:200000 IJ SOLN
INTRAMUSCULAR | Status: AC
  Filled 2021-10-30: qty 30

## 2021-10-30 MED ORDER — PROPOFOL 10 MG/ML IV BOLUS
INTRAVENOUS | Status: DC | PRN
  Administered 2021-10-30: 150 mg via INTRAVENOUS

## 2021-10-30 MED ORDER — FENTANYL CITRATE (PF) 100 MCG/2ML IJ SOLN
INTRAMUSCULAR | Status: AC
  Filled 2021-10-30: qty 2

## 2021-10-30 MED ORDER — CELECOXIB 200 MG PO CAPS
ORAL_CAPSULE | ORAL | Status: AC
  Filled 2021-10-30: qty 1

## 2021-10-30 MED ORDER — SENNA 8.6 MG PO TABS: 2.0000 | ORAL_TABLET | Freq: Two times a day (BID) | ORAL | 0 refills | Status: DC

## 2021-10-30 MED ORDER — ROCURONIUM BROMIDE 100 MG/10ML IV SOLN
INTRAVENOUS | Status: DC | PRN
  Administered 2021-10-30: 60 mg via INTRAVENOUS

## 2021-10-30 MED ORDER — FENTANYL CITRATE (PF) 100 MCG/2ML IJ SOLN
INTRAMUSCULAR | Status: DC | PRN
  Administered 2021-10-30: 50 ug via INTRAVENOUS

## 2021-10-30 MED ORDER — PHENYLEPHRINE HCL-NACL 20-0.9 MG/250ML-% IV SOLN
INTRAVENOUS | Status: DC | PRN
  Administered 2021-10-30: 35 ug/min via INTRAVENOUS

## 2021-10-30 MED ORDER — PHENYLEPHRINE HCL (PRESSORS) 10 MG/ML IV SOLN
INTRAVENOUS | Status: DC | PRN
  Administered 2021-10-30: 120 ug via INTRAVENOUS
  Administered 2021-10-30: 80 ug via INTRAVENOUS
  Administered 2021-10-30 (×2): 120 ug via INTRAVENOUS
  Administered 2021-10-30: 80 ug via INTRAVENOUS

## 2021-10-30 MED ORDER — ONDANSETRON HCL 4 MG/2ML IJ SOLN
INTRAMUSCULAR | Status: DC | PRN
  Administered 2021-10-30: 4 mg via INTRAVENOUS

## 2021-10-30 MED ORDER — DEXAMETHASONE SODIUM PHOSPHATE 4 MG/ML IJ SOLN
INTRAMUSCULAR | Status: DC | PRN
  Administered 2021-10-30: 4 mg via INTRAVENOUS

## 2021-10-30 MED ORDER — MIDAZOLAM HCL 2 MG/2ML IJ SOLN
2.0000 mg | Freq: Once | INTRAMUSCULAR | Status: AC
  Administered 2021-10-30: 2 mg via INTRAVENOUS

## 2021-10-30 MED ORDER — PROPOFOL 10 MG/ML IV BOLUS
INTRAVENOUS | Status: AC
  Filled 2021-10-30: qty 20

## 2021-10-30 MED ORDER — LIDOCAINE 2% (20 MG/ML) 5 ML SYRINGE
INTRAMUSCULAR | Status: AC
  Filled 2021-10-30: qty 5

## 2021-10-30 MED ORDER — BUPIVACAINE LIPOSOME 1.3 % IJ SUSP
INTRAMUSCULAR | Status: DC | PRN
  Administered 2021-10-30: 10 mL via PERINEURAL

## 2021-10-30 MED ORDER — PHENYLEPHRINE HCL (PRESSORS) 10 MG/ML IV SOLN
INTRAVENOUS | Status: AC
  Filled 2021-10-30: qty 1

## 2021-10-30 MED ORDER — OXYCODONE HCL 5 MG PO TABS: 5.0000 mg | ORAL_TABLET | ORAL | 0 refills | Status: AC | PRN

## 2021-10-30 MED ORDER — DOCUSATE SODIUM 100 MG PO CAPS
100.0000 mg | ORAL_CAPSULE | Freq: Two times a day (BID) | ORAL | 0 refills | Status: DC
Start: 2021-10-30 — End: 2022-06-21

## 2021-10-30 MED ORDER — BUPIVACAINE HCL (PF) 0.5 % IJ SOLN
INTRAMUSCULAR | Status: DC | PRN
  Administered 2021-10-30: 20 mL via PERINEURAL

## 2021-10-30 MED ORDER — SUGAMMADEX SODIUM 200 MG/2ML IV SOLN
INTRAVENOUS | Status: DC | PRN
  Administered 2021-10-30: 200 mg via INTRAVENOUS

## 2021-10-30 MED ORDER — CEFAZOLIN SODIUM-DEXTROSE 2-4 GM/100ML-% IV SOLN
INTRAVENOUS | Status: AC
  Filled 2021-10-30: qty 100

## 2021-10-30 MED ORDER — APREPITANT 40 MG PO CAPS
40.0000 mg | ORAL_CAPSULE | Freq: Once | ORAL | Status: AC
  Administered 2021-10-30: 40 mg via ORAL

## 2021-10-30 MED ORDER — CEFAZOLIN SODIUM-DEXTROSE 2-4 GM/100ML-% IV SOLN
2.0000 g | INTRAVENOUS | Status: AC
  Administered 2021-10-30: 2 g via INTRAVENOUS

## 2021-10-30 MED ORDER — PHENYLEPHRINE 40 MCG/ML (10ML) SYRINGE FOR IV PUSH (FOR BLOOD PRESSURE SUPPORT)
PREFILLED_SYRINGE | INTRAVENOUS | Status: AC
  Filled 2021-10-30: qty 10

## 2021-10-30 MED ORDER — LIDOCAINE HCL (CARDIAC) PF 100 MG/5ML IV SOSY
PREFILLED_SYRINGE | INTRAVENOUS | Status: DC | PRN
Start: 2021-10-30 — End: 2021-10-30
  Administered 2021-10-30: 40 mg via INTRAVENOUS

## 2021-10-30 MED ORDER — DEXAMETHASONE SODIUM PHOSPHATE 10 MG/ML IJ SOLN
INTRAMUSCULAR | Status: AC
  Filled 2021-10-30: qty 1

## 2021-10-30 MED ORDER — AMISULPRIDE (ANTIEMETIC) 5 MG/2ML IV SOLN: 10.0000 mg | Freq: Once | INTRAVENOUS | Status: DC | PRN

## 2021-10-30 MED ORDER — ASPIRIN EC 81 MG PO TBEC
81.0000 mg | DELAYED_RELEASE_TABLET | Freq: Two times a day (BID) | ORAL | 0 refills | Status: DC
Start: 2021-10-30 — End: 2022-06-21

## 2021-10-30 MED ORDER — ACETAMINOPHEN 500 MG PO TABS
1000.0000 mg | ORAL_TABLET | Freq: Once | ORAL | Status: AC
  Administered 2021-10-30: 1000 mg via ORAL

## 2021-10-30 MED ORDER — ROCURONIUM BROMIDE 10 MG/ML (PF) SYRINGE
PREFILLED_SYRINGE | INTRAVENOUS | Status: AC
  Filled 2021-10-30: qty 10

## 2021-10-30 MED ORDER — FENTANYL CITRATE (PF) 100 MCG/2ML IJ SOLN: 25.0000 ug | INTRAMUSCULAR | Status: DC | PRN

## 2021-10-30 MED ORDER — VANCOMYCIN HCL 500 MG IV SOLR
INTRAVENOUS | Status: DC | PRN
Start: 2021-10-30 — End: 2021-10-30
  Administered 2021-10-30: 500 mg via TOPICAL

## 2021-10-30 MED ORDER — APREPITANT 40 MG PO CAPS
ORAL_CAPSULE | ORAL | Status: AC
  Filled 2021-10-30: qty 1

## 2021-10-30 MED ORDER — ONDANSETRON HCL 4 MG/2ML IJ SOLN
INTRAMUSCULAR | Status: AC
  Filled 2021-10-30: qty 2

## 2021-10-30 MED ORDER — 0.9 % SODIUM CHLORIDE (POUR BTL) OPTIME
TOPICAL | Status: DC | PRN
  Administered 2021-10-30: 200 mL

## 2021-10-30 MED ORDER — MIDAZOLAM HCL 2 MG/2ML IJ SOLN
INTRAMUSCULAR | Status: AC
  Filled 2021-10-30: qty 2

## 2021-10-30 MED ORDER — SODIUM CHLORIDE 0.9 % IV SOLN: INTRAVENOUS | Status: DC

## 2021-10-30 SURGICAL SUPPLY — 85 items
ANCH SUT 2 2.9 2 LD TPR NDL (Anchor) ×4 IMPLANT
ANCH SUT 4.75 LNK KNTLS STRL (Anchor) ×4 IMPLANT
ANCHOR JUGGERKNOT WTAP NDL 2.9 (Anchor) ×2 IMPLANT
ANCHOR KNTLS VENTIX 4.75 (Anchor) ×2 IMPLANT
APL PRP STRL LF DISP 70% ISPRP (MISCELLANEOUS) ×2
BANDAGE ESMARK 6X9 LF (GAUZE/BANDAGES/DRESSINGS) ×2 IMPLANT
BIT DRILL JUGRKNT W/NDL BIT2.9 (DRILL) IMPLANT
BLADE AVERAGE 25X9 (BLADE) IMPLANT
BLADE MICRO SAGITTAL (BLADE) ×1 IMPLANT
BLADE SURG 15 STRL LF DISP TIS (BLADE) ×4 IMPLANT
BLADE SURG 15 STRL SS (BLADE) ×12
BNDG CMPR 9X6 STRL LF SNTH (GAUZE/BANDAGES/DRESSINGS) ×2
BNDG COHESIVE 4X5 TAN ST LF (GAUZE/BANDAGES/DRESSINGS) ×3 IMPLANT
BNDG COHESIVE 6X5 TAN ST LF (GAUZE/BANDAGES/DRESSINGS) ×2 IMPLANT
BNDG ELASTIC 4X5.8 VLCR STR LF (GAUZE/BANDAGES/DRESSINGS) ×1 IMPLANT
BNDG ELASTIC 6X5.8 VLCR STR LF (GAUZE/BANDAGES/DRESSINGS) ×1 IMPLANT
BNDG ESMARK 6X9 LF (GAUZE/BANDAGES/DRESSINGS) ×3
BOOT STEPPER DURA LG (SOFTGOODS) IMPLANT
BOOT STEPPER DURA MED (SOFTGOODS) IMPLANT
CANISTER SUCT 1200ML W/VALVE (MISCELLANEOUS) IMPLANT
CHLORAPREP W/TINT 26 (MISCELLANEOUS) ×3 IMPLANT
COVER BACK TABLE 60X90IN (DRAPES) ×3 IMPLANT
CUFF TOURN SGL QUICK 34 (TOURNIQUET CUFF) ×3
CUFF TRNQT CYL 34X4.125X (TOURNIQUET CUFF) ×2 IMPLANT
DRAPE EXTREMITY T 121X128X90 (DISPOSABLE) ×3 IMPLANT
DRAPE OEC MINIVIEW 54X84 (DRAPES) IMPLANT
DRAPE U-SHAPE 47X51 STRL (DRAPES) ×3 IMPLANT
DRILL JUGGERKNOT W/NDL BIT 2.9 (DRILL) ×3
DRSG MEPITEL 4X7.2 (GAUZE/BANDAGES/DRESSINGS) ×3 IMPLANT
DRSG PAD ABDOMINAL 8X10 ST (GAUZE/BANDAGES/DRESSINGS) ×6 IMPLANT
ELECT REM PT RETURN 9FT ADLT (ELECTROSURGICAL) ×3
ELECTRODE REM PT RTRN 9FT ADLT (ELECTROSURGICAL) ×2 IMPLANT
GAUZE SPONGE 4X4 12PLY STRL (GAUZE/BANDAGES/DRESSINGS) ×3 IMPLANT
GLOVE SRG 8 PF TXTR STRL LF DI (GLOVE) ×4 IMPLANT
GLOVE SURG ENC MOIS LTX SZ8 (GLOVE) ×3 IMPLANT
GLOVE SURG LTX SZ8 (GLOVE) ×3 IMPLANT
GLOVE SURG POLYISO LF SZ6.5 (GLOVE) ×1 IMPLANT
GLOVE SURG POLYISO LF SZ7 (GLOVE) ×2 IMPLANT
GLOVE SURG UNDER POLY LF SZ7 (GLOVE) ×3 IMPLANT
GLOVE SURG UNDER POLY LF SZ8 (GLOVE) ×6
GOWN STRL REUS W/ TWL LRG LVL3 (GOWN DISPOSABLE) ×2 IMPLANT
GOWN STRL REUS W/ TWL XL LVL3 (GOWN DISPOSABLE) ×4 IMPLANT
GOWN STRL REUS W/TWL LRG LVL3 (GOWN DISPOSABLE) ×6
GOWN STRL REUS W/TWL XL LVL3 (GOWN DISPOSABLE) ×6
NDL SAFETY ECLIPSE 18X1.5 (NEEDLE) IMPLANT
NDL SUT 6 .5 CRC .975X.05 MAYO (NEEDLE) IMPLANT
NEEDLE HYPO 18GX1.5 SHARP (NEEDLE)
NEEDLE HYPO 22GX1.5 SAFETY (NEEDLE) IMPLANT
NEEDLE MAYO TAPER (NEEDLE)
NS IRRIG 1000ML POUR BTL (IV SOLUTION) ×3 IMPLANT
PACK BASIN DAY SURGERY FS (CUSTOM PROCEDURE TRAY) ×3 IMPLANT
PAD CAST 4YDX4 CTTN HI CHSV (CAST SUPPLIES) ×2 IMPLANT
PADDING CAST COTTON 4X4 STRL (CAST SUPPLIES) ×3
PADDING CAST COTTON 6X4 STRL (CAST SUPPLIES) ×3 IMPLANT
PENCIL SMOKE EVACUATOR (MISCELLANEOUS) ×3 IMPLANT
SANITIZER HAND PURELL 535ML FO (MISCELLANEOUS) ×3 IMPLANT
SHEET MEDIUM DRAPE 40X70 STRL (DRAPES) ×3 IMPLANT
SLEEVE SCD COMPRESS KNEE MED (STOCKING) ×3 IMPLANT
SPLINT FAST PLASTER 5X30 (CAST SUPPLIES) ×20
SPLINT PLASTER CAST FAST 5X30 (CAST SUPPLIES) ×40 IMPLANT
SPONGE T-LAP 18X18 ~~LOC~~+RFID (SPONGE) ×3 IMPLANT
STAPLER VISISTAT 35W (STAPLE) IMPLANT
STOCKINETTE 6  STRL (DRAPES) ×3
STOCKINETTE 6 STRL (DRAPES) ×2 IMPLANT
SUCTION FRAZIER HANDLE 10FR (MISCELLANEOUS) ×3
SUCTION TUBE FRAZIER 10FR DISP (MISCELLANEOUS) ×2 IMPLANT
SUT ETHIBOND 2 OS 4 DA (SUTURE) IMPLANT
SUT ETHIBOND 3-0 V-5 (SUTURE) IMPLANT
SUT ETHILON 3 0 PS 1 (SUTURE) ×4 IMPLANT
SUT FIBERWIRE #2 38 T-5 BLUE (SUTURE)
SUT MNCRL AB 3-0 PS2 18 (SUTURE) ×3 IMPLANT
SUT VIC AB 0 CT1 27 (SUTURE)
SUT VIC AB 0 CT1 27XBRD ANBCTR (SUTURE) IMPLANT
SUT VIC AB 1 CT1 27 (SUTURE)
SUT VIC AB 1 CT1 27XBRD ANBCTR (SUTURE) IMPLANT
SUT VIC AB 2-0 SH 18 (SUTURE) IMPLANT
SUT VIC AB 2-0 SH 27 (SUTURE)
SUT VIC AB 2-0 SH 27XBRD (SUTURE) IMPLANT
SUT VICRYL 0 SH 27 (SUTURE) ×3 IMPLANT
SUTURE FIBERWR #2 38 T-5 BLUE (SUTURE) IMPLANT
SYR BULB EAR ULCER 3OZ GRN STR (SYRINGE) ×3 IMPLANT
TOWEL GREEN STERILE FF (TOWEL DISPOSABLE) ×6 IMPLANT
TUBE CONNECTING 20X1/4 (TUBING) ×3 IMPLANT
UNDERPAD 30X36 HEAVY ABSORB (UNDERPADS AND DIAPERS) ×3 IMPLANT
YANKAUER SUCT BULB TIP NO VENT (SUCTIONS) IMPLANT

## 2021-10-30 NOTE — Anesthesia Postprocedure Evaluation (Signed)
Anesthesia Post Note ? ?Patient: Kathryn Cummings ? ?Procedure(s) Performed: Right gastroc recession (Right: Leg Lower) ?Excision of haglund deformity, right Achilles tendon reconstruction (Right: Foot) ? ?  ? ?Patient location during evaluation: PACU ?Anesthesia Type: Regional and General ?Level of consciousness: awake and alert ?Pain management: pain level controlled ?Vital Signs Assessment: post-procedure vital signs reviewed and stable ?Respiratory status: spontaneous breathing, nonlabored ventilation, respiratory function stable and patient connected to nasal cannula oxygen ?Cardiovascular status: blood pressure returned to baseline and stable ?Postop Assessment: no apparent nausea or vomiting ?Anesthetic complications: no ? ? ?No notable events documented. ? ?Last Vitals:  ?Vitals:  ? 10/30/21 0930 10/30/21 0955  ?BP: 128/76 (!) 143/77  ?Pulse: 66 77  ?Resp: 12 16  ?Temp:  36.6 ?C  ?SpO2: 97% 97%  ?  ?Last Pain:  ?Vitals:  ? 10/30/21 0642  ?TempSrc: Oral  ?PainSc: 0-No pain  ? ? ?  ?  ?  ?  ?  ?  ? ?[name] [name] ? ? ? ? ?

## 2021-10-30 NOTE — Op Note (Signed)
10/30/2021 ? ?8:45 AM ? ?PATIENT:  Kathryn Cummings  66 y.o. female ? ?PRE-OPERATIVE DIAGNOSIS: 1.  Right Achilles insertional tendinopathy ?2.  Short right Achilles tendon ?3.  Right calcaneus Haglund deformity ? ?POST-OPERATIVE DIAGNOSIS: Same ? ?Procedure(s): ?1.  Right gastrocnemius recession through a separate incision ?2.  Excision of right calcaneus haglund deformity ?3.  Right Achilles tendon debridement and reconstruction ? ?SURGEON:  Wylene Simmer, MD ? ?ASSISTANT: Mechele Claude, PA-C ? ?ANESTHESIA:   General, regional ? ?EBL:  minimal  ? ?TOURNIQUET:   ?Total Tourniquet Time Documented: ?Thigh (Right) - 51 minutes ?Total: Thigh (Right) - 51 minutes ? ?COMPLICATIONS:  None apparent ? ?DISPOSITION:  Extubated, awake and stable to recovery. ? ?INDICATION FOR PROCEDURE: The patient is a 66 year old female with a long history of right heel pain due to Achilles tendinitis.  She also has a prominent Haglund deformity and short Achilles tendon.  She has failed nonoperative treatment to date including activity modification, oral anti-inflammatories, shoewear modification and physical therapy.  She presents now for surgical treatment of this painful and limiting condition.  The risks and benefits of the alternative treatment options have been discussed in detail.  The patient wishes to proceed with surgery and specifically understands risks of bleeding, infection, nerve damage, blood clots, need for additional surgery, amputation and death.  ? ?PROCEDURE IN DETAIL: After preoperative consent was obtained and the correct operative site was identified, the patient was brought to the operating room supine on the stretcher.  General anesthesia was induced.  Preoperative antibiotics were administered.  A surgical timeout was taken.  The right lower extremity was exsanguinated, and a thigh tourniquet inflated to 250 mmHg.  The patient was then turned in the prone position on the operating table with all bony prominences padded  well.  The right lower extremity was prepped and draped in standard sterile fashion.  A longitudinal incision was made at the calf.  Dissection was carried sharply down through the subcutaneous tissues.  The sural nerve was protected.  The gastrocnemius tendon was divided under direct vision.  The wound was irrigated and closed with Monocryl and nylon after sprinkling with vancomycin powder. ? ?Attention was turned to the posterior heel.  A longitudinal incision was made and dissection carried sharply down through the subcutaneous tissues and peritenon.  The Achilles tendon was then split longitudinally and released from its insertion medially and laterally at the calcaneus.  The tendon was debrided of all calcified portions as well as degenerated tissue.  The oscillating saw was used to remove the insertional enthesophyte and prominent Haglund deformity.  The tendon was then repaired to the cut surface of bone with 2 juggernaut and 2 Ventix anchors with an hourglass pattern of nonabsorbable suture.  The ankle could be dorsiflexed to neutral without any gapping at the tendon repair site.  The wound was irrigated copiously and sprinkled with vancomycin powder.  The longitudinal split in the tendon was repaired with imbricating sutures of 0 Vicryl.  The subcutaneous tissues were approximated with Vicryl and Monocryl.  The skin incision was closed with running 3-0 nylon.  Sterile dressings were applied followed by a well-padded short leg splint with the ankle in gravity equinus.  The patient was awakened from anesthesia and transported to the recovery room in stable condition.  The tourniquet was released after application of the dressings. ? ?FOLLOW UP PLAN: Nonweightbearing on the right lower extremity.  Follow-up in the office in 2 weeks for suture removal and conversion to a  cam boot with 2 or 3 heel lifts.  Aspirin for DVT prophylaxis. ? ? ?RADIOGRAPHS: ? ? ? Mechele Claude PA-C was present and scrubbed for the  duration of the operative case. His assistance was essential in positioning the patient, prepping and draping, gaining and maintaining exposure, performing the operation, closing and dressing the wounds and applying the splint. ? ?  ?

## 2021-10-30 NOTE — Discharge Instructions (Addendum)
Wylene Simmer, MD ?Ros[facility]ting ? ?Please read the following information regarding your care after surgery. ? ?Medications  ?You only need a prescription for the narcotic pain medicine (ex. oxycodone, Percocet, Norco).  All of the other medicines listed below are available over the counter. ?? Aleve 2 pills twice a day for the first 3 days after surgery. ?? acetominophen (Tylenol) 650 mg every 4-6 hours as you need for minor to moderate pain ?? oxycodone as prescribed for severe pain ? ?Narcotic pain medicine (ex. oxycodone, Percocet, Vicodin) will cause constipation.  To prevent this problem, take the following medicines while you are taking any pain medicine. ?? docusate sodium (Colace) 100 mg twice a day ? senna (Senokot) 2 tablets twice a day ? ?? To help prevent blood clots, take a baby aspirin (81 mg) twice a day for two weeks after surgery.  You should also get up every hour while you are awake to move around.   ? ?Weight Bearing ?? Do not bear any weight on the operated leg or foot. ? ?Cast / Splint / Dressing ?? Keep your splint, cast or dressing clean and dry.  Don?t put anything (coat hanger, pencil, etc) down inside of it.  If it gets damp, use a hair dryer on the cool setting to dry it.  If it gets soaked, call the office to schedule an appointment for a cast change. ? ? ?After your dressing, cast or splint is removed; you may shower, but do not soak or scrub the wound.  Allow the water to run over it, and then gently pat it dry. ? ?Swelling ?It is normal for you to have swelling where you had surgery.  To reduce swelling and pain, keep your toes above your nose for at least 3 days after surgery.  It may be necessary to keep your foot or leg elevated for several weeks.  If it hurts, it should be elevated. ? ?Follow Up ?Call my office at [phone] when you are discharged from the hospital or surgery center to schedule an appointment to be seen two weeks after surgery. ? ?Call my office at [phone] if  you develop a fever >101.5? F, nausea, vomiting, bleeding from the surgical site or severe pain.   ? ?No tylenol until after 12:45pm today, if needed. No ibuprofen/motrin until after 2:45pm, if needed. ? ?Post Anesthesia Home Care Instructions ? ?Activity: ?Get plenty of rest for the remainder of the day. A responsible individual must stay with you for 24 hours following the procedure.  ?For the next 24 hours, DO NOT: ?-Drive a car ?-Paediatric nurse ?-Drink alcoholic beverages ?-Take any medication unless instructed by your physician ?-Make any legal decisions or sign important papers. ? ?Meals: ?Start with liquid foods such as gelatin or soup. Progress to regular foods as tolerated. Avoid greasy, spicy, heavy foods. If nausea and/or vomiting occur, drink only clear liquids until the nausea and/or vomiting subsides. Call your physician if vomiting continues. ? ?Special Instructions/Symptoms: ?Your throat may feel dry or sore from the anesthesia or the breathing tube placed in your throat during surgery. If this causes discomfort, gargle with warm salt water. The discomfort should disappear within 24 hours. ? ?If you had a scopolamine patch placed behind your ear for the management of post- operative nausea and/or vomiting: ? ?1. The medication in the patch is effective for 72 hours, after which it should be removed.  Wrap patch in a tissue and discard in the trash. Wash hands thoroughly with soap  and water. ?2. You may remove the patch earlier than 72 hours if you experience unpleasant side effects which may include dry mouth, dizziness or visual disturbances. ?3. Avoid touching the patch. Wash your hands with soap and water after contact with the patch. ?   Regional Anesthesia Blocks ? ?1. Numbness or the inability to move the "blocked" extremity may last from 3-48 hours after placement. The length of time depends on the medication injected and your individual response to the medication. If the numbness is not  going away after 48 hours, call your surgeon. ? ?2. The extremity that is blocked will need to be protected until the numbness is gone and the  Strength has returned. Because you cannot feel it, you will need to take extra care to avoid injury. Because it may be weak, you may have difficulty moving it or using it. You may not know what position it is in without looking at it while the block is in effect. ? ?3. For blocks in the legs and feet, returning to weight bearing and walking needs to be done carefully. You will need to wait until the numbness is entirely gone and the strength has returned. You should be able to move your leg and foot normally before you try and bear weight or walk. You will need someone to be with you when you first try to ensure you do not fall and possibly risk injury. ? ?4. Bruising and tenderness at the needle site are common side effects and will resolve in a few days. ? ?5. Persistent numbness or new problems with movement should be communicated to the surgeon or the [employer] [phone] [place] [phone]). Information for Discharge Teaching: ?EXPAREL (bupivacaine liposome injectable suspension)  ? ?Your surgeon or anesthesiologist gave you EXPAREL(bupivacaine) to help control your pain after surgery.  ?EXPAREL is a local anesthetic that provides pain relief by numbing the tissue around the surgical site. ?EXPAREL is designed to release pain medication over time and can control pain for up to 72 hours. ?Depending on how you respond to EXPAREL, you may require less pain medication during your recovery. ? ?Possible side effects: ?Temporary loss of sensation or ability to move in the area where bupivacaine was injected. ?Nausea, vomiting, constipation ?Rarely, numbness and tingling in your mouth or lips, lightheadedness, or anxiety may occur. ?Call your doctor right away if you think you may be experiencing any of these sensations, or if you have  other questions regarding possible side effects. ? ?Follow all other discharge instructions given to you by your surgeon or nurse. Eat a healthy diet and drink plenty of water or other fluids. ? ?If you return to the hospital for any reason within 96 hours following the administration of EXPAREL, it is important for health care providers to know that you have received this anesthetic. A teal colored band has been placed on your arm with the date, time and amount of EXPAREL you have received in order to alert and inform your health care providers. Please leave this armband in place for the full 96 hours following administration, and then you may remove the band.  ?  ?

## 2021-10-30 NOTE — Anesthesia Procedure Notes (Signed)
Procedure Name: Intubation ?Date/Time: 10/30/2021 7:35 AM ?Performed by: Glory Buff, CRNA ?Pre-anesthesia Checklist: Patient identified, Emergency Drugs available, Suction available and Patient being monitored ?Patient Re-evaluated:Patient Re-evaluated prior to induction ?Oxygen Delivery Method: Circle system utilized ?Preoxygenation: Pre-oxygenation with 100% oxygen ?Induction Type: IV induction ?Ventilation: Mask ventilation without difficulty ?Laryngoscope Size: Sabra Heck and 3 ?Grade View: Grade I ?Tube type: Oral ?Tube size: 7.0 mm ?Number of attempts: 1 ?Airway Equipment and Method: Stylet and Oral airway ?Placement Confirmation: ETT inserted through vocal cords under direct vision, positive ETCO2 and breath sounds checked- equal and bilateral ?Secured at: 20 cm ?Tube secured with: Tape ?Dental Injury: Teeth and Oropharynx as per pre-operative assessment  ? ? ? ? ?

## 2021-10-30 NOTE — H&P (Signed)
Kathryn Cummings is an 66 y.o. female.   ?Chief Complaint:  right heel pain ?HPI: 66 y/o female without significant PMH has a long h/o right heel pain due to achilles tendonitis.  She has a prominent enthesophyte and a tight heel cord as well.  She presents now for surgical treatment having failed non op treatment to date. ? ?Past Medical History:  ?Diagnosis Date  ? Anxiety   ? Arthritis   ? Basal cell adenocarcinoma   ? Diverticulosis   ? Elevated cholesterol   ? GERD without esophagitis   ? Heartburn   ? History of chicken pox   ? Insomnia   ? Migraine headache   ? Osteoarthritis   ? Lumbar spine; hands; feet  ? PONV (postoperative nausea and vomiting)   ? UTI (lower urinary tract infection)   ? ? ?Past Surgical History:  ?Procedure Laterality Date  ? ABDOMINAL HYSTERECTOMY  1988  ? APPENDECTOMY  1988  ? BASAL CELL CARCINOMA EXCISION    ? Multiple  ? BLADDER REPAIR  2000  ? Mesh  ? BREAST BIOPSY  1990  ? CHOLECYSTECTOMY    ? ? ?Family History  ?Problem Relation Age of Onset  ? Arthritis-Osteo Mother   ?     Living  ? Diabetes Mother   ? Hyperlipidemia Mother   ? Hypertension Mother   ? Arthritis-Osteo Father   ?     Living  ? Diabetes Father   ? Hypertension Father   ? Healthy Sister   ?     x1  ? Healthy Brother   ?     x1  ? Breast cancer Maternal Aunt   ? Heart attack Maternal Grandmother   ? Alzheimer's disease Maternal Grandmother   ? Heart attack Maternal Grandfather   ? Dementia Paternal Grandmother   ? Pancreatic cancer Paternal Grandfather   ? Healthy Son   ?     x2  ? Colon cancer Neg Hx   ? Colon polyps Neg Hx   ? Liver disease Neg Hx   ? Esophageal cancer Neg Hx   ? Stomach cancer Neg Hx   ? Rectal cancer Neg Hx   ? ?Social History:  reports that she has never smoked. She has never used smokeless tobacco. She reports current alcohol use of about 2.0 standard drinks per week. She reports that she does not use drugs. ? ?Allergies:  ?Allergies  ?Allergen Reactions  ? Other Rash  ?  Bactrium   ? ? ?Medications  Prior to Admission  ?Medication Sig Dispense Refill  ? ALPRAZolam (XANAX) 0.25 MG tablet Take 0.5-1 tablets (0.125-0.25 mg total) by mouth at bedtime as needed for anxiety. 30 tablet 1  ? atorvastatin (LIPITOR) 40 MG tablet TAKE 1 TABLET BY MOUTH EVERY DAY 90 tablet 1  ? azithromycin (ZITHROMAX) 250 MG tablet Take 500 mg once, then 250 mg for four days 6 tablet 0  ? cetirizine (ZYRTEC) 10 MG chewable tablet Chew 10 mg by mouth daily as needed for allergies.    ? FLUoxetine (PROZAC) 20 MG capsule TAKE 1 CAPSULE BY MOUTH EVERY DAY 90 capsule 1  ? Nutritional Supplements (JUICE PLUS FIBRE) LIQD Take by mouth daily.    ? omeprazole (PRILOSEC) 40 MG capsule TAKE 1 CAPSULE (40 MG TOTAL) BY MOUTH DAILY. 90 capsule 0  ? SUMAtriptan (IMITREX) 100 MG tablet TAKE 1 TABLET BY MOUTH AS NEEDED FOR HEADACHE. MAY REPEAT IN 2 HOURS IF HEADACHE PERSISTS OR RECURS 10 tablet 3  ? ? ?  No results found for this or any previous visit (from the past 48 hour(s)). ?No results found. ? ?Review of Systems  no recent f/c/n/v/wt loss. ? ?Blood pressure 132/73, pulse 82, temperature (!) 97 ?F (36.1 ?C), temperature source Oral, resp. rate 12, height '5\' 3"'$  (1.6 m), weight 79 kg, SpO2 97 %. ?Physical Exam  ?70 wd woman in nad.  A and O x 4.  Normal mood and affect.  EOMI.  Resp unlabored.  R heel TTP.  Heelcord is tight.  Skin is healthy and intact.  No lymphadenopathy. ? ?Assessment/Plan ?R achilles tendonitis, short achilles and Haglund deformity.  To the OR today for surgical treatment.  The risks and benefits of the alternative treatment options have been discussed in detail.  The patient wishes to proceed with surgery and specifically understands risks of bleeding, infection, nerve damage, blood clots, need for additional surgery, amputation and death.  ? ?Wylene Simmer, MD ?11-06-21, 7:24 AM ? ? ? ?

## 2021-10-30 NOTE — Transfer of Care (Signed)
Immediate Anesthesia Transfer of Care Note ? ?Patient: Kathryn Cummings ? ?Procedure(s) Performed: Right gastroc recession (Right: Leg Lower) ?Excision of haglund deformity, right Achilles tendon reconstruction (Right: Foot) ? ?Patient Location: PACU ? ?Anesthesia Type:General ? ?Level of Consciousness: drowsy, patient cooperative and responds to stimulation ? ?Airway & Oxygen Therapy: Patient Spontanous Breathing and Patient connected to face mask oxygen ? ?Post-op Assessment: Report given to RN and Post -op Vital signs reviewed and stable ? ?Post vital signs: Reviewed and stable ? ?Last Vitals:  ?Vitals Value Taken Time  ?BP    ?Temp    ?Pulse 95 10/30/21 0844  ?Resp    ?SpO2 99 % 10/30/21 0844  ?Vitals shown include unvalidated device data. ? ?Last Pain:  ?Vitals:  ? 10/30/21 0642  ?TempSrc: Oral  ?PainSc: 0-No pain  ?   ? ?  ? ?Complications: No notable events documented. ?

## 2021-10-30 NOTE — Anesthesia Procedure Notes (Signed)
Anesthesia Regional Block: Adductor canal block  ? ?Pre-Anesthetic Checklist: , timeout performed,  Correct Patient, Correct Site, Correct Laterality,  Correct Procedure, Correct Position, site marked,  Risks and benefits discussed,  Surgical consent,  Pre-op evaluation,  At surgeon's request and post-op pain management ? ?Laterality: Right ? ?Prep: Dura Prep     ?  ?Needles:  ?Injection technique: Single-shot ? ?Needle Type: Echogenic Stimulator Needle   ? ? ?Needle Length: 10cm  ?Needle Gauge: 20  ? ? ? ?Additional Needles: ? ? ?Procedures:,,,, ultrasound used (permanent image in chart),,    ?Narrative:  ?Start time: 10/30/2021 7:07 AM ?End time: 10/30/2021 7:09 AM ?Injection made incrementally with aspirations every 5 mL. ? ?Performed by: Personally  ?Anesthesiologist: Darral Dash, DO ? ?Additional Notes: ?Patient identified. Risks/Benefits/Options discussed with patient including but not limited to bleeding, infection, nerve damage, failed block, incomplete pain control. Patient expressed understanding and wished to proceed. All questions were answered. Sterile technique was used throughout the entire procedure. Please see nursing notes for vital signs. Aspirated in 5cc intervals with injection for negative confirmation. Patient was given instructions on fall risk and not to get out of bed. All questions and concerns addressed with instructions to call with any issues or inadequate analgesia.   ?  ? ? ? ? ?

## 2021-10-30 NOTE — Anesthesia Procedure Notes (Signed)
Anesthesia Regional Block: Popliteal block  ? ?Pre-Anesthetic Checklist: , timeout performed,  Correct Patient, Correct Site, Correct Laterality,  Correct Procedure, Correct Position, site marked,  Risks and benefits discussed,  Surgical consent,  Pre-op evaluation,  At surgeon's request and post-op pain management ? ?Laterality: Right ? ?Prep: Dura Prep     ?  ?Needles:  ?Injection technique: Single-shot ? ?Needle Type: Echogenic Stimulator Needle   ? ? ?Needle Length: 10cm  ?Needle Gauge: 20  ? ? ? ?Additional Needles: ? ? ?Procedures:,,,, ultrasound used (permanent image in chart),,    ?Narrative:  ?Start time: 10/30/2021 7:10 AM ?End time: 10/30/2021 7:13 AM ?Injection made incrementally with aspirations every 5 mL. ? ?Performed by: Personally  ?Anesthesiologist: Darral Dash, DO ? ?Additional Notes: ?Patient identified. Risks/Benefits/Options discussed with patient including but not limited to bleeding, infection, nerve damage, failed block, incomplete pain control. Patient expressed understanding and wished to proceed. All questions were answered. Sterile technique was used throughout the entire procedure. Please see nursing notes for vital signs. Aspirated in 5cc intervals with injection for negative confirmation. Patient was given instructions on fall risk and not to get out of bed. All questions and concerns addressed with instructions to call with any issues or inadequate analgesia.   ?  ? ? ? ? ?

## 2021-10-30 NOTE — Progress Notes (Signed)
Assisted Dr. Greg Stoltzfus with right, ultrasound guided, popliteal, adductor canal block. Side rails up, monitors on throughout procedure. See vital signs in flow sheet. Tolerated Procedure well. 

## 2021-10-31 ENCOUNTER — Encounter (HOSPITAL_BASED_OUTPATIENT_CLINIC_OR_DEPARTMENT_OTHER): Payer: Self-pay | Admitting: Orthopedic Surgery

## 2022-01-06 ENCOUNTER — Other Ambulatory Visit: Payer: Self-pay | Admitting: Family Medicine

## 2022-01-06 ENCOUNTER — Telehealth: Payer: Medicare Other | Admitting: Nurse Practitioner

## 2022-01-06 DIAGNOSIS — H00022 Hordeolum internum right lower eyelid: Secondary | ICD-10-CM | POA: Diagnosis not present

## 2022-01-06 DIAGNOSIS — K219 Gastro-esophageal reflux disease without esophagitis: Secondary | ICD-10-CM

## 2022-01-06 MED ORDER — NEOMYCIN-POLYMYXIN-HC 3.5-10000-1 OP SUSP: 3.0000 [drp] | Freq: Four times a day (QID) | OPHTHALMIC | 0 refills | Status: AC

## 2022-01-06 MED ORDER — NEOMYCIN-POLYMYXIN-HC 3.5-10000-1 OT SOLN: 3.0000 [drp] | OTIC | 0 refills | Status: DC

## 2022-01-06 NOTE — Addendum Note (Signed)
Addended by: Apolonio Schneiders E on: 01/06/2022 01:00 PM   Modules accepted: Orders

## 2022-01-06 NOTE — Progress Notes (Signed)
E-Visit for Stye   We are sorry that you are not feeling well. Here is how we plan to help!  You can switch from the ointment to drops to try and reach the bottom of the eye more. Continue warm compresses as well:  Based on what you have shared with me it looks like you have a stye.  A stye is an inflammation of the eyelid.  It is often a red, painful lump near the edge of the eyelid that may look like a boil or a pimple.  A stye develops when an infection occurs at the base of an eyelash.   We have made appropriate suggestions for you based upon your presentation: Your symptoms may indicate an infection of the sclera.  The use of anti-inflammatory and antibiotic eye drops for a week will help resolve this condition.  I have sent in neomycin-polymyxin HC opthalmic suspension, two to three drops in the affected eye every 4 hours.  If your symptoms do not improve over the next two to three days you should be seen in your doctor's office.  HOME CARE:  Wash your hands often! Let the stye open on its own. Don't squeeze or open it. Don't rub your eyes. This can irritate your eyes and let in bacteria.  If you need to touch your eyes, wash your hands first. Don't wear eye makeup or contact lenses until the area has healed.  GET HELP RIGHT AWAY IF:  Your symptoms do not improve. You develop blurred or loss of vision. Your symptoms worsen (increased discharge, pain or redness).   Thank you for choosing an e-visit.  Your e-visit answers were reviewed by a board certified advanced clinical practitioner to complete your personal care plan. Depending upon the condition, your plan could have included both over the counter or prescription medications.  Please review your pharmacy choice. Make sure the pharmacy is open so you can pick up prescription now. If there is a problem, you may contact your provider through [employer] and have the prescription routed to another pharmacy.  Your safety is  important to [place]. If you have drug allergies check your prescription carefully.   For the next 24 hours you can use MyChart to ask questions about today's visit, request a non-urgent call back, or ask for a work or school excuse. You will get an email in the next two days asking about your experience. I hope that your e-visit has been valuable and will speed your recovery.   Meds ordered this encounter  Medications   neomycin-polymyxin-hydrocortisone (CORTISPORIN) OTIC solution    Sig: Place 3 drops into the right ear every 4 (four) hours while awake for 5 days.    Dispense:  3.8 mL    Refill:  0     I spent approximately 5 minutes reviewing the patient's history, current symptoms and coordinating their plan of care today.

## 2022-02-02 ENCOUNTER — Other Ambulatory Visit: Payer: Self-pay | Admitting: Family Medicine

## 2022-02-02 DIAGNOSIS — F411 Generalized anxiety disorder: Secondary | ICD-10-CM

## 2022-04-27 ENCOUNTER — Other Ambulatory Visit: Payer: Self-pay | Admitting: Family Medicine

## 2022-04-27 DIAGNOSIS — K219 Gastro-esophageal reflux disease without esophagitis: Secondary | ICD-10-CM

## 2022-04-29 ENCOUNTER — Ambulatory Visit (INDEPENDENT_AMBULATORY_CARE_PROVIDER_SITE_OTHER): Payer: Medicare Other

## 2022-04-29 ENCOUNTER — Ambulatory Visit: Payer: Medicare Other | Admitting: Family Medicine

## 2022-04-29 VITALS — Ht 63.0 in | Wt 173.0 lb

## 2022-04-29 DIAGNOSIS — Z0000 Encounter for general adult medical examination without abnormal findings: Secondary | ICD-10-CM

## 2022-04-29 NOTE — Patient Instructions (Addendum)
Kathryn Cummings , Thank you for taking time to come for your Medicare Wellness Visit. I appreciate your ongoing commitment to your health goals. Please review the following plan we discussed and let me know if I can assist you in the future.   Screening recommendations/referrals: Colonoscopy: Done 09/17/21 Repeat 7 Yrs Mammogram: Deferred Bone Density: Deferred Recommended yearly ophthalmology/optometry visit for glaucoma screening and checkup Recommended yearly dental visit for hygiene and checkup  Vaccinations: Influenza vaccine: Deferred Pneumococcal vaccine: Deferred Tdap vaccine: Up to dtae Shingles vaccine:Done   Covid-19:Deferred  Advanced directives: Please bring a copy of your health care power of attorney and living will to the office to be added to your chart at your convenience.   Conditions/risks identified: None  Next appointment: Follow up in one year for your annual wellness visit      Preventive Care 65 Years and Older, Female Preventive care refers to lifestyle choices and visits with your health care provider that can promote health and wellness. What does preventive care include? A yearly physical exam. This is also called an annual well check. Dental exams once or twice a year. Routine eye exams. Ask your health care provider how often you should have your eyes checked. Personal lifestyle choices, including: Daily care of your teeth and gums. Regular physical activity. Eating a healthy diet. Avoiding tobacco and drug use. Limiting alcohol use. Practicing safe sex. Taking low-dose aspirin every day. Taking vitamin and mineral supplements as recommended by your health care provider. What happens during an annual well check? The services and screenings done by your health care provider during your annual well check will depend on your age, overall health, lifestyle risk factors, and family history of disease. Counseling  Your health care provider may ask you  questions about your: Alcohol use. Tobacco use. Drug use. Emotional well-being. Home and relationship well-being. Sexual activity. Eating habits. History of falls. Memory and ability to understand (cognition). Work and work Statistician. Reproductive health. Screening  You may have the following tests or measurements: Height, weight, and BMI. Blood pressure. Lipid and cholesterol levels. These may be checked every 5 years, or more frequently if you are over 44 years old. Skin check. Lung cancer screening. You may have this screening every year starting at age 53 if you have a 30-pack-year history of smoking and currently smoke or have quit within the past 15 years. Fecal occult blood test (FOBT) of the stool. You may have this test every year starting at age 26. Flexible sigmoidoscopy or colonoscopy. You may have a sigmoidoscopy every 5 years or a colonoscopy every 10 years starting at age 13. Hepatitis C blood test. Hepatitis B blood test. Sexually transmitted disease (STD) testing. Diabetes screening. This is done by checking your blood sugar (glucose) after you have not eaten for a while (fasting). You may have this done every 1-3 years. Bone density scan. This is done to screen for osteoporosis. You may have this done starting at age 61. Mammogram. This may be done every 1-2 years. Talk to your health care provider about how often you should have regular mammograms. Talk with your health care provider about your test results, treatment options, and if necessary, the need for more tests. Vaccines  Your health care provider may recommend certain vaccines, such as: Influenza vaccine. This is recommended every year. Tetanus, diphtheria, and acellular pertussis (Tdap, Td) vaccine. You may need a Td booster every 10 years. Zoster vaccine. You may need this after age 57. Pneumococcal 13-valent  conjugate (PCV13) vaccine. One dose is recommended after age 43. Pneumococcal polysaccharide  (PPSV23) vaccine. One dose is recommended after age 17. Talk to your health care provider about which screenings and vaccines you need and how often you need them. This information is not intended to replace advice given to you by your health care provider. Make sure you discuss any questions you have with your health care provider. Document Released: 08/30/2015 Document Revised: 04/22/2016 Document Reviewed: 06/04/2015 Elsevier Interactive Patient Education  2017 [employer] Prevention in the Home Falls can cause injuries. They can happen to people of all ages. There are many things you can do to make your home safe and to help prevent falls. What can I do on the outside of my home? Regularly fix the edges of walkways and driveways and fix any cracks. Remove anything that might make you trip as you walk through a door, such as a raised step or threshold. Trim any bushes or trees on the path to your home. Use bright outdoor lighting. Clear any walking paths of anything that might make someone trip, such as rocks or tools. Regularly check to see if handrails are loose or broken. Make sure that both sides of any steps have handrails. Any raised decks and porches should have guardrails on the edges. Have any leaves, snow, or ice cleared regularly. Use sand or salt on walking paths during winter. Clean up any spills in your garage right away. This includes oil or grease spills. What can I do in the bathroom? Use night lights. Install grab bars by the toilet and in the tub and shower. Do not use towel bars as grab bars. Use non-skid mats or decals in the tub or shower. If you need to sit down in the shower, use a plastic, non-slip stool. Keep the floor dry. Clean up any water that spills on the floor as soon as it happens. Remove soap buildup in the tub or shower regularly. Attach bath mats securely with double-sided non-slip rug tape. Do not have throw rugs and other things on the  floor that can make you trip. What can I do in the bedroom? Use night lights. Make sure that you have a light by your bed that is easy to reach. Do not use any sheets or blankets that are too big for your bed. They should not hang down onto the floor. Have a firm chair that has side arms. You can use this for support while you get dressed. Do not have throw rugs and other things on the floor that can make you trip. What can I do in the kitchen? Clean up any spills right away. Avoid walking on wet floors. Keep items that you use a lot in easy-to-reach places. If you need to reach something above you, use a strong step stool that has a grab bar. Keep electrical cords out of the way. Do not use floor polish or wax that makes floors slippery. If you must use wax, use non-skid floor wax. Do not have throw rugs and other things on the floor that can make you trip. What can I do with my stairs? Do not leave any items on the stairs. Make sure that there are handrails on both sides of the stairs and use them. Fix handrails that are broken or loose. Make sure that handrails are as long as the stairways. Check any carpeting to make sure that it is firmly attached to the stairs. Fix any carpet that  is loose or worn. Avoid having throw rugs at the top or bottom of the stairs. If you do have throw rugs, attach them to the floor with carpet tape. Make sure that you have a light switch at the top of the stairs and the bottom of the stairs. If you do not have them, ask someone to add them for you. What else can I do to help prevent falls? Wear shoes that: Do not have high heels. Have rubber bottoms. Are comfortable and fit you well. Are closed at the toe. Do not wear sandals. If you use a stepladder: Make sure that it is fully opened. Do not climb a closed stepladder. Make sure that both sides of the stepladder are locked into place. Ask someone to hold it for you, if possible. Clearly mark and make  sure that you can see: Any grab bars or handrails. First and last steps. Where the edge of each step is. Use tools that help you move around (mobility aids) if they are needed. These include: Canes. Walkers. Scooters. Crutches. Turn on the lights when you go into a dark area. Replace any light bulbs as soon as they burn out. Set up your furniture so you have a clear path. Avoid moving your furniture around. If any of your floors are uneven, fix them. If there are any pets around you, be aware of where they are. Review your medicines with your doctor. Some medicines can make you feel dizzy. This can increase your chance of falling. Ask your doctor what other things that you can do to help prevent falls. This information is not intended to replace advice given to you by your health care provider. Make sure you discuss any questions you have with your health care provider. Document Released: 05/30/2009 Document Revised: 01/09/2016 Document Reviewed: 09/07/2014 Elsevier Interactive Patient Education  2017 [employer].

## 2022-04-29 NOTE — Progress Notes (Signed)
Subjective:   Kathryn Cummings is a 66 y.o. female who presents for Medicare Annual (Subsequent) preventive examination.  Review of Systems    Virtual Visit via Telephone Note  I connected with  [name] on 04/29/22 at  1:30 PM EDT by telephone and verified that I am speaking with the correct person using two identifiers.  Location: Patient: Home Provider: Office Persons participating in the virtual visit: patient/Nurse Health Advisor   I discussed the limitations, risks, security and privacy concerns of performing an evaluation and management service by telephone and the availability of in person appointments. The patient expressed understanding and agreed to proceed.  Interactive audio and video telecommunications were attempted between this nurse and patient, however failed, due to patient having technical difficulties OR patient did not have access to video capability.  We continued and completed visit with audio only.  Some vital signs may be absent or patient reported.   [name], LPN  Cardiac Risk Factors include: advanced age (>54mn, >>58women)     Objective:    Today's Vitals   04/29/22 1332  Weight: 173 lb (78.5 kg)  Height: '5\' 3"'$  (1.6 m)   Body mass index is 30.65 kg/m.     04/29/2022    1:41 PM 10/30/2021    6:38 AM  Advanced Directives  Does Patient Have a Medical Advance Directive? Yes No;Yes  Type of AParamed[place]of [place] will [place] will  Does patient want to make changes to medical advance directive?  No - Patient declined  Copy of HA[place]onin Chart? No - copy requested No - copy requested    Current Medications (verified) Outpatient Encounter Medications as of 04/29/2022  Medication Sig   ALPRAZolam (XANAX) 0.25 MG tablet Take 0.5-1 tablets (0.125-0.25 mg total) by mouth at bedtime as needed for anxiety.   aspirin EC 81 MG tablet Take 1 tablet (81 mg total) by mouth  2 (two) times daily.   atorvastatin (LIPITOR) 40 MG tablet TAKE 1 TABLET BY MOUTH EVERY DAY   azithromycin (ZITHROMAX) 250 MG tablet Take 500 mg once, then 250 mg for four days   cetirizine (ZYRTEC) 10 MG chewable tablet Chew 10 mg by mouth daily as needed for allergies.   docusate sodium (COLACE) 100 MG capsule Take 1 capsule (100 mg total) by mouth 2 (two) times daily. While taking narcotic pain medicine.   FLUoxetine (PROZAC) 20 MG capsule TAKE 1 CAPSULE BY MOUTH EVERY DAY   Nutritional Supplements (JUICE PLUS FIBRE) LIQD Take by mouth daily.   omeprazole (PRILOSEC) 40 MG capsule TAKE 1 CAPSULE (40 MG TOTAL) BY MOUTH DAILY.   senna (SENOKOT) 8.6 MG TABS tablet Take 2 tablets (17.2 mg total) by mouth 2 (two) times daily.   SUMAtriptan (IMITREX) 100 MG tablet TAKE 1 TABLET BY MOUTH AS NEEDED FOR HEADACHE. MAY REPEAT IN 2 HOURS IF HEADACHE PERSISTS OR RECURS   No facility-administered encounter medications on file as of 04/29/2022.    Allergies (verified) Other   History: Past Medical History:  Diagnosis Date   Anxiety    Arthritis    Basal cell adenocarcinoma    Diverticulosis    Elevated cholesterol    GERD without esophagitis    Heartburn    History of chicken pox    Insomnia    Migraine headache    Osteoarthritis    Lumbar spine; hands; feet   PONV (postoperative nausea and vomiting)    UTI (  lower urinary tract infection)    Past Surgical History:  Procedure Laterality Date   ABDOMINAL HYSTERECTOMY  1988   APPENDECTOMY  1988   BASAL CELL CARCINOMA EXCISION     Multiple   BLADDER REPAIR  2000   Mesh   BREAST BIOPSY  1990   CHOLECYSTECTOMY     EXCISION HAGLUND'S DEFORMITY WITH ACHILLES TENDON REPAIR Right 10/30/2021   Procedure: Excision of haglund deformity, right Achilles tendon reconstruction;  Surgeon: Wylene Simmer, MD;  Location: [facility];  Service: Orthopedics;  Laterality: Right;   GASTROC RECESSION EXTREMITY Right 10/30/2021   Procedure: Right  gastroc recession;  Surgeon: Wylene Simmer, MD;  Location: [facility];  Service: Orthopedics;  Laterality: Right;   Family History  Problem Relation Age of Onset   57 Mother        Living   Diabetes Mother    Hyperlipidemia Mother    Hypertension Mother    Arthritis-Osteo Father        Living   Diabetes Father    Hypertension Father    Healthy Sister        x1   Healthy Brother        x1   Breast cancer Maternal Aunt    Heart attack Maternal Grandmother    Alzheimer's disease Maternal Grandmother    Heart attack Maternal Grandfather    Dementia Paternal Grandmother    Pancreatic cancer Paternal Grandfather    Healthy Son        x2   Colon cancer Neg Hx    Colon polyps Neg Hx    Liver disease Neg Hx    Esophageal cancer Neg Hx    Stomach cancer Neg Hx    Rectal cancer Neg Hx    Social History   Socioeconomic History   Marital status: Married    Spouse name: Not on file   Number of children: Not on file   Years of education: Not on file   Highest education level: Not on file  Occupational History   Not on file  Tobacco Use   Smoking status: Never   Smokeless tobacco: Never  Vaping Use   Vaping Use: Never used  Substance and Sexual Activity   Alcohol use: Yes    Alcohol/week: 2.0 standard drinks of alcohol    Types: 2 Standard drinks or equivalent per week    Comment: occasinal wine/beer   Drug use: No   Sexual activity: Yes    Partners: Male  Other Topics Concern   Not on file  Social History Narrative   [place].   Social Determinants of Health   Financial Resource Strain: Low Risk  (04/29/2022)   Overall Financial Resource Strain (CARDIA)    Difficulty of Paying Living Expenses: Not hard at all  Food Insecurity: No Food Insecurity (04/29/2022)   Hunger Vital Sign    Worried About Running Out of Food in the Last Year: Never true    Ran Out of Food in the Last Year: Never true  Transportation Needs: No Transportation  Needs (04/29/2022)   PRAPARE - Hydrologist (Medical): No    Lack of Transportation (Non-Medical): No  Physical Activity: Insufficiently Active (04/29/2022)   Exercise Vital Sign    Days of Exercise per Week: 2 days    Minutes of Exercise per Session: 60 min  Stress: No Stress Concern Present (04/29/2022)   [place]  Feeling of Stress : Not at all  Social Connections: Socially Integrated (04/29/2022)   Social Connection and Isolation Panel [NHANES]    Frequency of Communication with Friends and Family: More than three times a week    Frequency of Social Gatherings with Friends and Family: More than three times a week    Attends Religious Services: More than 4 times per year    Active Member of [employer] or Organizations: Yes    Attends Music therapist: More than 4 times per year    Marital Status: Married    Tobacco Counseling Counseling given: Not Answered   Clinical Intake:  Pre-visit preparation completed: No  Pain : No/denies pain     BMI - recorded: 30.65 Nutritional Status: BMI > 30  Obese Nutritional Risks: None Diabetes: No  How often do you need to have someone help you when you read instructions, pamphlets, or other written materials from your doctor or pharmacy?: 1 - Never  Diabetic?  No  Interpreter Needed?: No  Information entered by :: Rolene Arbour LPN   Activities of Daily Living    04/29/2022    1:39 PM 10/30/2021    6:42 AM  In your present state of health, do you have any difficulty performing the following activities:  Hearing? 0 0  Vision? 0 0  Difficulty concentrating or making decisions? 0 0  Walking or climbing stairs? 0 0  Dressing or bathing? 0 0  Doing errands, shopping? 0   Preparing Food and eating ? N   Using the Toilet? N   In the past six months, have you accidently leaked urine? N   Do you have problems with loss of bowel  control? N   Managing your Medications? N   Managing your Finances? N   Housekeeping or managing your Housekeeping? N     Patient Care Team: [place], Betty G, MD as PCP - General (Family Medicine) Early Osmond, MD as PCP - Cardiology (Cardiology) Rolm Bookbinder, MD as Consulting Physician (Dermatology) Janyth Contes, MD as Consulting Physician (Obstetrics and Gynecology)  Indicate any recent Medical Services you may have received from other than [facility] providers in the past year (date may be approximate).     Assessment:   This is a routine wellness examination for [place].  Hearing/Vision screen Hearing Screening - Comments:: Denies hearing difficulties   Vision Screening - Comments:: Wears reading glasses - up to date with routine eye exams with Dr Celene Squibb   Dietary issues and exercise activities discussed: Current Exercise Habits: Home exercise routine, Type of exercise: walking, Time (Minutes): 60, Frequency (Times/Week): 2, Weekly Exercise (Minutes/Week): 120, Intensity: Moderate, Exercise limited by: None identified   Goals Addressed               This Visit's Progress     Patient stated (pt-stated)        Continue to exercise and get stronger       Depression Screen    04/29/2022    1:37 PM 06/22/2020   10:25 AM 02/27/2019    9:43 AM 02/01/2018   10:26 AM 10/30/2014    7:21 PM  PHQ 2/9 Scores  PHQ - 2 Score 0 0 0 0 0    Fall Risk    04/29/2022    1:40 PM 06/09/2021   10:58 AM 02/27/2019    2:00 PM 10/30/2014    7:21 PM  [place] in the past year? 0 0 0 No  Number  falls in past yr: 0  0   Injury with Fall? 0  0   Risk for fall due to : No Fall Risks     Follow up Falls prevention discussed  Education provided     [place]:  Any stairs in or around the home? Yes  If so, are there any without handrails? No  Home free of loose throw rugs in walkways, pet beds, electrical cords, etc? Yes  Adequate  lighting in your home to reduce risk of falls? Yes   ASSISTIVE DEVICES UTILIZED TO PREVENT FALLS:  Life alert? No  Use of a cane, walker or w/c? No  Grab bars in the bathroom? Yes  Shower chair or bench in shower? Yes  Elevated toilet seat or a handicapped toilet? Yes   TIMED UP AND GO:  Was the test performed? No . Audio Visit  Cognitive Function:        04/29/2022    1:41 PM  6CIT Screen  What Year? 0 points  What month? 0 points  What time? 0 points  Count back from 20 0 points  Months in reverse 0 points  Repeat phrase 0 points  Total Score 0 points    Immunizations Immunization History  Administered Date(s) Administered   Influenza,inj,Quad PF,6+ Mos 09/17/2014, 06/26/2015, 06/17/2016, 07/20/2017, 06/10/2018, 06/24/2019, 06/18/2020   Influenza-Unspecified 05/17/2018   Tdap 02/01/2018   Zoster Recombinat (Shingrix) 02/04/2018, 06/30/2018    TDAP status: Up to date  Flu Vaccine status: Due, Education has been provided regarding the importance of this vaccine. Advised may receive this vaccine at local pharmacy or Health Dept. Aware to provide a copy of the vaccination record if obtained from local pharmacy or Health Dept. Verbalized acceptance and understanding.  Pneumococcal vaccine status: Due, Education has been provided regarding the importance of this vaccine. Advised may receive this vaccine at local pharmacy or Health Dept. Aware to provide a copy of the vaccination record if obtained from local pharmacy or Health Dept. Verbalized acceptance and understanding.  Covid-19 vaccine status: Declined, Education has been provided regarding the importance of this vaccine but patient still declined. Advised may receive this vaccine at local pharmacy or Health Dept.or vaccine clinic. Aware to provide a copy of the vaccination record if obtained from local pharmacy or Health Dept. Verbalized acceptance and understanding.  Qualifies for Shingles Vaccine? Yes   Zostavax  completed Yes   Shingrix Completed?: Yes  Screening Tests Health Maintenance  Topic Date Due   COVID-19 Vaccine (1) 05/15/2022 (Originally 04/25/1961)   INFLUENZA VACCINE  11/15/2022 (Originally 03/17/2022)   Pneumonia Vaccine 27+ Years old (1 - PCV) 04/30/2023 (Originally 04/25/2021)   MAMMOGRAM  04/30/2023 (Originally 06/29/2020)   DEXA SCAN  04/30/2023 (Originally 04/25/2021)   TETANUS/TDAP  02/02/2028   COLONOSCOPY (Pts 45-70yr Insurance coverage will need to be confirmed)  09/17/2028   Hepatitis C Screening  Completed   Zoster Vaccines- Shingrix  Completed   HPV VACCINES  Aged Out    Health Maintenance  There are no preventive care reminders to display for this patient.   Colorectal cancer screening: Type of screening: Colonoscopy. Completed 09/17/21. Repeat every 7 years  Mammogram status: Ordered Patient deferred. Pt provided with contact info and advised to call to schedule appt.   Bone Density status: Ordered Patient deferred. Pt provided with contact info and advised to call to schedule appt.  Lung Cancer Screening: (Low Dose CT Chest recommended if Age 66-80years, 30 pack-year currently  smoking OR have quit w/in 15years.) does not qualify.     Additional Screening:  Hepatitis C Screening: does qualify; Completed 02/01/18  Vision Screening: Recommended annual ophthalmology exams for early detection of glaucoma and other disorders of the eye. Is the patient up to date with their annual eye exam?  Yes  Who is the provider or what is the name of the office in which the patient attends annual eye exams? Dr Celene Squibb If pt is not established with a provider, would they like to be referred to a provider to establish care? No .   Dental Screening: Recommended annual dental exams for proper oral hygiene  Community Resource Referral / Chronic Care Management:  CRR required this visit?  No   CCM required this visit?  No      Plan:     I have personally reviewed and noted  the following in the patient's chart:   Medical and social history Use of alcohol, tobacco or illicit drugs  Current medications and supplements including opioid prescriptions. Patient is not currently taking opioid prescriptions. Functional ability and status Nutritional status Physical activity Advanced directives List of other physicians Hospitalizations, surgeries, and ER visits in previous 12 months Vitals Screenings to include cognitive, depression, and falls Referrals and appointments  In addition, I have reviewed and discussed with patient certain preventive protocols, quality metrics, and best practice recommendations. A written personalized care plan for preventive services as well as general preventive health recommendations were provided to patient.     [name], LPN   [id number]   Nurse Notes: None

## 2022-06-09 ENCOUNTER — Ambulatory Visit: Payer: Medicare Other | Admitting: Family Medicine

## 2022-06-16 NOTE — Progress Notes (Signed)
HPI: Kathryn Cummings is a 66 y.o. female with hx of migraine headaches, GERD,HLD,prediabetes,insomnia, BCC, and anxiety here today for her annual follow up. She was last seen on 06/09/21, when she was c/o CP.  She reports that she is still experiencing occasional chest discomfort but it seems to be GI related, found to have a hiatal hernia. Problem is not exacerbated by exertion and no associated symptoms. She is on Omeprazole 40 mg daily.  Reporting negative cardiac work up. Evaluated by cardiologist, Dr Ali Lowe on 07/04/21.  She states that she had coronary CT calcium score that was zero.She was instructed to follow as needed.  HLD: She has concerns about her cholesterol medication, atorvastatin, as she believes it has been causing body/muscle aches. She has stopped taking the medication for approximately six months, and her achiness has greatly improved.  Lab Results  Component Value Date   CHOL 230 (H) 06/09/2021   HDL 59.30 06/09/2021   LDLCALC 154 (H) 06/18/2020   LDLDIRECT 134.0 06/09/2021   TRIG 330.0 (H) 06/09/2021   CHOLHDL 4 06/09/2021   Migraine headaches: She is currently taking Imitrex 100 mg daily as needed, it is not frequent. No new associated symptoms.  Anxiety and insomnia: She is on fluoxetine, which dose she has reduced from 40 mg to 20 mg. She would like to try to wean medication off. She is on Xanax 0.25 mg, which she takes occasionally to help with insomnia but does not help. She tried Trazodone 25-50 mg but caused dizziness, noted when she was getting up to use the bathroom in the middle of the night.  Prediabetes: Negative for abdominal pain, nausea, polydipsia,polyuria, or polyphagia. She is making efforts to maintain a healthy lifestyle by exercising at [facility] , has been traveling lately but planning on going back a few times per week. Mentions that her sister has recently been prescribed metformin for prediabetes.  Lab Results  Component Value Date    HGBA1C 6.1 06/09/2021   Review of Systems  Constitutional:  Negative for activity change, appetite change and fever.  HENT:  Negative for mouth sores, nosebleeds and trouble swallowing.   Eyes:  Negative for redness and visual disturbance.  Respiratory:  Negative for cough, shortness of breath and wheezing.   Cardiovascular:  Negative for palpitations and leg swelling.  Gastrointestinal:  Negative for vomiting.       Negative for changes in bowel habits.  Endocrine: Negative for cold intolerance and heat intolerance.  Genitourinary:  Negative for decreased urine volume, difficulty urinating, dysuria and hematuria.  Skin:  Negative for rash.  Neurological:  Negative for syncope, facial asymmetry and weakness.  Psychiatric/Behavioral:  Positive for sleep disturbance. Negative for confusion. The patient is nervous/anxious.    Current Outpatient Medications on File Prior to Visit  Medication Sig Dispense Refill   aspirin EC 81 MG tablet Take 1 tablet (81 mg total) by mouth 2 (two) times daily. 28 tablet 0   cetirizine (ZYRTEC) 10 MG chewable tablet Chew 10 mg by mouth daily as needed for allergies.     docusate sodium (COLACE) 100 MG capsule Take 1 capsule (100 mg total) by mouth 2 (two) times daily. While taking narcotic pain medicine. 30 capsule 0   FLUoxetine (PROZAC) 20 MG capsule TAKE 1 CAPSULE BY MOUTH EVERY DAY 90 capsule 1   Nutritional Supplements (JUICE PLUS FIBRE) LIQD Take by mouth daily.     omeprazole (PRILOSEC) 40 MG capsule TAKE 1 CAPSULE (40 MG TOTAL) BY MOUTH DAILY. [place]  capsule 0   senna (SENOKOT) 8.6 MG TABS tablet Take 2 tablets (17.2 mg total) by mouth 2 (two) times daily. 30 tablet 0   SUMAtriptan (IMITREX) 100 MG tablet TAKE 1 TABLET BY MOUTH AS NEEDED FOR HEADACHE. MAY REPEAT IN 2 HOURS IF HEADACHE PERSISTS OR RECURS 10 tablet 3   No current facility-administered medications on file prior to visit.   Past Medical History:  Diagnosis Date   Anxiety    Arthritis     Basal cell adenocarcinoma    Diverticulosis    Elevated cholesterol    GERD without esophagitis    Heartburn    History of chicken pox    Insomnia    Migraine headache    Osteoarthritis    Lumbar spine; hands; feet   PONV (postoperative nausea and vomiting)    UTI (lower urinary tract infection)     Past Surgical History:  Procedure Laterality Date   ABDOMINAL HYSTERECTOMY  1988   APPENDECTOMY  1988   BASAL CELL CARCINOMA EXCISION     Multiple   BLADDER REPAIR  2000   Mesh   BREAST BIOPSY  1990   CHOLECYSTECTOMY     EXCISION HAGLUND'S DEFORMITY WITH ACHILLES TENDON REPAIR Right 10/30/2021   Procedure: Excision of haglund deformity, right Achilles tendon reconstruction;  Surgeon: Wylene Simmer, MD;  Location: [place];  Service: Orthopedics;  Laterality: Right;   GASTROC RECESSION EXTREMITY Right 10/30/2021   Procedure: Right gastroc recession;  Surgeon: Wylene Simmer, MD;  Location: [place];  Service: Orthopedics;  Laterality: Right;    Allergies  Allergen Reactions   Other Rash    Bactrium     Family History  Problem Relation Age of Onset   77 Mother        Living   Diabetes Mother    Hyperlipidemia Mother    Hypertension Mother    Arthritis-Osteo Father        Living   Diabetes Father    Hypertension Father    Healthy Sister        x1   Healthy Brother        x1   Breast cancer Maternal Aunt    Heart attack Maternal Grandmother    Alzheimer's disease Maternal Grandmother    Heart attack Maternal Grandfather    Dementia Paternal Grandmother    Pancreatic cancer Paternal Grandfather    Healthy Son        x2   Colon cancer Neg Hx    Colon polyps Neg Hx    Liver disease Neg Hx    Esophageal cancer Neg Hx    Stomach cancer Neg Hx    Rectal cancer Neg Hx     Social History   Socioeconomic History   Marital status: Married    Spouse name: Not on file   Number of children: Not on file   Years of  education: Not on file   Highest education level: Not on file  Occupational History   Not on file  Tobacco Use   Smoking status: Never   Smokeless tobacco: Never  Vaping Use   Vaping Use: Never used  Substance and Sexual Activity   Alcohol use: Yes    Alcohol/week: 2.0 standard drinks of alcohol    Types: 2 Standard drinks or equivalent per week    Comment: occasinal wine/beer   Drug use: No   Sexual activity: Yes    Partners: Male  Other Topics Concern   Not  on file  [place].   Social Determinants of Health   Financial Resource Strain: Low Risk  (04/29/2022)   Overall Financial Resource Strain (CARDIA)    Difficulty of Paying Living Expenses: Not hard at all  Food Insecurity: No Food Insecurity (04/29/2022)   Hunger Vital Sign    Worried About Running Out of Food in the Last Year: Never true    Ran Out of Food in the Last Year: Never true  Transportation Needs: No Transportation Needs (04/29/2022)   PRAPARE - Hydrologist (Medical): No    Lack of Transportation (Non-Medical): No  Physical Activity: Insufficiently Active (04/29/2022)   Exercise Vital Sign    Days of Exercise per Week: 2 days    Minutes of Exercise per Session: 60 min  Stress: No Stress Concern Present (04/29/2022)   [place]    Feeling of Stress : Not at all  Social Connections: [place] (04/29/2022)   Social Connection and Isolation Panel [NHANES]    Frequency of Communication with Friends and Family: More than three times a week    Frequency of Social Gatherings with Friends and Family: More than three times a week    Attends Religious Services: More than 4 times per year    Active Member of [employer] or Organizations: Yes    Attends Music therapist: More than 4 times per year    Marital Status: Married   Vitals:   06/17/22 1515  BP: 120/80  Pulse:  95  Resp: 12  Temp: 98.4 F (36.9 C)  SpO2: 97%  Body mass index is 30.65 kg/m. Wt Readings from Last 3 Encounters:  06/17/22 173 lb (78.5 kg)  04/29/22 173 lb (78.5 kg)  10/30/21 174 lb 2.6 oz (79 kg)  Physical Exam Vitals and nursing note reviewed.  Constitutional:      General: She is not in acute distress.    Appearance: She is well-developed.  HENT:     Head: Normocephalic and atraumatic.     Mouth/Throat:     Mouth: Mucous membranes are moist.     Pharynx: Oropharynx is clear.  Eyes:     Conjunctiva/sclera: Conjunctivae normal.  Cardiovascular:     Rate and Rhythm: Normal rate and regular rhythm.     Pulses:          Dorsalis pedis pulses are 2+ on the right side and 2+ on the left side.     Heart sounds: No murmur heard. Pulmonary:     Effort: Pulmonary effort is normal. No respiratory distress.     Breath sounds: Normal breath sounds.  Abdominal:     Palpations: Abdomen is soft. There is no hepatomegaly or mass.     Tenderness: There is no abdominal tenderness.  Lymphadenopathy:     Cervical: No cervical adenopathy.  Skin:    General: Skin is warm.     Findings: No erythema or rash.  Neurological:     General: No focal deficit present.     Mental Status: She is alert and oriented to person, place, and time.     Cranial Nerves: No cranial nerve deficit.     Gait: Gait normal.  Psychiatric:        Mood and Affect: Affect normal. Mood is anxious.   ASSESSMENT AND PLAN:  Ms. Lamisha Roussell was here today annual physical examination.  Orders Placed This Encounter  Procedures  Comprehensive metabolic panel   Lipid panel   Hemoglobin A1c   LDL cholesterol, direct   Lab Results  Component Value Date   CHOL 299 (H) 06/17/2022   HDL 54.70 06/17/2022   LDLCALC 154 (H) 06/18/2020   LDLDIRECT 193.0 06/17/2022   TRIG (H) 06/17/2022    450.0 Triglyceride is over 400; calculations on Lipids are invalid.   CHOLHDL 5 06/17/2022   Lab Results  Component Value  Date   ALT 43 (H) 06/17/2022   AST 38 (H) 06/17/2022   ALKPHOS 95 06/17/2022   BILITOT 0.5 06/17/2022   Lab Results  Component Value Date   CREATININE 0.91 06/17/2022   BUN 20 06/17/2022   NA 135 06/17/2022   K 4.0 06/17/2022   CL 98 06/17/2022   CO2 27 06/17/2022   Lab Results  Component Value Date   HGBA1C 6.5 06/17/2022   Prediabetes Consistency with a healthy lifestyle encouraged for diabetes prevention.  Insomnia, unspecified type We discussed other options as well as side effects. Doxepin is an option. She would like increasing dose of Xanax, so will try 0.5 mg instead 0.25 mg. Good sleep hygiene.  Generalized anxiety disorder Stable overall. Recommend continuing Fluoxetine 20 mg daily, we can consider continuing decreasing dose in a few months. She would like to try higher dose of Xanax to help with sleep, from 0.25 to 0.5 mg daily as needed. Some side effects discussed. F/U in 6 months.  -     ALPRAZolam (XANAX) 0.5 MG tablet; Take 1 tablet (0.5 mg total) by mouth 2 (two) times daily as needed for anxiety.  Statin myopathy Myalgia greatly improved after discontinuing Atorvastatin.  Mixed hyperlipidemia Myalgias with Atorvastatin. Non pharmacologic treatment recommended for now. Further recommendations will be given according to 10 years CVD risk score and lipid panel numbers.  Gastroesophageal reflux disease without esophagitis  Continue Omeprazole 40 mg daily and GERD precautions.  Healthcare maintenance She has had a colonoscopy on 09/17/21, negative. Mammogram in March 2022.  Declined vaccinations.  Return in about 6 months (around 12/16/2022).  Wynn Alldredge G. [place], MD  [facility]. [place] office.

## 2022-06-17 ENCOUNTER — Encounter: Payer: Self-pay | Admitting: Family Medicine

## 2022-06-17 ENCOUNTER — Ambulatory Visit (INDEPENDENT_AMBULATORY_CARE_PROVIDER_SITE_OTHER): Payer: Medicare Other | Admitting: Family Medicine

## 2022-06-17 VITALS — BP 120/80 | HR 95 | Temp 98.4°F | Resp 12 | Ht 63.0 in | Wt 173.0 lb

## 2022-06-17 DIAGNOSIS — T466X5A Adverse effect of antihyperlipidemic and antiarteriosclerotic drugs, initial encounter: Secondary | ICD-10-CM

## 2022-06-17 DIAGNOSIS — R7303 Prediabetes: Secondary | ICD-10-CM | POA: Diagnosis not present

## 2022-06-17 DIAGNOSIS — Z0000 Encounter for general adult medical examination without abnormal findings: Secondary | ICD-10-CM

## 2022-06-17 DIAGNOSIS — G4700 Insomnia, unspecified: Secondary | ICD-10-CM | POA: Diagnosis not present

## 2022-06-17 DIAGNOSIS — E782 Mixed hyperlipidemia: Secondary | ICD-10-CM | POA: Diagnosis not present

## 2022-06-17 DIAGNOSIS — F411 Generalized anxiety disorder: Secondary | ICD-10-CM | POA: Diagnosis not present

## 2022-06-17 DIAGNOSIS — K219 Gastro-esophageal reflux disease without esophagitis: Secondary | ICD-10-CM

## 2022-06-17 DIAGNOSIS — R7401 Elevation of levels of liver transaminase levels: Secondary | ICD-10-CM

## 2022-06-17 DIAGNOSIS — G720 Drug-induced myopathy: Secondary | ICD-10-CM

## 2022-06-17 MED ORDER — ALPRAZOLAM 0.5 MG PO TABS: 0.5000 mg | ORAL_TABLET | Freq: Two times a day (BID) | ORAL | 1 refills | Status: DC | PRN

## 2022-06-17 NOTE — Assessment & Plan Note (Signed)
Myalgias with Atorvastatin. Non pharmacologic treatment recommended for now. Further recommendations will be given according to 10 years CVD risk score and lipid panel numbers.

## 2022-06-17 NOTE — Patient Instructions (Addendum)
A few things to remember from today's visit:  Mixed hyperlipidemia  Prediabetes  Insomnia, unspecified type  Generalized anxiety disorder  For insomnia try increasing dose of Xanax to 0.5 mg. Good sleep hygiene. Continue Omeprazole and Fluoxetine same dose.  If you need refills for medications you take chronically, please call your pharmacy. Do not use My Chart to request refills or for acute issues that need immediate attention. If you send a my chart message, it may take a few days to be addressed, specially if I am not in the office.  Please be sure medication list is accurate. If a new problem present, please set up appointment sooner than planned today.

## 2022-06-18 LAB — COMPREHENSIVE METABOLIC PANEL
ALT: 43 U/L — ABNORMAL HIGH (ref 0–35)
AST: 38 U/L — ABNORMAL HIGH (ref 0–37)
Albumin: 4.4 g/dL (ref 3.5–5.2)
Alkaline Phosphatase: 95 U/L (ref 39–117)
BUN: 20 mg/dL (ref 6–23)
CO2: 27 mEq/L (ref 19–32)
Calcium: 10 mg/dL (ref 8.4–10.5)
Chloride: 98 mEq/L (ref 96–112)
Creatinine, Ser: 0.91 mg/dL (ref 0.40–1.20)
GFR: 65.91 mL/min (ref >60.00)
Glucose, Bld: 87 mg/dL (ref 70–99)
Potassium: 4 mEq/L (ref 3.5–5.1)
Sodium: 135 mEq/L (ref 135–145)
Total Bilirubin: 0.5 mg/dL (ref 0.2–1.2)
Total Protein: 7.9 g/dL (ref 6.0–8.3)

## 2022-06-18 LAB — LIPID PANEL
Cholesterol: 299 mg/dL — ABNORMAL HIGH (ref 0–200)
HDL: 54.7 mg/dL (ref >39.00)
Total CHOL/HDL Ratio: 5
Triglycerides: 450 mg/dL — ABNORMAL HIGH (ref 0.0–149.0)

## 2022-06-18 LAB — LDL CHOLESTEROL, DIRECT: Direct LDL: 193 mg/dL

## 2022-06-18 LAB — HEMOGLOBIN A1C: Hgb A1c MFr Bld: 6.5 % (ref 4.6–6.5)

## 2022-06-21 DIAGNOSIS — T466X5A Adverse effect of antihyperlipidemic and antiarteriosclerotic drugs, initial encounter: Secondary | ICD-10-CM | POA: Insufficient documentation

## 2022-06-21 DIAGNOSIS — G720 Drug-induced myopathy: Secondary | ICD-10-CM | POA: Insufficient documentation

## 2022-06-22 ENCOUNTER — Other Ambulatory Visit: Payer: Self-pay

## 2022-06-22 MED ORDER — ROSUVASTATIN CALCIUM 10 MG PO TABS: ORAL_TABLET | ORAL | 3 refills | Status: DC

## 2022-06-25 ENCOUNTER — Ambulatory Visit (HOSPITAL_BASED_OUTPATIENT_CLINIC_OR_DEPARTMENT_OTHER)
Admission: RE | Admit: 2022-06-25 | Discharge: 2022-06-25 | Disposition: A | Discharge status: Home / Self Care | Payer: Medicare Other | Source: Ambulatory Visit | Attending: Family Medicine | Admitting: Family Medicine

## 2022-06-25 DIAGNOSIS — R7401 Elevation of levels of liver transaminase levels: Secondary | ICD-10-CM | POA: Diagnosis present

## 2022-07-19 DIAGNOSIS — M1710 Unilateral primary osteoarthritis, unspecified knee: Secondary | ICD-10-CM | POA: Insufficient documentation

## 2022-07-25 ENCOUNTER — Other Ambulatory Visit: Payer: Self-pay | Admitting: Family Medicine

## 2022-07-25 DIAGNOSIS — K219 Gastro-esophageal reflux disease without esophagitis: Secondary | ICD-10-CM

## 2022-08-18 ENCOUNTER — Telehealth: Payer: Self-pay

## 2022-08-18 DIAGNOSIS — E782 Mixed hyperlipidemia: Secondary | ICD-10-CM

## 2022-08-18 NOTE — Telephone Encounter (Signed)
Pt needs a lab appt, fasting. Thank you!

## 2022-08-18 NOTE — Telephone Encounter (Signed)
-----   Message from Rodrigo Ran, [place] sent at 06/22/2022 12:27 PM EST ----- LFT & FLP in 8 weeks.

## 2022-08-19 ENCOUNTER — Other Ambulatory Visit: Payer: Self-pay | Admitting: Family Medicine

## 2022-08-19 DIAGNOSIS — F411 Generalized anxiety disorder: Secondary | ICD-10-CM

## 2022-08-20 NOTE — Telephone Encounter (Signed)
LVM for pt to callback and schedule fasting lab appt.      FYI

## 2022-08-21 ENCOUNTER — Other Ambulatory Visit (INDEPENDENT_AMBULATORY_CARE_PROVIDER_SITE_OTHER): Payer: Medicare Other

## 2022-08-21 DIAGNOSIS — E782 Mixed hyperlipidemia: Secondary | ICD-10-CM

## 2022-08-21 DIAGNOSIS — R7401 Elevation of levels of liver transaminase levels: Secondary | ICD-10-CM

## 2022-08-21 LAB — LIPID PANEL
Cholesterol: 264 mg/dL — ABNORMAL HIGH (ref 0–200)
HDL: 59.1 mg/dL (ref >39.00)
NonHDL: 204.58
Total CHOL/HDL Ratio: 4
Triglycerides: 290 mg/dL — ABNORMAL HIGH (ref 0.0–149.0)
VLDL: 58 mg/dL — ABNORMAL HIGH (ref 0.0–40.0)

## 2022-08-21 LAB — HEPATIC FUNCTION PANEL
ALT: 21 U/L (ref 0–35)
AST: 22 U/L (ref 0–37)
Albumin: 4.5 g/dL (ref 3.5–5.2)
Alkaline Phosphatase: 79 U/L (ref 39–117)
Bilirubin, Direct: 0 mg/dL (ref 0.0–0.3)
Total Bilirubin: 0.4 mg/dL (ref 0.2–1.2)
Total Protein: 7.4 g/dL (ref 6.0–8.3)

## 2022-08-21 LAB — LDL CHOLESTEROL, DIRECT: Direct LDL: 164 mg/dL

## 2022-08-24 ENCOUNTER — Encounter: Payer: Self-pay | Admitting: Family Medicine

## 2022-08-24 LAB — HEPATITIS B SURFACE ANTIBODY,QUALITATIVE: Hep B S Ab: NONREACTIVE

## 2022-08-24 LAB — HEPATITIS B SURFACE ANTIGEN: Hepatitis B Surface Ag: NONREACTIVE

## 2022-10-22 ENCOUNTER — Other Ambulatory Visit: Payer: Self-pay | Admitting: Family Medicine

## 2022-10-22 DIAGNOSIS — K219 Gastro-esophageal reflux disease without esophagitis: Secondary | ICD-10-CM

## 2022-12-07 ENCOUNTER — Ambulatory Visit (INDEPENDENT_AMBULATORY_CARE_PROVIDER_SITE_OTHER): Payer: Medicare Other | Admitting: Family Medicine

## 2022-12-07 ENCOUNTER — Encounter: Payer: Self-pay | Admitting: Family Medicine

## 2022-12-07 VITALS — BP 128/74 | HR 69 | Temp 98.0°F | Ht 63.0 in | Wt 175.2 lb

## 2022-12-07 DIAGNOSIS — G720 Drug-induced myopathy: Secondary | ICD-10-CM | POA: Diagnosis not present

## 2022-12-07 DIAGNOSIS — F411 Generalized anxiety disorder: Secondary | ICD-10-CM

## 2022-12-07 DIAGNOSIS — T466X5A Adverse effect of antihyperlipidemic and antiarteriosclerotic drugs, initial encounter: Secondary | ICD-10-CM

## 2022-12-07 DIAGNOSIS — K219 Gastro-esophageal reflux disease without esophagitis: Secondary | ICD-10-CM

## 2022-12-07 DIAGNOSIS — G4700 Insomnia, unspecified: Secondary | ICD-10-CM

## 2022-12-07 DIAGNOSIS — E782 Mixed hyperlipidemia: Secondary | ICD-10-CM

## 2022-12-07 DIAGNOSIS — Z13820 Encounter for screening for osteoporosis: Secondary | ICD-10-CM

## 2022-12-07 DIAGNOSIS — Z7689 Persons encountering health services in other specified circumstances: Secondary | ICD-10-CM

## 2022-12-07 DIAGNOSIS — R7303 Prediabetes: Secondary | ICD-10-CM

## 2022-12-07 DIAGNOSIS — G43009 Migraine without aura, not intractable, without status migrainosus: Secondary | ICD-10-CM

## 2022-12-07 DIAGNOSIS — E348 Other specified endocrine disorders: Secondary | ICD-10-CM

## 2022-12-07 MED ORDER — EZETIMIBE 10 MG PO TABS: 10.0000 mg | ORAL_TABLET | Freq: Every day | ORAL | 3 refills | Status: DC

## 2022-12-07 NOTE — Assessment & Plan Note (Addendum)
Taking Fluoxetine  daily. She expressed she would like to wean off of medication to see her baseline of how her emotional wellbeing is. Recommend to discontinue Fluoxetine, recommend to taper medication down. 1-2 weeks take medication every other day; the 2nd or 3rd week start taking it every 3rd day for 1-2 weeks. Follow up in 1 month to see how she has transitioned.  Continue with Alprazolam 0.5mg  as needed.

## 2022-12-07 NOTE — Progress Notes (Signed)
New Patient Office Visit  Subjective    Patient ID: Tarri Guilfoil, female    DOB: [birth year]  Age: 67 y.o. MRN: [id number]  CC:  Chief Complaint  Patient presents with   Establish Care    Pt reports [facility] and decided to change due to communication issue. Pt reports she has taken Atorvastatin and Crestor. Pt reports she has body pain with this. Pt would like to see if there is anything else she can do. Pt A1c was 5.9 last labs and would like to talk about this. Pt would like to come off meds.    HPI Anasofia Micallef presents to establish care with new provider.   Patients previous provider was Dr. Betty Swaziland at [facility] [place].  Last seen on 06/17/2022. She is transferring due to language barrier.   Specialist:  GYN: [facility]  with Dr. Sherian Rein  Derm:[facility] with Dr. Aris Lot Cardiology: [facility]. With Dr. Alverda Skeans   Anxiety/Insomnia: Patient is prescribed Alprazolam 0.5mg  2 times a day as needed. However, she takes it 2-3 times a month when she can not sleep. Effective.  Patient takes Fluoxetine 20mg  daily. She would like to discontinue medication to see her baseline. Denies SI and HI. She started on medication in 2015 when spouse become ill.   GERD: She takes Omeprazole 40mg  daily. Effective. She reports if she misses the medication, she will have symptoms. She dose have a hiatal hernia.   Hyperlipidemia: Patient is taking Rosuvastatin 10mg  daily. Still having muscle pain, almost as bad as when she was taking Atorvastatin. She was previously on Atorvastatin and it was causing body/muscle aches. She stopped medication and her aches improved. She was started on Rosuvastatin in November 2023. Exercise: Still a member at [employer], but currently not going to work out.  Diet: Try to do more protein, greek yogurt with berries and granola, dinner consist of a meat,  vegetables, and carbohydrates. Does eat out at restaurants.  Lab Results  Component Value Date   CHOL 264 (H) 08/21/2022   HDL 59.10 08/21/2022   LDLCALC 154 (H) 06/18/2020   LDLDIRECT 164.0 08/21/2022   TRIG 290.0 (H) 08/21/2022   CHOLHDL 4 08/21/2022    Migraines: She reports she gets headaches with temperature change. Does not occur very often. Usually takes Ibuprofen for the pain. Rarely takes Sumatriptan 100mg .   Outpatient Encounter Medications as of 12/07/2022  Medication Sig   ALPRAZolam (XANAX) 0.5 MG tablet Take 1 tablet (0.5 mg total) by mouth 2 (two) times daily as needed for anxiety.   cetirizine (ZYRTEC) 10 MG chewable tablet Chew 10 mg by mouth daily as needed for allergies.   ezetimibe (ZETIA) 10 MG tablet Take 1 tablet (10 mg total) by mouth daily.   FLUoxetine (PROZAC) 20 MG capsule TAKE 1 CAPSULE BY MOUTH EVERY DAY   Nutritional Supplements (JUICE PLUS FIBRE) LIQD Take by mouth daily.   omeprazole (PRILOSEC) 40 MG capsule TAKE 1 CAPSULE (40 MG TOTAL) BY MOUTH DAILY.   rosuvastatin (CRESTOR) 10 MG tablet Take 1 tablet by mouth 3 times per week.   SUMAtriptan (IMITREX) 100 MG tablet TAKE 1 TABLET BY MOUTH AS NEEDED FOR HEADACHE. MAY REPEAT IN 2 HOURS IF HEADACHE PERSISTS OR RECURS   No facility-administered encounter medications on file as of 12/07/2022.    Past Medical History:  Diagnosis Date   Anxiety    Arthritis    Basal  cell adenocarcinoma    Diverticulosis    Elevated cholesterol    GERD without esophagitis    Heartburn    Hiatal hernia    History of chicken pox    Insomnia    Migraine headache    Osteoarthritis    Lumbar spine; hands; feet   PONV (postoperative nausea and vomiting)    Prediabetes    UTI (lower urinary tract infection)     Past Surgical History:  Procedure Laterality Date   ABDOMINAL HYSTERECTOMY  1988   APPENDECTOMY  1988   BASAL CELL CARCINOMA EXCISION     Multiple   BLADDER REPAIR  2000   Mesh   BREAST BIOPSY  1990    CHOLECYSTECTOMY     EXCISION HAGLUND'S DEFORMITY WITH ACHILLES TENDON REPAIR Right 10/30/2021   Procedure: Excision of haglund deformity, right Achilles tendon reconstruction;  Surgeon: Toni Arthurs, MD;  Location: [facility];  Service: Orthopedics;  Laterality: Right;   GASTROC RECESSION EXTREMITY Right 10/30/2021   Procedure: Right gastroc recession;  Surgeon: Toni Arthurs, MD;  Location: [facility];  Service: Orthopedics;  Laterality: Right;    Family History  Problem Relation Age of Onset   Arthritis-Osteo Mother        Living   Diabetes Mother    Hyperlipidemia Mother    Hypertension Mother    Arthritis-Osteo Father        Living   Diabetes Father    Hypertension Father    Cancer Sister        Skin   Arthritis Sister    Healthy Sister        x1   Arthritis Brother    Cancer Brother        Skin   Healthy Brother        x1   Healthy Son        x2   Breast cancer Maternal Aunt    Heart attack Maternal Grandmother    Alzheimer's disease Maternal Grandmother    Heart attack Maternal Grandfather    Dementia Paternal Grandmother    Cancer Paternal Grandfather        Liver   Pancreatic cancer Paternal Grandfather    Colon cancer Neg Hx    Colon polyps Neg Hx    Liver disease Neg Hx    Esophageal cancer Neg Hx    Stomach cancer Neg Hx    Rectal cancer Neg Hx     Social History   Socioeconomic History   Marital status: Married    Spouse name: Not on file   Number of children: 2   Years of education: college   Highest education level: Associate degree: academic program  Occupational History   Not on file  Tobacco Use   Smoking status: Never   Smokeless tobacco: Never  Vaping Use   Vaping Use: Never used  Substance and Sexual Activity   Alcohol use: Yes    Alcohol/week: 2.0 standard drinks of alcohol    Types: 2 Standard drinks or equivalent per week    Comment: 2 times a month wine/beer   Drug use: No   Sexual activity: Yes     Partners: Male  Other Topics Concern   Not on file  Social History Narrative   Not on file   Social Determinants of Health   Financial Resource Strain: Low Risk  (04/29/2022)   Overall Financial Resource Strain (CARDIA)    Difficulty of Paying Living Expenses: Not hard at  all  Food Insecurity: No Food Insecurity (12/07/2022)   Hunger Vital Sign    Worried About Running Out of Food in the Last Year: Never true    Ran Out of Food in the Last Year: Never true  Transportation Needs: No Transportation Needs (12/07/2022)   PRAPARE - Administrator, Civil Service (Medical): No    Lack of Transportation (Non-Medical): No  Physical Activity: Insufficiently Active (04/29/2022)   Exercise Vital Sign    Days of Exercise per Week: 2 days    Minutes of Exercise per Session: 60 min  Stress: No Stress Concern Present (04/29/2022)   [employer] of Occupational Health - Occupational Stress Questionnaire    Feeling of Stress : Not at all  Social Connections: Moderately Integrated (12/07/2022)   Social Connection and Isolation Panel [NHANES]    Frequency of Communication with Friends and Family: More than three times a week    Frequency of Social Gatherings with Friends and Family: Twice a week    Attends Religious Services: More than 4 times per year    Active Member of [employer] or Organizations: No    Attends Banker Meetings: 1 to 4 times per year    Marital Status: Separated  Intimate Partner Violence: Not At Risk (12/07/2022)   Humiliation, Afraid, Rape, and Kick questionnaire    Fear of Current or Ex-Partner: No    Emotionally Abused: No    Physically Abused: No    Sexually Abused: No   ROS See HPI above    Objective   BP 128/74   Pulse 69   Temp 98 F (36.7 C)   Ht  (1.6 m)   Wt 175 lb 4 oz (79.5 kg)   SpO2 98%   BMI 31.04 kg/m   Physical Exam Vitals reviewed.  Constitutional:      General: She is not in acute distress.    Appearance: Normal  appearance. She is not ill-appearing, toxic-appearing or diaphoretic.  Eyes:     General:        Right eye: No discharge.        Left eye: No discharge.     Conjunctiva/sclera: Conjunctivae normal.  Cardiovascular:     Rate and Rhythm: Normal rate and regular rhythm.     Heart sounds: Normal heart sounds. No murmur heard.    No friction rub. No gallop.  Pulmonary:     Effort: Pulmonary effort is normal. No respiratory distress.     Breath sounds: Normal breath sounds.  Musculoskeletal:        General: Normal range of motion.  Skin:    General: Skin is warm and dry.  Neurological:     General: No focal deficit present.     Mental Status: She is alert and oriented to person, place, and time. Mental status is at baseline.  Psychiatric:        Mood and Affect: Mood normal.        Behavior: Behavior normal.        Thought Content: Thought content normal.        Judgment: Judgment normal.      Assessment & Plan:  Mixed hyperlipidemia Assessment & Plan: Discontinue Rosuvastatin  daily since patient is having muscle cramps. Patient has previously seen [facility] and had a Cardiac/Coronary CTA with calcium score of 0 back in 06/2021. At that time, she reports she was taking a statin. START Zetia  daily in place of statin therapy.  Ordered lipid panel. Patient is not fasting. She is going to make an appointment to have labs drawn and advised to be fasting. Will see how her lipids are in 3 months with starting Zetia. If not improved, will need to send back to cardiology. Also, mentioned an over the counter fish oil for elevated triglycerides.  Ordered CMP to assess liver function. She did have a right upper quad ultrasound that showed fatty liver from abnormal liver enzymes in Nov 2023.     Orders: -     Ezetimibe; Take 1 tablet (10 mg total) by mouth daily.  Dispense: 90 tablet; Refill: 3 -     Lipid panel; Future -     Comprehensive metabolic panel; Future  Statin  myopathy Assessment & Plan: Discontinue Rosuvastatin 10mg  daily since it is causing muscle aches.    Generalized anxiety disorder Assessment & Plan: Taking Fluoxetine 20mg  daily. She expressed she would like to wean off of medication to see her baseline of how her emotional wellbeing is. Recommend to discontinue Fluoxetine, recommend to taper medication down. 1-2 weeks take medication every other day; the 2nd or 3rd week start taking it every 3rd day for 1-2 weeks. Follow up in 1 month to see how she has transitioned.  Continue with Alprazolam 0.5mg  as needed.    Insomnia, unspecified type Assessment & Plan: Continue with Alprazolam 0.5mg  as needed for insomnia and anxiety. Discussed about need a controlled substance agreement and a urine drug screen before refilling Alprazolam.    Migraine without aura and without status migrainosus, not intractable Assessment & Plan: Continue with Sumatriptan 100mg  as needed for headaches.    Gastroesophageal reflux disease without esophagitis Assessment & Plan: Continue with Omeprazole 40mg  daily. It is effective.    Prediabetes -     Hemoglobin A1c; Future  Screening for osteoporosis -     DG Bone Density; Future  Estradiol deficiency -     DG Bone Density; Future  Encounter to establish care  1.Review health maintenance: -Declines covid booster.  -Mammogram: Dr. Augusto Gamble [facility]. Got it in March, will request records.  -Order bone density.  -Declined PNA vaccine.  2.Ordered A1c. Last A1c was 6.5 on 06/17/2022. Currently, not taking any medication.  Return in about 1 month (around 01/06/2023).   Zandra Abts, NP

## 2022-12-07 NOTE — Assessment & Plan Note (Signed)
Continue with Sumatriptan  as needed for headaches.

## 2022-12-07 NOTE — Patient Instructions (Addendum)
-  It was a pleasure to meet you today and I look forward to taking care of you. -To discontinue Fluoxetine, recommend to taper medication down. 1-2 weeks take medication every other day; the 2nd or 3rd week start taking it every 3rd day for 1-2 weeks.  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 Start with Zetia , 1 tablet a day.  -Ordered A1c to check diabetes, CMP to check kidney and liver disease, and lipids for cholesterol. Recommend to make a lab appointment. Please be fasting, only water or black coffee 6 hrs prior to lab draw. -Discussed about need a controlled substance agreement and a urine drug screen before refilling Alprazolam.  -Recommend to get back into the gym at [facility]. This will help all around for you.  -Continue all other medications. Follow up in 1 month.

## 2022-12-07 NOTE — Assessment & Plan Note (Signed)
Continue with Alprazolam 0.5mg  as needed for insomnia and anxiety. Discussed about need a controlled substance agreement and a urine drug screen before refilling Alprazolam.

## 2022-12-07 NOTE — Assessment & Plan Note (Signed)
Discontinue Rosuvastatin  daily since it is causing muscle aches.

## 2022-12-07 NOTE — Assessment & Plan Note (Signed)
Continue with Omeprazole  daily. It is effective.

## 2022-12-07 NOTE — Assessment & Plan Note (Addendum)
Discontinue Rosuvastatin  daily since patient is having muscle cramps. Patient has previously seen [facility] and had a Cardiac/Coronary CTA with calcium score of 0 back in 06/2021. At that time, she reports she was taking a statin. START Zetia  daily in place of statin therapy. Ordered lipid panel. Patient is not fasting. She is going to make an appointment to have labs drawn and advised to be fasting. Will see how her lipids are in 3 months with starting Zetia. If not improved, will need to send back to cardiology. Also, mentioned an over the counter fish oil for elevated triglycerides.  Ordered CMP to assess liver function. She did have a right upper quad ultrasound that showed fatty liver from abnormal liver enzymes in Nov 2023.

## 2022-12-08 ENCOUNTER — Telehealth: Payer: Self-pay

## 2022-12-08 ENCOUNTER — Other Ambulatory Visit (INDEPENDENT_AMBULATORY_CARE_PROVIDER_SITE_OTHER): Payer: Medicare Other

## 2022-12-08 ENCOUNTER — Encounter: Payer: Self-pay | Admitting: Family Medicine

## 2022-12-08 DIAGNOSIS — E782 Mixed hyperlipidemia: Secondary | ICD-10-CM

## 2022-12-08 DIAGNOSIS — R7303 Prediabetes: Secondary | ICD-10-CM

## 2022-12-08 LAB — COMPREHENSIVE METABOLIC PANEL
ALT: 18 U/L (ref 0–35)
AST: 20 U/L (ref 0–37)
Albumin: 4.3 g/dL (ref 3.5–5.2)
Alkaline Phosphatase: 72 U/L (ref 39–117)
BUN: 16 mg/dL (ref 6–23)
CO2: 28 mEq/L (ref 19–32)
Calcium: 9.7 mg/dL (ref 8.4–10.5)
Chloride: 101 mEq/L (ref 96–112)
Creatinine, Ser: 0.9 mg/dL (ref 0.40–1.20)
GFR: 66.57 mL/min (ref >60.00)
Glucose, Bld: 100 mg/dL — ABNORMAL HIGH (ref 70–99)
Potassium: 4.3 mEq/L (ref 3.5–5.1)
Sodium: 136 mEq/L (ref 135–145)
Total Bilirubin: 0.5 mg/dL (ref 0.2–1.2)
Total Protein: 7.3 g/dL (ref 6.0–8.3)

## 2022-12-08 LAB — LDL CHOLESTEROL, DIRECT: Direct LDL: 180 mg/dL

## 2022-12-08 LAB — LIPID PANEL
Cholesterol: 275 mg/dL — ABNORMAL HIGH (ref 0–200)
HDL: 54.8 mg/dL (ref >39.00)
NonHDL: 220.19
Total CHOL/HDL Ratio: 5
Triglycerides: 386 mg/dL — ABNORMAL HIGH (ref 0.0–149.0)
VLDL: 77.2 mg/dL — ABNORMAL HIGH (ref 0.0–40.0)

## 2022-12-08 LAB — HEMOGLOBIN A1C: Hgb A1c MFr Bld: 6.4 % (ref 4.6–6.5)

## 2022-12-08 NOTE — Telephone Encounter (Signed)
FYI from pt about her lab results / your recommendations

## 2022-12-08 NOTE — Telephone Encounter (Signed)
-----   Message from Alveria Apley, NP sent at 12/08/2022  2:15 PM EDT ----- Total cholesterol and VLDL is elevated-Diet encouraged - increase intake of fresh fruits and vegetables, increase intake of lean proteins. Bake, broil, or grill foods. Avoid fried, greasy, and fatty foods. Avoid fast foods. Increase intake of fiber-rich whole grains. Exercise encouraged - at least 150 minutes per week and advance as tolerated. Triglycerides are significantly elevated-recommend over the counter fish oil.  A1c is prediabetes. Recommend to decrease carbohydrates and sweets. A1c is stable for age. She can start medication if she would like?

## 2022-12-09 ENCOUNTER — Ambulatory Visit (HOSPITAL_BASED_OUTPATIENT_CLINIC_OR_DEPARTMENT_OTHER)
Admission: RE | Admit: 2022-12-09 | Discharge: 2022-12-09 | Disposition: A | Discharge status: Home / Self Care | Payer: Medicare Other | Source: Ambulatory Visit | Attending: Family Medicine | Admitting: Family Medicine

## 2022-12-09 DIAGNOSIS — Z90710 Acquired absence of both cervix and uterus: Secondary | ICD-10-CM | POA: Insufficient documentation

## 2022-12-09 DIAGNOSIS — Z780 Asymptomatic menopausal state: Secondary | ICD-10-CM | POA: Diagnosis not present

## 2022-12-09 DIAGNOSIS — Z13820 Encounter for screening for osteoporosis: Secondary | ICD-10-CM | POA: Diagnosis present

## 2022-12-09 DIAGNOSIS — E348 Other specified endocrine disorders: Secondary | ICD-10-CM | POA: Insufficient documentation

## 2022-12-09 DIAGNOSIS — Z90721 Acquired absence of ovaries, unilateral: Secondary | ICD-10-CM | POA: Diagnosis not present

## 2023-01-01 NOTE — Progress Notes (Unsigned)
   Established Patient Office Visit   Subjective:  Patient ID: Kathryn Cummings, female    DOB: Aug 14, 1956  Age: 67 y.o. MRN: [id number]  No chief complaint on file.   HPI Anxiety: On previous appointment, 12/07/2022, discontinued Fluoxetine since patient expressed she would like to wean off medication to see her baseline of how emotional well being is. She was initially started on medication when her spouse was ill. She was advised to taper the medication ever the next 3 weeks.  ROS See HPI above     Objective:     There were no vitals taken for this visit. {Vitals History (Optional):23777}  Physical Exam  No results found for any visits on 01/04/23.  The 10-year ASCVD risk score (Arnett DK, et al., 2019) is: 7.2%    Assessment & Plan:  Prediabetes  Mixed hyperlipidemia    No follow-ups on file.   Zandra Abts, NP

## 2023-01-04 ENCOUNTER — Ambulatory Visit (INDEPENDENT_AMBULATORY_CARE_PROVIDER_SITE_OTHER): Payer: Medicare Other | Admitting: Family Medicine

## 2023-01-04 ENCOUNTER — Encounter: Payer: Self-pay | Admitting: Family Medicine

## 2023-01-04 VITALS — BP 122/80 | HR 96 | Temp 98.2°F | Ht 63.0 in | Wt 175.1 lb

## 2023-01-04 DIAGNOSIS — R7303 Prediabetes: Secondary | ICD-10-CM

## 2023-01-04 DIAGNOSIS — R42 Dizziness and giddiness: Secondary | ICD-10-CM

## 2023-01-04 DIAGNOSIS — E782 Mixed hyperlipidemia: Secondary | ICD-10-CM

## 2023-01-04 DIAGNOSIS — F411 Generalized anxiety disorder: Secondary | ICD-10-CM

## 2023-01-04 NOTE — Patient Instructions (Addendum)
-  You have successfully stopped Fluoxetine. If your emotional changes, please follow up sooner.  -EKG was normal with no concerns.  -Orthostatics were positive with changes in your blood pressure. Recommend to increase fluid intake 64-100oz a day, preferable water.  -Suspect your symptoms were related to the back pain. If this occurs again or becomes worse, needs to follow up and may need to be seen at the emergency department.  -Follow up in 6 months for chronic management. Follow up in July (around on or after 23rd) for lab only visit.

## 2023-01-29 ENCOUNTER — Other Ambulatory Visit: Payer: Self-pay | Admitting: Family Medicine

## 2023-01-29 DIAGNOSIS — K219 Gastro-esophageal reflux disease without esophagitis: Secondary | ICD-10-CM

## 2023-02-26 ENCOUNTER — Encounter: Payer: Self-pay | Admitting: Family Medicine

## 2023-03-02 ENCOUNTER — Encounter: Payer: Self-pay | Admitting: Family Medicine

## 2023-03-02 DIAGNOSIS — K219 Gastro-esophageal reflux disease without esophagitis: Secondary | ICD-10-CM

## 2023-03-02 MED ORDER — OMEPRAZOLE 40 MG PO CPDR
40.0000 mg | DELAYED_RELEASE_CAPSULE | Freq: Every day | ORAL | 0 refills | Status: DC
Start: 2023-03-02 — End: 2023-05-30

## 2023-03-02 NOTE — Telephone Encounter (Signed)
Pt was referring to omeprazole not the alprazolam, okay to send under your name or wait for appointment Friday?

## 2023-03-04 NOTE — Progress Notes (Signed)
Acute Office Visit   Subjective:  Patient ID: Kathryn Cummings, female    DOB: 1955/09/27, 67 y.o.   MRN: [id number]  Chief Complaint  Patient presents with   Back Pain   Leg Pain    Pt is here today with back and leg pain(both) 12/30/2022 stated having spasms- hx of arthritis  Pt had some meds she took Pt thinks this is lower back near tail bone , pressure in pelvis.  Pt reports she has issues with bending her legs after walking on concrete.  Pt has seen at orth. [facility] in past and nothing was found.    HPI Patient is coming in for an acute visit for complaints of lower back and bilateral leg pain. Patient reports on 12/30/2022, she started having spasms. She reports she thinks her lower back pain is near her tail bone, and having some pressure in her pelvis. She reports her lower back feels "weird". Denies any back spasms now, But, she feels like she has an intermittent heavy weight in her pelvis. She reports this sensation is occurring daily, multiple times a day. She reports when she is moving around, she does not feel the heaviness as much. She reports she is having trouble with bending her legs at her knees after walking on concrete. Pain has caused her not to sleep as well. She complains of tingling in both her legs, uncertain if it starts at her waist. Constant, worse at night time.   She does have a history of arthritis. She has been seen by [facility] with Dr. Malon Kindle for left knee joint and patellofemoral osteoarthritis. Last visit was in September 2023. She reports she took a 10 day course of Meloxicam 7.5 BID that resolved her left knee pain. She had some tablets left over, she did another 10 day course of Meloxicam 7.5 BID about a month ago on her own. She reports she did get some relief of the pain and tingling.   She reports she has called [facility] to establish an appointment with concerns of this, but reports she was told to go through her primary to have diagnostic  testing done before.   Denies loss of bowel or bladder, numbness/tingling in genital area. Denies abd pain. She reports the pain in her pelvis is more in the back. Denies any recent injuries.   Based on review of records, the last scan she had was a CT lumbar spine without contrast in 2006 for bilateral leg pain, burning, and tingling left greater than right. Reports stated she had left greater than right advanced facet degenerative joint diease with posterior ligamentous calcification L5-S1. Diffuse annular disk bulges maximal at L5-S1 and lesser L4-5 with annular calcification. No significant neural impingement is seen related to those findings.  ROS See HPI above      Objective:   BP 110/64   Pulse 98   Temp 98.5 F (36.9 C)   Ht 5\' 3"  (1.6 m)   Wt 169 lb 6 oz (76.8 kg)   SpO2 98%   BMI 30.00 kg/m     Physical Exam Vitals reviewed.  Constitutional:      General: She is not in acute distress.    Appearance: Normal appearance. She is not ill-appearing, toxic-appearing or diaphoretic.  HENT:     Head: Normocephalic and atraumatic.  Eyes:     General:        Right eye: No discharge.        Left eye:  No discharge.     Conjunctiva/sclera: Conjunctivae normal.  Cardiovascular:     Rate and Rhythm: Normal rate and regular rhythm.     Pulses:          Dorsalis pedis pulses are 3+ on the right side and 3+ on the left side.     Heart sounds: Normal heart sounds. No murmur heard.    No friction rub. No gallop.  Pulmonary:     Effort: Pulmonary effort is normal. No respiratory distress.     Breath sounds: Normal breath sounds.  Abdominal:     General: Abdomen is flat. Bowel sounds are normal. There is no distension.     Palpations: Abdomen is soft.     Tenderness: There is no abdominal tenderness.  Musculoskeletal:     Lumbar back: Bony tenderness (Sacral) present. No swelling, signs of trauma or spasms. Normal range of motion. Positive right straight leg raise test and positive  left straight leg raise test (More on left).     Left lower leg: Edema (trace in foot and ankle) present.     Comments: Able to rotate both hips with some mild irritation at her posterior leg and lower back.   Skin:    General: Skin is warm and dry.  Neurological:     General: No focal deficit present.     Mental Status: She is alert and oriented to person, place, and time. Mental status is at baseline.     Gait: Gait normal.  Psychiatric:        Mood and Affect: Mood normal.        Behavior: Behavior normal.        Thought Content: Thought content normal.        Judgment: Judgment normal.       Assessment & Plan:  Acute bilateral low back pain without sciatica -     predniSONE; Take 6 tablets (60 mg total) by mouth daily with breakfast for 1 day, THEN 5 tablets (50 mg total) daily with breakfast for 1 day, THEN 4 tablets (40 mg total) daily with breakfast for 1 day, THEN 3 tablets (30 mg total) daily with breakfast for 1 day, THEN 2 tablets (20 mg total) daily with breakfast for 1 day, THEN 1 tablet (10 mg total) daily with breakfast for 1 day.  Dispense: 21 tablet; Refill: 0 -     Meloxicam; Take 1 tablet (7.5 mg total) by mouth 2 (two) times daily.  Dispense: 60 tablet; Refill: 0 -     CT LUMBAR SPINE WO CONTRAST; Future  Tingling in extremities -     predniSONE; Take 6 tablets (60 mg total) by mouth daily with breakfast for 1 day, THEN 5 tablets (50 mg total) daily with breakfast for 1 day, THEN 4 tablets (40 mg total) daily with breakfast for 1 day, THEN 3 tablets (30 mg total) daily with breakfast for 1 day, THEN 2 tablets (20 mg total) daily with breakfast for 1 day, THEN 1 tablet (10 mg total) daily with breakfast for 1 day.  Dispense: 21 tablet; Refill: 0 -     Meloxicam; Take 1 tablet (7.5 mg total) by mouth 2 (two) times daily.  Dispense: 60 tablet; Refill: 0 -     CT LUMBAR SPINE WO CONTRAST; Future  -Prescribed Prednisone 10mg  tablet, 6 day taper for symptoms. Then, start  Meloxicam 7.5mg  twice a day for pain since it has helped. Recommend to eat something with taking medication and do not  take additional NSAIDS, such as Ibuprofen, Advil, Aleve, or Naproxen while taking either medication. -Ordered a CT lumbar spine without contrast to compare to the previous CT scan she had in 2006 with similar symptoms. Unable to do a MRI because of being claustrophobia. If CT is denied, will do am x-ray. Do not think a plain film is going to provide any information. No injury. If x-ray has to be completed, negative, then will do a CT scan. If this gets denied, then will refer to ortho.  -Follow up in 1 month to reassess status of symptoms.  -If symptoms become worse or significantly change before then, please follow up sooner.   Return in about 1 month (around 04/05/2023) for follow-up.  Zandra Abts, NP

## 2023-03-05 ENCOUNTER — Ambulatory Visit (INDEPENDENT_AMBULATORY_CARE_PROVIDER_SITE_OTHER): Payer: Medicare Other | Admitting: Family Medicine

## 2023-03-05 ENCOUNTER — Encounter: Payer: Self-pay | Admitting: Family Medicine

## 2023-03-05 VITALS — BP 110/64 | HR 98 | Temp 98.5°F | Ht 63.0 in | Wt 169.4 lb

## 2023-03-05 DIAGNOSIS — M545 Low back pain, unspecified: Secondary | ICD-10-CM

## 2023-03-05 DIAGNOSIS — R202 Paresthesia of skin: Secondary | ICD-10-CM

## 2023-03-05 MED ORDER — MELOXICAM 7.5 MG PO TABS
7.5000 mg | ORAL_TABLET | Freq: Two times a day (BID) | ORAL | 0 refills | Status: DC
Start: 2023-03-05 — End: 2023-04-01

## 2023-03-05 MED ORDER — PREDNISONE 10 MG PO TABS
ORAL_TABLET | ORAL | 0 refills | Status: AC
Start: 2023-03-05 — End: 2023-03-10

## 2023-03-05 NOTE — Patient Instructions (Addendum)
-  Recommend to start Prednisone 10mg  tablet, 6 day taper for symptoms. Then, start Meloxicam 7.5mg  twice a day for pain. Recommend to eat something with taking medication and do not take additional NSAIDS, such as Ibuprofen, Advil, Aleve, or Naproxen while taking either medication. -Ordered a CT lumbar spine without contrast to compare to the previous CT scan she had in 2006 with similar symptoms. Unable to do a MRI because of being claustrophobia.  -Follow up in 1 month to reassess status of symptoms.  -If symptoms become worse or significantly change before then, please follow up sooner.

## 2023-03-14 ENCOUNTER — Ambulatory Visit (HOSPITAL_BASED_OUTPATIENT_CLINIC_OR_DEPARTMENT_OTHER)
Admission: RE | Admit: 2023-03-14 | Discharge: 2023-03-14 | Disposition: A | Discharge status: Home / Self Care | Payer: Medicare Other | Source: Ambulatory Visit | Attending: Family Medicine | Admitting: Family Medicine

## 2023-03-14 DIAGNOSIS — M545 Low back pain, unspecified: Secondary | ICD-10-CM | POA: Insufficient documentation

## 2023-03-14 DIAGNOSIS — R202 Paresthesia of skin: Secondary | ICD-10-CM | POA: Diagnosis present

## 2023-03-16 ENCOUNTER — Telehealth: Payer: Self-pay

## 2023-03-16 NOTE — Telephone Encounter (Signed)
Pt has been notified.

## 2023-03-16 NOTE — Telephone Encounter (Signed)
-----   Message from Zandra Abts sent at 03/16/2023  8:52 AM EDT ----- Your CT lumbar spine shows some moderate degenerative changes, arthritis, of the spine at different levels with no narrowing of spaces between the bones in the spine. Therefore, I would recommend a ortho specialist to start with if you are having continued lumbar pain. I hope the Prednisone and starting the Meloxicam has helped some.

## 2023-03-17 ENCOUNTER — Telehealth: Payer: Self-pay

## 2023-03-17 ENCOUNTER — Other Ambulatory Visit: Payer: Self-pay

## 2023-03-17 ENCOUNTER — Other Ambulatory Visit (INDEPENDENT_AMBULATORY_CARE_PROVIDER_SITE_OTHER): Payer: Medicare Other

## 2023-03-17 ENCOUNTER — Encounter: Payer: Self-pay | Admitting: Family Medicine

## 2023-03-17 ENCOUNTER — Encounter (INDEPENDENT_AMBULATORY_CARE_PROVIDER_SITE_OTHER): Payer: Self-pay

## 2023-03-17 DIAGNOSIS — E7800 Pure hypercholesterolemia, unspecified: Secondary | ICD-10-CM

## 2023-03-17 DIAGNOSIS — R7303 Prediabetes: Secondary | ICD-10-CM

## 2023-03-17 DIAGNOSIS — Z789 Other specified health status: Secondary | ICD-10-CM

## 2023-03-17 DIAGNOSIS — E782 Mixed hyperlipidemia: Secondary | ICD-10-CM | POA: Diagnosis not present

## 2023-03-17 LAB — LDL CHOLESTEROL, DIRECT: Direct LDL: 145 mg/dL

## 2023-03-17 LAB — LIPID PANEL
Cholesterol: 257 mg/dL — ABNORMAL HIGH (ref 0–200)
HDL: 55.4 mg/dL (ref >39.00)
Total CHOL/HDL Ratio: 5
Triglycerides: 407 mg/dL — ABNORMAL HIGH (ref 0.0–149.0)

## 2023-03-17 LAB — HEMOGLOBIN A1C: Hgb A1c MFr Bld: 6.3 % (ref 4.6–6.5)

## 2023-03-17 NOTE — Telephone Encounter (Signed)
-----   Message from Zandra Abts sent at 03/17/2023  2:33 PM EDT ----- Your cholesterol and triglycerides are elevated even with taking Ezetimibe. Recommend a referral to cardiology and lipid clinic since you are unable to tolerate statins due to muscle aches. We need to get this cholesterol lower. Also, I recommend either an over the counter fish oil or we can try to see if insurance will cover Vascepa 2g twice a day. Continue to work on diet, decreasing carbohydrates, sweets, fried, and fatty foods. Continue to participate in regular exercise.  A1c has improved slightly.

## 2023-03-17 NOTE — Telephone Encounter (Signed)
Pt is aware of lab results .  Cardiology and lipid clinic referrals are placed . Pt states she has started taking an OTC of fish oil .

## 2023-03-28 ENCOUNTER — Encounter: Payer: Self-pay | Admitting: Family Medicine

## 2023-04-01 ENCOUNTER — Other Ambulatory Visit: Payer: Self-pay | Admitting: Family Medicine

## 2023-04-01 ENCOUNTER — Encounter (INDEPENDENT_AMBULATORY_CARE_PROVIDER_SITE_OTHER): Payer: Self-pay

## 2023-04-01 DIAGNOSIS — R202 Paresthesia of skin: Secondary | ICD-10-CM

## 2023-04-01 DIAGNOSIS — M545 Low back pain, unspecified: Secondary | ICD-10-CM

## 2023-04-04 NOTE — Progress Notes (Signed)
Cardiology Office Note:  .   Date:  04/13/2023  ID:  Kathryn Cummings, DOB 1955/11/10, MRN [id number] PCP: Alveria Apley, NP  [facility] Providers Cardiologist:  Orbie Pyo, MD   History of Present Illness: .   Kathryn Cummings is a 67 y.o. female with a past medical history of migraines, GERD, history of basal cell carcinoma, osteoarthritis, GAD, hyperlipidemia, statin myopathy.  Patient is followed by Dr. Lynnette Caffey and presents today for evaluation of HLD.   Per chart review, patient was seen by Dr. Lynnette Caffey in 06/2021 for evaluation of chest pain.  Patient had recently been seen by her primary care provider and EKG at her office showed no acute ischemic changes.  For evaluation, patient underwent echocardiogram on 07/01/2021 that showed EF 55-60%, no regional wall motion abnormalities, normal LV diastolic parameters, normal RV function.  Patient also underwent coronary CTA of 07/03/2021 that showed a coronary calcium score of 0 and no evidence of coronary artery disease.  Patient has not been seen by cardiology since that time.  Patient's primary care provider had ordered a lipid panel on 03/17/2023 that showed total cholesterol 257, triglycerides 407, LDL 145.  Patient was intolerant to Crestor in the past.  Patient's PCP recommended referring to cardiology and recommended starting over-the-counter fish oil.  On interview, patient reports that she has been doing very well from a cardiac perspective. She denies chest pain, shortness of breath, dizziness, syncope, near syncope, palpitations, ankle edema. She is here to discuss her cholesterol. In the past, she did not tolerate atorvastatin 40 mg daily, crestor (unknown dose). Denies trying lower doses of atorvastatin in the past. She has been taking zetia for about 6 months and over the counter fish oil for about a month. She does not eat red meats, and has been trying to avoid fried/fatty foods. She rarely drinks alcohol, and only drinks  socially or in social situations.   ROS: Denies chest pain, palpitations, shortness of breath, syncope, near syncope, dizziness   Studies Reviewed: .   Cardiac Studies & Procedures       ECHOCARDIOGRAM  ECHOCARDIOGRAM COMPLETE 07/01/2021  Narrative ECHOCARDIOGRAM REPORT    Patient Name:   Kathryn Cummings    Date of Exam: 07/01/2021 Medical Rec #:  [id number]     Height:       63.3 in Accession #:    [id number]    Weight:       169.4 lb Date of Birth:  [birth year]      BSA:          1.808 m Patient Age:    65 years      BP:           132/80 mmHg Patient Gender: F             HR:           65 bpm. Exam Location:  [address]  Procedure: 2D Echo, 3D Echo, Cardiac Doppler, Color Doppler and Strain Analysis  Indications:    R07.9 Chest pain  History:        Patient has no prior history of Echocardiogram examinations.  Sonographer:    Daphine Deutscher RDCS Referring Phys: [id number] Orbie Pyo  IMPRESSIONS   1. Left ventricular ejection fraction, by estimation, is 55 to 60%. Left ventricular ejection fraction by 3D volume is 57 %. The left ventricle has normal function. The left ventricle has no regional wall motion abnormalities. Left ventricular diastolic parameters were normal. The  average left ventricular global longitudinal strain is -22.5 %. The global longitudinal strain is normal. 2. Right ventricular systolic function is normal. The right ventricular size is normal. 3. The mitral valve is normal in structure. No evidence of mitral valve regurgitation. No evidence of mitral stenosis. 4. The aortic valve is normal in structure. Aortic valve regurgitation is not visualized. No aortic stenosis is present. 5. The inferior vena cava is normal in size with greater than 50% respiratory variability, suggesting right atrial pressure of 3 mmHg.  FINDINGS Left Ventricle: Left ventricular ejection fraction, by estimation, is 55 to 60%. Left ventricular ejection fraction by 3D  volume is 57 %. The left ventricle has normal function. The left ventricle has no regional wall motion abnormalities. The average left ventricular global longitudinal strain is -22.5 %. The global longitudinal strain is normal. The left ventricular internal cavity size was normal in size. There is no left ventricular hypertrophy. Left ventricular diastolic parameters were normal. Normal left ventricular filling pressure.  Right Ventricle: The right ventricular size is normal. No increase in right ventricular wall thickness. Right ventricular systolic function is normal.  Left Atrium: Left atrial size was normal in size.  Right Atrium: Right atrial size was normal in size.  Pericardium: There is no evidence of pericardial effusion.  Mitral Valve: The mitral valve is normal in structure. No evidence of mitral valve regurgitation. No evidence of mitral valve stenosis.  Tricuspid Valve: The tricuspid valve is normal in structure. Tricuspid valve regurgitation is not demonstrated. No evidence of tricuspid stenosis.  Aortic Valve: The aortic valve is normal in structure. Aortic valve regurgitation is not visualized. No aortic stenosis is present.  Pulmonic Valve: The pulmonic valve was normal in structure. Pulmonic valve regurgitation is not visualized. No evidence of pulmonic stenosis.  Aorta: The aortic root is normal in size and structure.  Venous: The inferior vena cava is normal in size with greater than 50% respiratory variability, suggesting right atrial pressure of 3 mmHg.  IAS/Shunts: No atrial level shunt detected by color flow Doppler.   LEFT VENTRICLE PLAX 2D LVIDd:         4.20 cm   Diastology LVIDs:         2.70 cm   LV e' medial:    8.08 cm/s LV PW:         0.80 cm   LV E/e' medial:  9.1 LV IVS:        0.80 cm   LV e' lateral:   12.00 cm/s LVOT diam:     1.80 cm   LV E/e' lateral: 6.1 LV SV:         51 LV SV Index:   28        2D Longitudinal Strain LVOT Area:     2.54  cm  2D Strain GLS (A2C):   -22.3 % 2D Strain GLS (A3C):   -22.3 % 2D Strain GLS (A4C):   -23.0 % 2D Strain GLS Avg:     -22.5 %  3D Volume EF: 3D EF:        57 % LV EDV:       97 ml LV ESV:       41 ml LV SV:        56 ml  RIGHT VENTRICLE RV Basal diam:  3.70 cm RV S prime:     16.30 cm/s TAPSE (M-mode): 2.2 cm  LEFT ATRIUM  Index        RIGHT ATRIUM          Index LA diam:        3.40 cm 1.88 cm/m   RA Area:     9.10 cm LA Vol (A2C):   43.3 ml 23.95 ml/m  RA Volume:   20.30 ml 11.23 ml/m LA Vol (A4C):   25.0 ml 13.83 ml/m LA Biplane Vol: 34.5 ml 19.09 ml/m AORTIC VALVE LVOT Vmax:   95.10 cm/s LVOT Vmean:  62.600 cm/s LVOT VTI:    0.202 m  AORTA Ao Root diam: 3.10 cm Ao Asc diam:  3.20 cm  MITRAL VALVE MV Area (PHT): 3.65 cm    SHUNTS MV Decel Time: 208 msec    Systemic VTI:  0.20 m MV E velocity: 73.40 cm/s  Systemic Diam: 1.80 cm MV A velocity: 85.90 cm/s MV E/A ratio:  0.85  Mihai Croitoru MD Electronically signed by Thurmon Fair MD Signature Date/Time: 07/01/2021/6:09:48 PM    Final     CT SCANS  CT CORONARY MORPH W/CTA COR W/SCORE 07/03/2021  Addendum 07/03/2021  3:13 PM ADDENDUM REPORT: 07/03/2021 15:11  CLINICAL DATA:  67 year old with chest pain  EXAM: Cardiac/Coronary  CTA  TECHNIQUE: The patient was scanned on a [employer].  FINDINGS: A 100 kV prospective scan was triggered in the descending thoracic aorta at 111 HU's. Axial non-contrast 3 mm slices were carried out through the heart. The data set was analyzed on a dedicated work station and scored using the Agatson method. Gantry rotation speed was 250 msecs and collimation was .6 mm. 0.8 mg of sl NTG was given. The 3D data set was reconstructed in 5% intervals of the 67-82 % of the R-R cycle. Diastolic phases were analyzed on a dedicated work station using MPR, MIP and VRT modes. The patient received 80 cc of contrast.  Aorta:  Normal size.  No  calcifications.  No dissection.  Aortic Valve:  Trileaflet.  No calcifications.  Coronary Arteries:  Normal coronary origin.  Right dominance.  RCA is a large dominant artery that gives rise to PDA and PLA. There is no plaque.  Left main is a large artery that gives rise to LAD and LCX arteries.  LAD is a large vessel that has no plaque.  LCX is a non-dominant artery that gives rise to one large OM1 branch. There is no plaque.  Other findings:  Normal pulmonary vein drainage into the left atrium.  Normal left atrial appendage without a thrombus.  Normal size of the pulmonary artery.  Please see radiology report for non cardiac findings.  IMPRESSION: 1. Coronary calcium score of 0. This was 0 percentile for age and sex matched control.  2. Normal coronary origin with right dominance.  3. No evidence of CAD.  CAD-RADS 0. No evidence of CAD (0%). Consider non-atherosclerotic causes of chest pain.   Electronically Signed By: Donato Schultz M.D. On: 07/03/2021 15:11  Narrative EXAM: OVER-READ INTERPRETATION  CT CHEST  The following report is an over-read performed by radiologist Dr. Signa Kell of [facility] on 07/03/2021. This over-read does not include interpretation of cardiac or coronary anatomy or pathology. The coronary CTA. Interpretation by the cardiologist is attached.  COMPARISON:  None.  FINDINGS: Mediastinum/Nodes: No mediastinal mass or adenopathy identified.  Lungs/Pleura: No pleural effusion, airspace consolidation or pneumothorax. No suspicious lung nodules.  Upper Abdomen: No acute abnormality.  Small hiatal hernia.  Musculoskeletal: No chest wall mass  or suspicious bone lesions identified. Degenerative disc disease within the visualized portions of the lower thoracic spine.  IMPRESSION: 1. No active cardiopulmonary abnormalities. 2. Small hiatal hernia.  Electronically Signed: By: Signa Kell M.D. On: 07/03/2021  13:12           Risk Assessment/Calculations:     HYPERTENSION CONTROL Vitals:   04/13/23 1000 04/13/23 1031  BP: (!) 150/93 (!) 138/92    The patient's blood pressure is elevated above target today.  In order to address the patient's elevated BP: Blood pressure will be monitored at home to determine if medication changes need to be made.          Physical Exam:   VS:  BP (!) 138/92   Pulse 62   Ht 5\' 3"  (1.6 m)   Wt 171 lb (77.6 kg)   SpO2 97%   BMI 30.29 kg/m    Wt Readings from Last 3 Encounters:  04/13/23 171 lb (77.6 kg)  03/05/23 169 lb 6 oz (76.8 kg)  01/04/23 175 lb 2 oz (79.4 kg)    GEN: Well nourished, well developed in no acute distress. Sitting comfortably on the exam table  NECK: No JVD CARDIAC: RRR, no murmurs, rubs, gallops. Radial pulses 2+ bilaterally  RESPIRATORY:  Clear to auscultation without rales, wheezing or rhonchi. Normal work of breathing on room air   ABDOMEN: Soft, non-tender, non-distended EXTREMITIES:  No edema in BLE; No deformity   ASSESSMENT AND PLAN: .    Mixed HLD  - Patient's lipid panel from 03/17/23 showed LDL 145, HDL 55, triglycerides 407 - In the past, patient had muscle aches with crestor (unknown dose) and atorvastatin 40 mg daily. Denies trying lower doses of atorvastatin  - Discussed trying low-dose atorvastatin vs referral to the lipid clinic for PCSK9I. Patient would prefer to avoid injectables at this time - Start atorvastatin 10 mg daily - instructed patient to take at night  - Start Coenzyme Q10 - If patient does not tolerate low dose atorvastatin, will refer to pharmD for PCSK9i   - Discussed dietary changes including reducing intake of simple carbohydrates, fried/fast food, red meats. Encouraged her to try to eat fish that are high in omega-3 fatty acids  - Given her elevated triglycerides, also discussed limiting her alcohol intake to 1 drink per day - Patient is taking OTC fish oil  Elevated BP  - Patient denies  history of elevated BP - In office, initial BP 150/93. On my recheck, 138/92 - Her past few office-recorded vital signs were within normal limits- was 110/64 at her PCP in 02/2023 - Discussed low-sodium diets and increasing activity as tolerated - Provided BP log with instructions to send it back in 2 weeks   Prediabetes - Most recent A1C 6.3 - Followed by PCP     Dispo: Follow up in 6-8 weeks with APP   Signed, Jonita Albee, PA-C

## 2023-04-05 ENCOUNTER — Ambulatory Visit: Payer: Medicare Other | Admitting: Family Medicine

## 2023-04-13 ENCOUNTER — Ambulatory Visit: Payer: Medicare Other | Attending: Cardiology | Admitting: Cardiology

## 2023-04-13 ENCOUNTER — Encounter: Payer: Self-pay | Admitting: Cardiology

## 2023-04-13 VITALS — BP 138/92 | HR 62 | Ht 63.0 in | Wt 171.0 lb

## 2023-04-13 DIAGNOSIS — E782 Mixed hyperlipidemia: Secondary | ICD-10-CM | POA: Diagnosis present

## 2023-04-13 DIAGNOSIS — R7303 Prediabetes: Secondary | ICD-10-CM | POA: Insufficient documentation

## 2023-04-13 DIAGNOSIS — R030 Elevated blood-pressure reading, without diagnosis of hypertension: Secondary | ICD-10-CM | POA: Insufficient documentation

## 2023-04-13 DIAGNOSIS — Z136 Encounter for screening for cardiovascular disorders: Secondary | ICD-10-CM | POA: Diagnosis present

## 2023-04-13 MED ORDER — ATORVASTATIN CALCIUM 10 MG PO TABS
10.0000 mg | ORAL_TABLET | Freq: Every day | ORAL | 3 refills | Status: DC
Start: 2023-04-13 — End: 2024-04-26
  Filled 2023-10-12 – 2023-10-27 (×2): qty 90, 90d supply, fill #0
  Filled 2024-01-31: qty 90, 90d supply, fill #1

## 2023-04-13 NOTE — Patient Instructions (Addendum)
Medication Instructions:  Start Atorvastatin 10 mg one tab nightly. Start OTC COQ10- follow instructions on the bottle.  *If you need a refill on your cardiac medications before your next appointment, please call your pharmacy*   Lab Work: NONE ordered at this time of appointment     Testing/Procedures: NONE ordered at this time of appointment     Follow-Up: At [facility], you and your health needs are our priority.  As part of our continuing mission to provide you with exceptional heart care, we have created designated Provider Care Teams.  These Care Teams include your primary Cardiologist (physician) and Advanced Practice Providers (APPs -  Physician Assistants and Nurse Practitioners) who all work together to provide you with the care you need, when you need it.  We recommend signing up for the patient portal called "MyChart".  Sign up information is provided on this After Visit Summary.  MyChart is used to connect with patients for Virtual Visits (Telemedicine).  Patients are able to view lab/test results, encounter notes, upcoming appointments, etc.  Non-urgent messages can be sent to your provider as well.   To learn more about what you can do with MyChart, go to [email or web address].    Your next appointment:   6-8 week(s)  Provider:   Any APP   Other Instructions

## 2023-05-03 ENCOUNTER — Telehealth: Payer: Self-pay

## 2023-05-03 ENCOUNTER — Encounter: Payer: Self-pay | Admitting: Family Medicine

## 2023-05-03 ENCOUNTER — Other Ambulatory Visit: Payer: Self-pay

## 2023-05-03 ENCOUNTER — Ambulatory Visit (HOSPITAL_BASED_OUTPATIENT_CLINIC_OR_DEPARTMENT_OTHER)
Admission: RE | Admit: 2023-05-03 | Discharge: 2023-05-03 | Disposition: A | Discharge status: Home / Self Care | Payer: Medicare Other | Source: Ambulatory Visit | Attending: Family Medicine | Admitting: Family Medicine

## 2023-05-03 ENCOUNTER — Ambulatory Visit (INDEPENDENT_AMBULATORY_CARE_PROVIDER_SITE_OTHER): Payer: Medicare Other | Admitting: Family Medicine

## 2023-05-03 VITALS — BP 122/84 | HR 74 | Temp 98.4°F | Ht 63.0 in | Wt 169.4 lb

## 2023-05-03 DIAGNOSIS — R109 Unspecified abdominal pain: Secondary | ICD-10-CM | POA: Diagnosis present

## 2023-05-03 DIAGNOSIS — R350 Frequency of micturition: Secondary | ICD-10-CM | POA: Diagnosis not present

## 2023-05-03 DIAGNOSIS — Z87442 Personal history of urinary calculi: Secondary | ICD-10-CM | POA: Insufficient documentation

## 2023-05-03 LAB — POCT URINALYSIS DIPSTICK
Bilirubin, UA: NEGATIVE
Blood, UA: NEGATIVE
Glucose, UA: NEGATIVE
Ketones, UA: NEGATIVE
Leukocytes, UA: NEGATIVE
Nitrite, UA: NEGATIVE
Protein, UA: NEGATIVE
Spec Grav, UA: 1.01 (ref 1.010–1.025)
Urobilinogen, UA: 0.2 U/dL
pH, UA: 6 (ref 5.0–8.0)

## 2023-05-03 MED ORDER — METHOCARBAMOL 500 MG PO TABS
500.0000 mg | ORAL_TABLET | Freq: Three times a day (TID) | ORAL | 0 refills | Status: AC
Start: 2023-05-03 — End: 2023-05-10

## 2023-05-03 NOTE — Progress Notes (Signed)
Acute Office Visit   Subjective:  Patient ID: Maelea Molstad, female    DOB: [birth year], 67 y.o.   MRN: [id number]  Chief Complaint  Patient presents with   Urinary Frequency    Pt reports pain in mid left back Pt reports she moved some furniture Saturday and woke up Sunday with pain. Sharp pain stabbing. Pt reports she noticed she has had some urine frequency today. Pt reports kidney stone is 1983  Pt reports she has Ortho appt. For knees    Urinary Frequency  Associated symptoms include frequency.   Patient is her for an acute visit. She is complaining of mid right back pain. She reports on Saturday she moved some furniture. On Saturday, her back pain started. She is unsure if her back pain started before or after moving furniture. She reports on Saturday she moved around a lot and was busy.  She describes pain as intermittent stabbing and sharp. Pain come frequently. Denies any radiating pain. Then, today she noticed some increase in urine frequency today. She reports this pain is different from her usual back pain, from muscle strain, because it feels like something is moving, not constant, and reminds her a little of how she had pain previously for a kidney stone.   Denies any loss of bowel or bladder, denies any numbness or tingling. Denies abd pain, nausea, vomiting, or fever.   Review of Systems  Genitourinary:  Positive for frequency.   See HPI above     Objective:   BP 122/84   Pulse 74   Temp 98.4 F (36.9 C)   Ht 5\' 3"  (1.6 m)   Wt 169 lb 6 oz (76.8 kg)   SpO2 99%   BMI 30.00 kg/m    Physical Exam Vitals reviewed.  Constitutional:      General: She is not in acute distress.    Appearance: Normal appearance. She is ill-appearing (Appears in a little pain). She is not toxic-appearing or diaphoretic.  HENT:     Head: Normocephalic and atraumatic.  Eyes:     General:        Right eye: No discharge.        Left eye: No discharge.     Conjunctiva/sclera:  Conjunctivae normal.  Cardiovascular:     Rate and Rhythm: Normal rate and regular rhythm.     Heart sounds: Normal heart sounds. No murmur heard.    No friction rub. No gallop.  Pulmonary:     Effort: Pulmonary effort is normal. No respiratory distress.     Breath sounds: Normal breath sounds.  Abdominal:     General: Abdomen is flat. Bowel sounds are normal. There is no distension.     Palpations: Abdomen is soft. There is no mass.     Tenderness: There is no abdominal tenderness. There is right CVA tenderness. There is no left CVA tenderness.  Musculoskeletal:        General: Normal range of motion.     Thoracic back: No tenderness.     Lumbar back: No tenderness.  Skin:    General: Skin is warm and dry.  Neurological:     General: No focal deficit present.     Mental Status: She is alert and oriented to person, place, and time. Mental status is at baseline.  Psychiatric:        Mood and Affect: Mood normal.        Behavior: Behavior normal.        Thought  Content: Thought content normal.        Judgment: Judgment normal.    Results for orders placed or performed in visit on 05/03/23  POCT urinalysis dipstick  Result Value Ref Range   Color, UA Clear    Clarity, UA     Glucose, UA Negative Negative   Bilirubin, UA neg    Ketones, UA neg    Spec Grav, UA 1.010 1.010 - 1.025   Blood, UA neg    pH, UA 6.0 5.0 - 8.0   Protein, UA Negative Negative   Urobilinogen, UA 0.2 0.2 or 1.0 E.U./dL   Nitrite, UA neg    Leukocytes, UA Negative Negative   Appearance     Odor         Assessment & Plan:  Urine frequency -     POCT urinalysis dipstick  Acute right flank pain -     CT RENAL STONE STUDY; Future  History of kidney stones -     CT RENAL STONE STUDY; Future  Abdominal pain, unspecified abdominal location -     CT RENAL STONE STUDY; Future  -Urinalysis completed for increase urine frequency. Urinalysis was negative, no abnormal findings.  -Ordered STAT CT  renal stone study due to having acute right flank pain with a history of kidney stones. Also, reports this pain is different than a muscle strain she has had previously and more like her previous kidney stone. Advised to please go directly to [facility]. You are being worked into their schedule and you may have to wait a little bit. Once scan results are received, will notify you of results.   No follow-ups on file.  Zandra Abts, NP

## 2023-05-03 NOTE — Telephone Encounter (Signed)
-----   Message from Zandra Abts sent at 05/03/2023  2:14 PM EDT ----- Your CT scan does not show any acute abdominal or pelvic reason for pain. No kidney stones present. Diverticulosis without your bowel being inflamed. Based on this good report, suspect your pain is from musculoskeletal. Recommend Robaxin 500mg  tablet, 1 tablet every 8 hours for 7 days. (Dispense: 21 tablets. Dx: Right flank pain) Medication can cause drowsiness. Follow up if symptoms change or become worse.

## 2023-05-03 NOTE — Patient Instructions (Addendum)
-  Urinalysis completed for increase urine frequency. Urinalysis was negative, no abnormal findings.  -Ordered STAT CT renal stone study due to having acute right flank pain with a history of kidney stones. Please go directly to [facility]. You are being worked into their schedule and you may have to wait a little bit. Once scan results are received, will notify you of results.

## 2023-05-04 ENCOUNTER — Encounter (INDEPENDENT_AMBULATORY_CARE_PROVIDER_SITE_OTHER): Payer: Medicare Other | Admitting: Family Medicine

## 2023-05-04 NOTE — Progress Notes (Signed)
error 

## 2023-05-21 NOTE — Progress Notes (Unsigned)
Cardiology Clinic Note   Patient Name: Jyssica Rief Date of Encounter: 05/25/2023  Primary Care Provider:  Alveria Apley, NP Primary Cardiologist:  Orbie Pyo, MD  Patient Profile    Alliana Mcauliff 67 year old female presents to the clinic today for follow-up evaluation of her hyperlipidemia and chest pain.  Past Medical History    Past Medical History:  Diagnosis Date   Anxiety    Arthritis    Basal cell adenocarcinoma    Diverticulosis    Diverticulosis    Elevated cholesterol    GERD without esophagitis    Heartburn    Hiatal hernia    History of chicken pox    Insomnia    Kidney stone    1983   Migraine headache    Osteoarthritis    Lumbar spine; hands; feet   PONV (postoperative nausea and vomiting)    Prediabetes    UTI (lower urinary tract infection)    Past Surgical History:  Procedure Laterality Date   ABDOMINAL HYSTERECTOMY  1988   APPENDECTOMY  1988   BASAL CELL CARCINOMA EXCISION     Multiple   BLADDER REPAIR  2000   Mesh   BREAST BIOPSY  1990   CHOLECYSTECTOMY     EXCISION HAGLUND'S DEFORMITY WITH ACHILLES TENDON REPAIR Right 10/30/2021   Procedure: Excision of haglund deformity, right Achilles tendon reconstruction;  Surgeon: Toni Arthurs, MD;  Location: [facility];  Service: Orthopedics;  Laterality: Right;   GASTROC RECESSION EXTREMITY Right 10/30/2021   Procedure: Right gastroc recession;  Surgeon: Toni Arthurs, MD;  Location: [facility];  Service: Orthopedics;  Laterality: Right;    Allergies  Allergies  Allergen Reactions   Other Rash    Bactrium     History of Present Illness    Tanaysia Bhardwaj has a PMH of hyperlipidemia, insomnia, prediabetes, chest discomfort, statin myopathy, GERD, and generalized anxiety disorder.  She was seen by Dr. Lynnette Caffey 11/22 for evaluation of chest discomfort.  She had been seen by her PCP who ordered an EKG.  Echocardiogram 11/22 was ordered and showed normal LVEF no  regional wall motion abnormalities, normal LV parameters and no RV dysfunction.  She had coronary CTA 11/22 which showed coronary calcium score of 0.  Lipid panel was ordered 7/24 which showed cholesterol 257 and an LDL cholesterol of 145.  She is intolerant of rosuvastatin.  Her PCP referred to cardiology for recommendations and started over-the-counter fish oil.  She was seen by Robet Leu 04/13/2023.  She was doing well from a cardiac standpoint.  She denied chest pain shortness of breath, palpitations, lower extremity swelling.  Her cholesterol was reviewed.  She did not tolerate atorvastatin either.  She denied trying lower dose of a atorvastatin.  She had been taking ezetimibe for about 6 months.  She was also taking over-the-counter fish oil for 1 month.  She was not eating red meat and was trying to avoid fried and fatty foods.  She was rarely drinking alcohol and was only drinking socially.  Low-dose atorvastatin versus referral to lipid clinic was discussed.  She was started on atorvastatin 10 mg daily and co-Q10.  Recommendation for referral to pharmacy lipid clinic for PCSK9 inhibitor was made if statin intolerant.  She presents to the clinic today for follow-up evaluation states she has been taking a different fish oil with more omega-3's for the last 2 weeks.  She has been taking co-Q10 ezetimibe and continues to take Lipitor 10  mg daily.  She is tolerating this well.  I will plan for repeat fasting lipids and LFTs the week before Thanksgiving, have her increase her physical activity as tolerated, continue her current diet, and plan follow-up as needed..  Today's she denies chest pain, shortness of breath, lower extremity edema, fatigue, palpitations, melena, hematuria, hemoptysis, diaphoresis, weakness, presyncope, syncope, orthopnea, and PND.    Home Medications    Prior to Admission medications   Medication Sig Start Date End Date Taking? Authorizing Provider  ALPRAZolam Prudy Feeler)  0.5 MG tablet Take 1 tablet (0.5 mg total) by mouth 2 (two) times daily as needed for anxiety. 06/17/22   [place], Betty G, MD  atorvastatin (LIPITOR) 10 MG tablet Take 1 tablet (10 mg total) by mouth daily. 04/13/23   Jonita Albee, PA-C  cetirizine (ZYRTEC) 10 MG chewable tablet Chew 10 mg by mouth daily as needed for allergies.    [provider]  ezetimibe (ZETIA) 10 MG tablet Take 1 tablet (10 mg total) by mouth daily. 12/07/22   Alveria Apley, NP  meloxicam (MOBIC) 7.5 MG tablet TAKE 1 TABLET BY MOUTH 2 TIMES DAILY. Patient not taking: Reported on 05/03/2023 04/01/23   Alveria Apley, NP  Nutritional Supplements (JUICE PLUS FIBRE) LIQD Take by mouth daily.    [provider]  Omega-3 Fatty Acids (FISH OIL) 1000 MG CAPS Take 1 capsule by mouth daily.    [provider]  omeprazole (PRILOSEC) 40 MG capsule Take 1 capsule (40 mg total) by mouth daily. 03/02/23   Alveria Apley, NP  SUMAtriptan (IMITREX) 100 MG tablet TAKE 1 TABLET BY MOUTH AS NEEDED FOR HEADACHE. MAY REPEAT IN 2 HOURS IF HEADACHE PERSISTS OR RECURS 05/10/15   Waldon Merl, PA-C    Family History    Family History  Problem Relation Age of Onset   Arthritis-Osteo Mother        Living   Diabetes Mother    Hyperlipidemia Mother    Hypertension Mother    Arthritis-Osteo Father        Living   Diabetes Father    Hypertension Father    Cancer Sister        Skin   Arthritis Sister    Healthy Sister        x1   Arthritis Brother    Cancer Brother        Skin   Healthy Brother        x1   Healthy Son        x2   Breast cancer Maternal Aunt    Heart attack Maternal Grandmother    Alzheimer's disease Maternal Grandmother    Heart attack Maternal Grandfather    Dementia Paternal Grandmother    Cancer Paternal Grandfather        Liver   Pancreatic cancer Paternal Grandfather    Colon cancer Neg Hx    Colon polyps Neg Hx    Liver disease Neg Hx    Esophageal cancer  Neg Hx    Stomach cancer Neg Hx    Rectal cancer Neg Hx    She indicated that her mother is deceased. She indicated that her father is deceased. She indicated that her sister is alive. She indicated that her brother is alive. She indicated that the status of her maternal grandmother is unknown. She indicated that the status of her maternal grandfather is unknown. She indicated that the status of her paternal grandmother is unknown. She indicated that  the status of her paternal grandfather is unknown. She indicated that only one of her two sons is alive. She indicated that the status of her maternal aunt is unknown. She indicated that the status of her paternal aunt is unknown. She indicated that the status of her neg hx is unknown.  Social History    Social History   Socioeconomic History   Marital status: Married    Spouse name: Not on file   Number of children: 2   Years of education: college   Highest education level: Associate degree: academic program  Occupational History   Not on file  Tobacco Use   Smoking status: Never   Smokeless tobacco: Never  Vaping Use   Vaping status: Never Used  Substance and Sexual Activity   Alcohol use: Yes    Alcohol/week: 2.0 standard drinks of alcohol    Types: 2 Standard drinks or equivalent per week    Comment: 2 times a month wine/beer   Drug use: No   Sexual activity: Yes    Partners: Male  Other Topics Concern   Not on file  Social History Narrative   Not on file   Social Determinants of Health   Financial Resource Strain: Low Risk  (01/01/2023)   Overall Financial Resource Strain (CARDIA)    Difficulty of Paying Living Expenses: Not hard at all  Food Insecurity: No Food Insecurity (01/01/2023)   Hunger Vital Sign    Worried About Running Out of Food in the Last Year: Never true    Ran Out of Food in the Last Year: Never true  Transportation Needs: No Transportation Needs (01/01/2023)   PRAPARE - Scientist, research (physical sciences) (Medical): No    Lack of Transportation (Non-Medical): No  Physical Activity: Insufficiently Active (01/01/2023)   Exercise Vital Sign    Days of Exercise per Week: 2 days    Minutes of Exercise per Session: 10 min  Stress: No Stress Concern Present (01/01/2023)   [employer] of Occupational Health - Occupational Stress Questionnaire    Feeling of Stress : Only a little  Social Connections: Socially Integrated (01/01/2023)   Social Connection and Isolation Panel [NHANES]    Frequency of Communication with Friends and Family: More than three times a week    Frequency of Social Gatherings with Friends and Family: Three times a week    Attends Religious Services: More than 4 times per year    Active Member of Clubs or Organizations: Yes    Attends Banker Meetings: More than 4 times per year    Marital Status: Married  Catering manager Violence: Not At Risk (12/07/2022)   Humiliation, Afraid, Rape, and Kick questionnaire    Fear of Current or Ex-Partner: No    Emotionally Abused: No    Physically Abused: No    Sexually Abused: No     Review of Systems    General:  No chills, fever, night sweats or weight changes.  Cardiovascular:  No chest pain, dyspnea on exertion, edema, orthopnea, palpitations, paroxysmal nocturnal dyspnea. Dermatological: No rash, lesions/masses Respiratory: No cough, dyspnea Urologic: No hematuria, dysuria Abdominal:   No nausea, vomiting, diarrhea, bright red blood per rectum, melena, or hematemesis Neurologic:  No visual changes, wkns, changes in mental status. All other systems reviewed and are otherwise negative except as noted above.  Physical Exam    VS:  BP 100/70 (BP Location: Left Arm, Patient Position: Sitting, Cuff Size: Normal)   Pulse 84  Ht 5\' 3"  (1.6 m)   Wt 169 lb (76.7 kg)   SpO2 96%   BMI 29.94 kg/m  , BMI Body mass index is 29.94 kg/m. GEN: Well nourished, well developed, in no acute distress. HEENT:  normal. Neck: Supple, no JVD, carotid bruits, or masses. Cardiac: RRR, no murmurs, rubs, or gallops. No clubbing, cyanosis, edema.  Radials/DP/PT 2+ and equal bilaterally.  Respiratory:  Respirations regular and unlabored, clear to auscultation bilaterally. GI: Soft, nontender, nondistended, BS + x 4. MS: no deformity or atrophy. Skin: warm and dry, no rash. Neuro:  Strength and sensation are intact. Psych: Normal affect.  Accessory Clinical Findings    Recent Labs: 12/08/2022: ALT 18; BUN 16; Creatinine, Ser 0.90; Potassium 4.3; Sodium 136   Recent Lipid Panel    Component Value Date/Time   CHOL 257 (H) 03/17/2023 0825   TRIG (H) 03/17/2023 0825    407.0 Triglyceride is over 400; calculations on Lipids are invalid.   HDL 55.40 03/17/2023 0825   CHOLHDL 5 03/17/2023 0825   VLDL 77.2 (H) 12/08/2022 0806   LDLCALC 154 (H) 06/18/2020 1042   LDLDIRECT 145.0 03/17/2023 0825         ECG personally reviewed by me today-none today.    Echocardiogram 07/01/2021  IMPRESSIONS     1. Left ventricular ejection fraction, by estimation, is 55 to 60%. Left  ventricular ejection fraction by 3D volume is 57 %. The left ventricle has  normal function. The left ventricle has no regional wall motion  abnormalities. Left ventricular diastolic   parameters were normal. The average left ventricular global longitudinal  strain is -22.5 %. The global longitudinal strain is normal.   2. Right ventricular systolic function is normal. The right ventricular  size is normal.   3. The mitral valve is normal in structure. No evidence of mitral valve  regurgitation. No evidence of mitral stenosis.   4. The aortic valve is normal in structure. Aortic valve regurgitation is  not visualized. No aortic stenosis is present.   5. The inferior vena cava is normal in size with greater than 50%  respiratory variability, suggesting right atrial pressure of 3 mmHg.   FINDINGS   Left Ventricle: Left  ventricular ejection fraction, by estimation, is 55  to 60%. Left ventricular ejection fraction by 3D volume is 57 %. The left  ventricle has normal function. The left ventricle has no regional wall  motion abnormalities. The average  left ventricular global longitudinal strain is -22.5 %. The global  longitudinal strain is normal. The left ventricular internal cavity size  was normal in size. There is no left ventricular hypertrophy. Left  ventricular diastolic parameters were normal.  Normal left ventricular filling pressure.   Right Ventricle: The right ventricular size is normal. No increase in  right ventricular wall thickness. Right ventricular systolic function is  normal.   Left Atrium: Left atrial size was normal in size.   Right Atrium: Right atrial size was normal in size.   Pericardium: There is no evidence of pericardial effusion.   Mitral Valve: The mitral valve is normal in structure. No evidence of  mitral valve regurgitation. No evidence of mitral valve stenosis.   Tricuspid Valve: The tricuspid valve is normal in structure. Tricuspid  valve regurgitation is not demonstrated. No evidence of tricuspid  stenosis.   Aortic Valve: The aortic valve is normal in structure. Aortic valve  regurgitation is not visualized. No aortic stenosis is present.   Pulmonic Valve: The pulmonic  valve was normal in structure. Pulmonic valve  regurgitation is not visualized. No evidence of pulmonic stenosis.   Aorta: The aortic root is normal in size and structure.   Venous: The inferior vena cava is normal in size with greater than 50%  respiratory variability, suggesting right atrial pressure of 3 mmHg.   IAS/Shunts: No atrial level shunt detected by color flow Doppler.        Assessment & Plan   1.  Hyperlipidemia-intolerant of high-dose atorvastatin and rosuvastatin.  Noted myalgia this.  Tolerating low-dose atorvastatin well. Continue atorvastatin, co-Q10, omega-3 fatty  acids Repeat fasting lipids and LFTs-week before Thanksgiving repeat High-fiber diet  Essential hypertension-BP today 100/70. Maintain blood pressure log Increase physical activity as tolerated Low-sodium diet  Prediabetes-glucose 100 on 12/08/2022. A1c 6.3 03/17/23 Avoid simple carbohydrates Increase physical activity as tolerated Follows with PCP  Disposition: Follow-up with Dr.Thukkani or me PRN.    Thomasene Ripple. Carrieanne Kleen NP-C     05/25/2023, 2:36 PM [facility] [address] Office [phone] Fax [phone]    I spent 14 minutes examining this patient, reviewing medications, and using patient centered shared decision making involving her cardiac care.  Prior to her visit I spent greater than 20 minutes reviewing her past medical history,  medications, and prior cardiac tests.

## 2023-05-25 ENCOUNTER — Encounter: Payer: Self-pay | Admitting: General Practice

## 2023-05-25 ENCOUNTER — Ambulatory Visit: Payer: Medicare Other | Attending: General Practice | Admitting: General Practice

## 2023-05-25 VITALS — BP 100/70 | HR 84 | Ht 63.0 in | Wt 169.0 lb

## 2023-05-25 DIAGNOSIS — R030 Elevated blood-pressure reading, without diagnosis of hypertension: Secondary | ICD-10-CM | POA: Insufficient documentation

## 2023-05-25 DIAGNOSIS — E782 Mixed hyperlipidemia: Secondary | ICD-10-CM | POA: Diagnosis present

## 2023-05-25 DIAGNOSIS — R7303 Prediabetes: Secondary | ICD-10-CM | POA: Insufficient documentation

## 2023-05-25 NOTE — Patient Instructions (Addendum)
Medication Instructions:  The current medical regimen is effective;  continue present plan and medications as directed. Please refer to the Current Medication list given to you today.  *If you need a refill on your cardiac medications before your next appointment, please call your pharmacy*  Lab Work: FASTING LIPID AND LFT MID NOVEMBER (11-18 THRU 11-22) If you have labs (blood work) drawn today and your tests are completely normal, you will receive your results only by:  MyChart Message (if you have MyChart) OR  A paper copy in the mail If you have any lab test that is abnormal or we need to change your treatment, we will call you to review the results.  Other Instructions CONTINUE DIET AND EXERCISE INCREASE PHYSICAL ACTIVITY-AS TOLERATED  Follow-Up: At [facility], you and your health needs are our priority.  As part of our continuing mission to provide you with exceptional heart care, we have created designated Provider Care Teams.  These Care Teams include your primary Cardiologist (physician) and Advanced Practice Providers (APPs -  Physician Assistants and Nurse Practitioners) who all work together to provide you with the care you need, when you need it.  Your next appointment:   FOLLOW UP AS NEEDED   Provider:   Orbie Pyo, MD  or Edd Fabian, FNP

## 2023-05-30 ENCOUNTER — Other Ambulatory Visit: Payer: Self-pay | Admitting: Family Medicine

## 2023-05-30 DIAGNOSIS — K219 Gastro-esophageal reflux disease without esophagitis: Secondary | ICD-10-CM

## 2023-06-01 ENCOUNTER — Telehealth: Payer: Medicare Other | Admitting: Physician Assistant

## 2023-06-01 DIAGNOSIS — J208 Acute bronchitis due to other specified organisms: Secondary | ICD-10-CM

## 2023-06-01 DIAGNOSIS — B9689 Other specified bacterial agents as the cause of diseases classified elsewhere: Secondary | ICD-10-CM

## 2023-06-01 MED ORDER — AZITHROMYCIN 250 MG PO TABS
ORAL_TABLET | ORAL | 0 refills | Status: AC
Start: 2023-06-01 — End: 2023-06-06

## 2023-06-01 MED ORDER — BENZONATATE 100 MG PO CAPS
100.0000 mg | ORAL_CAPSULE | Freq: Three times a day (TID) | ORAL | 0 refills | Status: DC | PRN
Start: 2023-06-01 — End: 2023-06-15

## 2023-06-01 NOTE — Progress Notes (Signed)

## 2023-06-03 ENCOUNTER — Ambulatory Visit: Payer: Medicare Other | Admitting: Family Medicine

## 2023-06-03 VITALS — Ht 63.0 in | Wt 165.0 lb

## 2023-06-03 DIAGNOSIS — Z0000 Encounter for general adult medical examination without abnormal findings: Secondary | ICD-10-CM

## 2023-06-03 NOTE — Patient Instructions (Addendum)
I really enjoyed getting to talk with you today! I am available on Tuesdays and Thursdays for virtual visits if you have any questions or concerns, or if I can be of any further assistance.   CHECKLIST FROM ANNUAL WELLNESS VISIT:  -Follow up (please call to schedule if not scheduled after visit):   -yearly for annual wellness visit with primary care office  Here is a list of your preventive care/health maintenance measures and the plan for each if any are due:  PLAN For any measures below that may be due:   Health Maintenance  Topic Date Due   Pneumonia Vaccine 43+ Years old (1 of 1 - PCV) Never done   COVID-19 Vaccine (3 - Pfizer risk series) 06/19/2023 (Originally 12/19/2019)   INFLUENZA VACCINE  11/15/2023 (Originally 03/18/2023)   MAMMOGRAM  12/02/2023 (Originally 06/29/2020)   Medicare Annual Wellness (AWV)  06/02/2024   DEXA SCAN  12/08/2024   DTaP/Tdap/Td (2 - Td or Tdap) 02/02/2028   Colonoscopy  09/17/2028   Hepatitis C Screening  Completed   Zoster Vaccines- Shingrix  Completed   HPV VACCINES  Aged Out    -See a dentist at least yearly  -Get your eyes checked and then per your eye specialist's recommendations  -Other issues addressed today:   -I have included below further information regarding a healthy whole foods based diet, physical activity guidelines for adults, stress management and opportunities for social connections. I hope you find this information useful.   -----------------------------------------------------------------------------------------------------------------------------------------------------------------------------------------------------------------------------------------------------------  NUTRITION: -eat real food: lots of colorful vegetables (half the plate) and fruits -5-7 servings of vegetables and fruits per day (fresh or steamed is best), exp. 2 servings of vegetables with lunch and dinner and 2 servings of fruit per day. Berries and  greens such as kale and collards are great choices.  -consume on a regular basis: whole grains (make sure first ingredient on label contains the word "whole"), fresh fruits, fish, nuts, seeds, healthy oils (such as olive oil, avocado oil, grape seed oil) -may eat small amounts of dairy and lean meat on occasion, but avoid processed meats such as ham, bacon, lunch meat, etc. -drink water -try to avoid fast food and pre-packaged foods, processed meat -most experts advise limiting sodium to < 2300mg  per day, should limit further is any chronic conditions such as high blood pressure, heart disease, diabetes, etc. The American Heart Association advised that < 1500mg  is is ideal -try to avoid foods that contain any ingredients with names you do not recognize  -try to avoid sugar/sweets (except for the natural sugar that occurs in fresh fruit) -try to avoid sweet drinks -try to avoid white rice, white bread, pasta (unless whole grain), white or yellow potatoes  EXERCISE GUIDELINES FOR ADULTS: -if you wish to increase your physical activity, do so gradually and with the approval of your doctor -STOP and seek medical care immediately if you have any chest pain, chest discomfort or trouble breathing when starting or increasing exercise  -move and stretch your body, legs, feet and arms when sitting for long periods -Physical activity guidelines for optimal health in adults: -least 150 minutes per week of aerobic exercise (can talk, but not sing) once approved by your doctor, 20-30 minutes of sustained activity or two 10 minute episodes of sustained activity every day.  -resistance training at least 2 days per week if approved by your doctor -balance exercises 3+ days per week:   Stand somewhere where you have something sturdy to hold onto if you  lose balance.    1) lift up on toes, start with 5x per day and work up to 20x   2) stand and lift on leg straight out to the side so that foot is a few inches of  the floor, start with 5x each side and work up to 20x each side   3) stand on one foot, start with 5 seconds each side and work up to 20 seconds on each side  If you need ideas or help with getting more active:  -Silver sneakers https://tools.silversneakers.com  -Walk with a Doc: http://www.duncan-williams.com/  -try to include resistance (weight lifting/strength building) and balance exercises twice per week: or the following link for ideas: http://castillo-powell.com/  BuyDucts.dk  STRESS MANAGEMENT: -can try meditating, or just sitting quietly with deep breathing while intentionally relaxing all parts of your body for 5 minutes daily -if you need further help with stress, anxiety or depression please follow up with your primary doctor or contact the wonderful folks at [facility]: [phone]  SOCIAL CONNECTIONS: -options in [place] if you wish to engage in more social and exercise related activities:  -Silver sneakers https://tools.silversneakers.com  -Walk with a Doc: http://www.duncan-williams.com/  -Check out the [facility] Active Adults 50+ section on the [place] [employer] (hiking clubs, book clubs, cards and games, chess, exercise classes, aquatic classes and much more) - see the website for details: https://www.[place].gov/departments/parks-recreation/active-adults50  -YouTube has lots of exercise videos for different ages and abilities as well  -[facility] (a variety of indoor and outdoor inperson activities for adults). 7541640849. [address]  -Virtual Online Classes (a variety of topics): see seniorplanet.org or call [phone]  -consider volunteering at a school, hospice center, church, senior center or elsewhere

## 2023-06-03 NOTE — Progress Notes (Signed)
PATIENT CHECK-IN and HEALTH RISK ASSESSMENT QUESTIONNAIRE:  -completed by phone/video for upcoming Medicare Preventive Visit  Pre-Visit Check-in: 1)Vitals (height, wt, BP, etc) - record in vitals section for visit on day of visit Request home vitals (wt, BP, etc.) and enter into vitals, THEN update Vital Signs SmartPhrase below at the top of the HPI. See below.  2)Review and Update Medications, Allergies PMH, Surgeries, Social history in Epic 3)Hospitalizations in the last year with date/reason? No  4)Review and Update Care Team (patient's specialists) in Epic 5) Complete PHQ9 in Epic  6) Complete Fall Screening in Epic 7)Review all Health Maintenance Due and order under PCP if not done.  8)Medicare Wellness Questionnaire: Answer theses question about your habits: Do you drink alcohol? yes If yes, how many drinks do you have a day? About 1 or 2 drinks, monthly or less Have you ever smoked?no Quit date if applicable? N/a  How many packs a day do/did you smoke? N/a Do you use smokeless tobacco?no Do you use an illicit drugs?no Do you exercises? Yes IF so, what type and how many days/minutes per week?about 2 days for 20 mins - [employer] classes Are you sexually active? Didn't ask pt. Number of partners?n/a Typical breakfast: oatmeal, sometimes english muffin, yogurt Typical lunch: eat late lunch at around 3: varies- meat: [place], chicken, salad, vegetables, grill chicken sandwich Typical dinner:no dinner Typical snacks:sometimes skinny pop  Beverages: coke zero, water, half and half tea  Answer theses question about you: Can you perform most household chores?yes Do you find it hard to follow a conversation in a noisy room?no Do you often ask people to speak up or repeat themselves?no Do you feel that you have a problem with memory?no Do you balance your checkbook and or bank acounts?no Do you feel safe at home?no Last dentist visit?July 2024, go every 6 months. Seeing [facility] Dr[address]  Do you need assistance with any of the following: No Please note if so - none  Driving?  Feeding yourself?  Getting from bed to chair?  Getting to the toilet?  Bathing or showering?  Dressing yourself?  Managing money?  Climbing a flight of stairs  Preparing meals?  Do you have Advanced Directives in place (Living Will, Healthcare Power or Attorney)? Yes   Last eye Exam and location?Last exam was last month. [facility]. Dr. Maris Berger   Do you currently use prescribed or non-prescribed narcotic or opioid pain medications?No  Do you have a history or close family history of breast, ovarian, tubal or peritoneal cancer or a family member with BRCA (breast cancer susceptibility 1 and 2) gene mutations? Maternal Aunt had breast cancer.   Request home vitals (wt, BP, etc.) and enter into vitals, THEN update Vital Signs SmartPhrase below at the top of the HPI. See below.   Nurse/Assistant Credentials/time stamp: Karpuih Moyun/CMA/3:38pm   ----------------------------------------------------------------------------------------------------------------------------------------------------------------------------------------------------------------------  Because this visit was a virtual/telehealth visit, some criteria may be missing or patient reported. Any vitals not documented were not able to be obtained and vitals that have been documented are patient reported.    MEDICARE ANNUAL PREVENTIVE VISIT WITH PROVIDER: (Welcome to Medicare, initial annual wellness or annual wellness exam)  Virtual Visit via Video Note  I connected with Kathryn Cummings on 06/03/23 by video enabled telemedicine application and verified that I am speaking with the correct person using two identifiers.  Location patient: home Location provider:work or home office Persons participating in the virtual visit: patient, provider  Concerns and/or follow  up today: doing  well, reports she did see cardiology for cholesterol issues. She is on low does statin trial as did not tolerate in the past, fish oil, zetia and coQ10   See HM section in Epic for other details of completed HM.    ROS: negative for report of fevers, unintentional weight loss, vision changes, vision loss, hearing loss or change, chest pain, sob, hemoptysis, melena, hematochezia, hematuria, falls, bleeding or bruising, thoughts of suicide or self harm, memory loss  Patient-completed extensive health risk assessment - reviewed and discussed with the patient: See Health Risk Assessment completed with patient prior to the visit either above or in recent phone note. This was reviewed in detailed with the patient today and appropriate recommendations, orders and referrals were placed as needed per Summary below and patient instructions.   Review of Medical History: -PMH, PSH, Family History and current specialty and care providers reviewed and updated and listed below   Patient Care Team: Alveria Apley, NP as PCP - General (Family Medicine) Orbie Pyo, MD as PCP - Cardiology (Cardiology) Aris Lot, MD as Consulting Physician (Dermatology) Sherian Rein, MD as Consulting Physician (Obstetrics and Gynecology) Maris Berger, MD as Consulting Physician (Ophthalmology)   Past Medical History:  Diagnosis Date   Anxiety    Arthritis    Basal cell adenocarcinoma    Diverticulosis    Diverticulosis    Elevated cholesterol    GERD without esophagitis    Heartburn    Hiatal hernia    History of chicken pox    Insomnia    Kidney stone    1983   Migraine headache    Osteoarthritis    Lumbar spine; hands; feet   PONV (postoperative nausea and vomiting)    Prediabetes    UTI (lower urinary tract infection)     Past Surgical History:  Procedure Laterality Date   ABDOMINAL HYSTERECTOMY  1988   APPENDECTOMY  1988   BASAL CELL CARCINOMA EXCISION     Multiple    BLADDER REPAIR  2000   Mesh   BREAST BIOPSY  1990   CHOLECYSTECTOMY     EXCISION HAGLUND'S DEFORMITY WITH ACHILLES TENDON REPAIR Right 10/30/2021   Procedure: Excision of haglund deformity, right Achilles tendon reconstruction;  Surgeon: Toni Arthurs, MD;  Location: [facility];  Service: Orthopedics;  Laterality: Right;   GASTROC RECESSION EXTREMITY Right 10/30/2021   Procedure: Right gastroc recession;  Surgeon: Toni Arthurs, MD;  Location: [facility];  Service: Orthopedics;  Laterality: Right;    Social History   Socioeconomic History   Marital status: Married    Spouse name: Not on file   Number of children: 2   Years of education: college   Highest education level: Associate degree: academic program  Occupational History   Not on file  Tobacco Use   Smoking status: Never   Smokeless tobacco: Never  Vaping Use   Vaping status: Never Used  Substance and Sexual Activity   Alcohol use: Yes    Alcohol/week: 2.0 standard drinks of alcohol    Types: 2 Standard drinks or equivalent per week    Comment: 2 times a month wine/beer   Drug use: No   Sexual activity: Yes    Partners: Male  Other Topics Concern   Not on file  Social History Narrative   Not on file   Social Determinants of Health   Financial Resource Strain: Low Risk  (01/01/2023)   Overall Financial Resource  Strain (CARDIA)    Difficulty of Paying Living Expenses: Not hard at all  Food Insecurity: No Food Insecurity (01/01/2023)   Hunger Vital Sign    Worried About Running Out of Food in the Last Year: Never true    Ran Out of Food in the Last Year: Never true  Transportation Needs: No Transportation Needs (01/01/2023)   PRAPARE - Administrator, Civil Service (Medical): No    Lack of Transportation (Non-Medical): No  Physical Activity: Insufficiently Active (01/01/2023)   Exercise Vital Sign    Days of Exercise per Week: 2 days    Minutes of Exercise per Session: 10  min  Stress: No Stress Concern Present (01/01/2023)   [employer] of Occupational Health - Occupational Stress Questionnaire    Feeling of Stress : Only a little  Social Connections: Socially Integrated (01/01/2023)   Social Connection and Isolation Panel [NHANES]    Frequency of Communication with Friends and Family: More than three times a week    Frequency of Social Gatherings with Friends and Family: Three times a week    Attends Religious Services: More than 4 times per year    Active Member of Clubs or Organizations: Yes    Attends Banker Meetings: More than 4 times per year    Marital Status: Married  Catering manager Violence: Not At Risk (12/07/2022)   Humiliation, Afraid, Rape, and Kick questionnaire    Fear of Current or Ex-Partner: No    Emotionally Abused: No    Physically Abused: No    Sexually Abused: No    Family History  Problem Relation Age of Onset   Arthritis-Osteo Mother        Living   Diabetes Mother    Hyperlipidemia Mother    Hypertension Mother    Arthritis-Osteo Father        Living   Diabetes Father    Hypertension Father    Cancer Sister        Skin   Arthritis Sister    Healthy Sister        x1   Arthritis Brother    Cancer Brother        Skin   Healthy Brother        x1   Healthy Son        x2   Breast cancer Maternal Aunt    Heart attack Maternal Grandmother    Alzheimer's disease Maternal Grandmother    Heart attack Maternal Grandfather    Dementia Paternal Grandmother    Cancer Paternal Grandfather        Liver   Pancreatic cancer Paternal Grandfather    Colon cancer Neg Hx    Colon polyps Neg Hx    Liver disease Neg Hx    Esophageal cancer Neg Hx    Stomach cancer Neg Hx    Rectal cancer Neg Hx     Current Outpatient Medications on File Prior to Visit  Medication Sig Dispense Refill   ALPRAZolam (XANAX) 0.5 MG tablet Take 1 tablet (0.5 mg total) by mouth 2 (two) times daily as needed for anxiety. 30  tablet 1   atorvastatin (LIPITOR) 10 MG tablet Take 1 tablet (10 mg total) by mouth daily. 90 tablet 3   azithromycin (ZITHROMAX) 250 MG tablet Take 2 tablets on day 1, then 1 tablet daily on days 2 through 5 6 tablet 0   benzonatate (TESSALON) 100 MG capsule Take 1-2 capsules (100-200 mg total) by mouth  3 (three) times daily as needed. 30 capsule 0   cetirizine (ZYRTEC) 10 MG chewable tablet Chew 10 mg by mouth daily as needed for allergies.     ezetimibe (ZETIA) 10 MG tablet Take 1 tablet (10 mg total) by mouth daily. 90 tablet 3   Nutritional Supplements (JUICE PLUS FIBRE) LIQD Take by mouth daily.     Omega-3 Fatty Acids (FISH OIL) 1000 MG CAPS Take 1 capsule by mouth daily.     omeprazole (PRILOSEC) 40 MG capsule TAKE 1 CAPSULE (40 MG TOTAL) BY MOUTH DAILY. 90 capsule 0   SUMAtriptan (IMITREX) 100 MG tablet TAKE 1 TABLET BY MOUTH AS NEEDED FOR HEADACHE. MAY REPEAT IN 2 HOURS IF HEADACHE PERSISTS OR RECURS 10 tablet 3   No current facility-administered medications on file prior to visit.    Allergies  Allergen Reactions   Bactrim [Sulfamethoxazole-Trimethoprim] Rash       Physical Exam Vitals requested from patient and listed below if patient had equipment and was able to obtain at home for this virtual visit: Vitals:   Estimated body mass index is 29.23 kg/m as calculated from the following:   Height as of this encounter: 5\' 3"  (1.6 m).   Weight as of this encounter: 165 lb (74.8 kg).  EKG (optional): deferred due to virtual visit  GENERAL: alert, oriented, no acute distress detected, full vision exam deferred due to pandemic and/or virtual encounter  HEENT: atraumatic, conjunttiva clear, no obvious abnormalities on inspection of external nose and ears  NECK: normal movements of the head and neck  LUNGS: on inspection no signs of respiratory distress, breathing rate appears normal, no obvious gross SOB, gasping or wheezing  CV: no obvious cyanosis  MS: moves all visible  extremities without noticeable abnormality  PSYCH/NEURO: pleasant and cooperative, no obvious depression or anxiety, speech and thought processing grossly intact, Cognitive function grossly intact  Flowsheet Row Office Visit from 06/03/2023 in [facility]  PHQ-9 Total Score 4           06/03/2023    3:27 PM 03/05/2023    9:38 AM 12/07/2022   12:58 PM 06/17/2022    3:29 PM 04/29/2022    1:37 PM  Depression screen PHQ 2/9  Decreased Interest 0 1 0 0 0  Down, Depressed, Hopeless 0 0 0 0 0  PHQ - 2 Score 0 1 0 0 0  Altered sleeping 1 1 1     Tired, decreased energy 3 0 1    Change in appetite 0 0 0    Feeling bad or failure about yourself  0 0 0    Trouble concentrating 0 0 0    Moving slowly or fidgety/restless 0 0 0    Suicidal thoughts 0 0 0    PHQ-9 Score 4 2 2     Difficult doing work/chores Not difficult at all Somewhat difficult Not difficult at all         12/07/2022   12:53 PM 01/04/2023   12:53 PM 03/05/2023    9:38 AM 05/03/2023   10:36 AM 06/03/2023   10:52 AM  Fall Risk  Falls in the past year? 0 0 0 0 0  Was there an injury with Fall? 0 0 0 0 0  Fall Risk Category Calculator 0 0 0 0 0  Patient at Risk for Falls Due to No Fall Risks No Fall Risks No Fall Risks No Fall Risks   Fall risk Follow up Falls evaluation completed Falls  evaluation completed Falls evaluation completed Falls evaluation completed      SUMMARY AND PLAN:  Encounter for Medicare annual wellness exam   Discussed applicable health maintenance/preventive health measures and advised and referred or ordered per patient preferences: -discussed vaccines due and how to get -had mammogram at outside facility - staff requesting Health Maintenance  Topic Date Due   Pneumonia Vaccine 78+ Years old (1 of 1 - PCV) Never done   COVID-19 Vaccine (3 - Pfizer risk series) 06/19/2023 (Originally 12/19/2019)   INFLUENZA VACCINE  11/15/2023 (Originally 03/18/2023)   MAMMOGRAM   12/02/2023 (Originally 06/29/2020)   Medicare Annual Wellness (AWV)  06/02/2024   DEXA SCAN  12/08/2024   DTaP/Tdap/Td (2 - Td or Tdap) 02/02/2028   Colonoscopy  09/17/2028   Hepatitis C Screening  Completed   Zoster Vaccines- Shingrix  Completed   HPV VACCINES  Aged [employer] and counseling on the following was provided based on the above review of health and a plan/checklist for the patient, along with additional information discussed, was provided for the patient in the patient instructions :   -Provided safe balance exercises that can be done at home to improve balance and discussed exercise guidelines for adults with include balance exercises at least 3 days per week.  -Advised and counseled on a healthy lifestyle - including the importance of a healthy diet -provided counseling information - see pt instructions -Reviewed patient's current diet. Advised and counseled on a whole foods based healthy diet. A summary of a healthy diet was provided in the Patient Instructions.  -reviewed patient's current physical activity level and discussed exercise guidelines for adults. Discussed community resources and ideas for safe exercise at home to assist in meeting exercise guideline recommendations in a safe and healthy way.  -Advise yearly dental visits at minimum and regular eye exams   Follow up: see patient instructions     Patient Instructions  I really enjoyed getting to talk with you today! I am available on Tuesdays and Thursdays for virtual visits if you have any questions or concerns, or if I can be of any further assistance.   CHECKLIST FROM ANNUAL WELLNESS VISIT:  -Follow up (please call to schedule if not scheduled after visit):   -yearly for annual wellness visit with primary care office  Here is a list of your preventive care/health maintenance measures and the plan for each if any are due:  PLAN For any measures below that may be due:   Health Maintenance   Topic Date Due   Pneumonia Vaccine 56+ Years old (1 of 1 - PCV) Never done   COVID-19 Vaccine (3 - Pfizer risk series) 06/19/2023 (Originally 12/19/2019)   INFLUENZA VACCINE  11/15/2023 (Originally 03/18/2023)   MAMMOGRAM  12/02/2023 (Originally 06/29/2020)   Medicare Annual Wellness (AWV)  06/02/2024   DEXA SCAN  12/08/2024   DTaP/Tdap/Td (2 - Td or Tdap) 02/02/2028   Colonoscopy  09/17/2028   Hepatitis C Screening  Completed   Zoster Vaccines- Shingrix  Completed   HPV VACCINES  Aged Out    -See a dentist at least yearly  -Get your eyes checked and then per your eye specialist's recommendations  -Other issues addressed today:   -I have included below further information regarding a healthy whole foods based diet, physical activity guidelines for adults, stress management and opportunities for social connections. I hope you find this information useful.   -----------------------------------------------------------------------------------------------------------------------------------------------------------------------------------------------------------------------------------------------------------  NUTRITION: -eat real food: lots of colorful vegetables (half the  plate) and fruits -5-7 servings of vegetables and fruits per day (fresh or steamed is best), exp. 2 servings of vegetables with lunch and dinner and 2 servings of fruit per day. Berries and greens such as kale and collards are great choices.  -consume on a regular basis: whole grains (make sure first ingredient on label contains the word "whole"), fresh fruits, fish, nuts, seeds, healthy oils (such as olive oil, avocado oil, grape seed oil) -may eat small amounts of dairy and lean meat on occasion, but avoid processed meats such as ham, bacon, lunch meat, etc. -drink water -try to avoid fast food and pre-packaged foods, processed meat -most experts advise limiting sodium to < 2300mg  per day, should limit further is any  chronic conditions such as high blood pressure, heart disease, diabetes, etc. The American Heart Association advised that < 1500mg  is is ideal -try to avoid foods that contain any ingredients with names you do not recognize  -try to avoid sugar/sweets (except for the natural sugar that occurs in fresh fruit) -try to avoid sweet drinks -try to avoid white rice, white bread, pasta (unless whole grain), white or yellow potatoes  EXERCISE GUIDELINES FOR ADULTS: -if you wish to increase your physical activity, do so gradually and with the approval of your doctor -STOP and seek medical care immediately if you have any chest pain, chest discomfort or trouble breathing when starting or increasing exercise  -move and stretch your body, legs, feet and arms when sitting for long periods -Physical activity guidelines for optimal health in adults: -least 150 minutes per week of aerobic exercise (can talk, but not sing) once approved by your doctor, 20-30 minutes of sustained activity or two 10 minute episodes of sustained activity every day.  -resistance training at least 2 days per week if approved by your doctor -balance exercises 3+ days per week:   Stand somewhere where you have something sturdy to hold onto if you lose balance.    1) lift up on toes, start with 5x per day and work up to 20x   2) stand and lift on leg straight out to the side so that foot is a few inches of the floor, start with 5x each side and work up to 20x each side   3) stand on one foot, start with 5 seconds each side and work up to 20 seconds on each side  If you need ideas or help with getting more active:  -Silver sneakers https://tools.silversneakers.com  -Walk with a Doc: http://www.duncan-williams.com/  -try to include resistance (weight lifting/strength building) and balance exercises twice per week: or the following link for  ideas: http://castillo-powell.com/  BuyDucts.dk  STRESS MANAGEMENT: -can try meditating, or just sitting quietly with deep breathing while intentionally relaxing all parts of your body for 5 minutes daily -if you need further help with stress, anxiety or depression please follow up with your primary doctor or contact the wonderful folks at [facility]: [phone]  SOCIAL CONNECTIONS: -options in [place] if you wish to engage in more social and exercise related activities:  -Silver sneakers https://tools.silversneakers.com  -Walk with a Doc: http://www.duncan-williams.com/  -Check out the [facility] Active Adults 50+ section on the [place] of [employer] (hiking clubs, book clubs, cards and games, chess, exercise classes, aquatic classes and much more) - see the website for details: https://www.[place].gov/departments/parks-recreation/active-adults50  -YouTube has lots of exercise videos for different ages and abilities as well  -[facility] (a variety of indoor and outdoor inperson activities for  adults). ([phone]. [address].  -Virtual Online Classes (a variety of topics): see seniorplanet.org or call [phone]  -consider volunteering at a school, hospice center, church, senior center or elsewhere           Terressa Koyanagi, DO

## 2023-06-14 NOTE — Progress Notes (Unsigned)
   Established Patient Office Visit   Subjective:  Patient ID: Kathryn Cummings, female    DOB: 23-Nov-1955  Age: 67 y.o. MRN: [id number]  No chief complaint on file.   HPI Anxiety/Insomnia: Patient is prescribed Alprazolam 0.5mg  2 times a day as needed. However, she takes it 2-3 times a month when she can not sleep. Effective. Patient has been able to successfully discontinue Fluoxetine 20mg .   GERD: She takes Omeprazole 40mg  daily. Effective. She reports if she misses the medication, she will have symptoms. She dose have a hiatal hernia.   Migraines: She reports she gets headaches with temperature change. Does not occur very often. Usually takes Ibuprofen for the pain. Rarely takes Sumatriptan 100mg .   Since previous chronic management visit, patient has been seen by [facility] with Edd Fabian, NP on 10/08. He would like her to continue Co-Q10, Ezetimibe, and Lipitor 10mg . Plans to repeat fasting lipids and LFTs the week before Thanksgiving.  ROS See HPI above     Objective:     There were no vitals taken for this visit. {Vitals History (Optional):23777}  Physical Exam  No results found for any visits on 06/15/23.  The 10-year ASCVD risk score (Arnett DK, et al., 2019) is: 7.2%    Assessment & Plan:  There are no diagnoses linked to this encounter. 1.Review health maintenance:  -PNA vaccine:Declines  -Influenza vaccine: -Covid booster: Declines    No follow-ups on file.   Zandra Abts, NP

## 2023-06-15 ENCOUNTER — Ambulatory Visit (INDEPENDENT_AMBULATORY_CARE_PROVIDER_SITE_OTHER): Payer: Medicare Other | Admitting: Family Medicine

## 2023-06-15 VITALS — BP 102/80 | HR 67 | Temp 98.6°F | Wt 168.9 lb

## 2023-06-15 DIAGNOSIS — F411 Generalized anxiety disorder: Secondary | ICD-10-CM | POA: Diagnosis not present

## 2023-06-15 DIAGNOSIS — R7303 Prediabetes: Secondary | ICD-10-CM

## 2023-06-15 DIAGNOSIS — G4700 Insomnia, unspecified: Secondary | ICD-10-CM | POA: Diagnosis not present

## 2023-06-15 DIAGNOSIS — K219 Gastro-esophageal reflux disease without esophagitis: Secondary | ICD-10-CM | POA: Diagnosis not present

## 2023-06-15 DIAGNOSIS — G43009 Migraine without aura, not intractable, without status migrainosus: Secondary | ICD-10-CM | POA: Diagnosis not present

## 2023-06-15 LAB — HEMOGLOBIN A1C: Hgb A1c MFr Bld: 6.3 % (ref 4.6–6.5)

## 2023-06-15 NOTE — Assessment & Plan Note (Signed)
Continue with Sumatriptan  as needed for headaches.

## 2023-06-15 NOTE — Assessment & Plan Note (Signed)
Urine drug screen completed today for management of Alprazolam. Patient and this provider signed a controlled substance contract. Pt provided a copy. This will need to be updated yearly. PDMP reviewed. Last refill was 06/17/2022.

## 2023-06-15 NOTE — Assessment & Plan Note (Addendum)
Last A1c is 6.3 in April 2024. Not taking any medication. Ordered A1c.

## 2023-06-15 NOTE — Assessment & Plan Note (Signed)
Urine drug screen completed today for management of Alprazolam. Patient and this provider signed a controlled substance contract. Pt provided a copy. This will need to be updated yearly. PDMP reviewed. Last refill was 06/17/2022. Patient is no longer taking Fluoxetine. However, if she reports she can tell a difference with being off medication, crying more easily. She reports her symptoms are not severe enough to go back on medication. If she changes her mind, she will follow up with a telehealth visit or regular office visit.

## 2023-06-15 NOTE — Patient Instructions (Addendum)
-  It was good to see you. Sending you good vibes on about your husband. I hope things start to improve. -Urine drug screen completed today for management of Alprazolam. You and this provider signed a controlled substance contract. Provided a copy. This will need to be updated yearly. Will refill Alprazolam.  -Continue all other medications.  -Follow up in 6 months.  -If you change your mind about needing a long acting anxiety medication, please make an appointment. It can be a telehealth.

## 2023-06-15 NOTE — Assessment & Plan Note (Signed)
Continue with Omeprazole  daily. It is effective.

## 2023-06-16 LAB — DRUG MONITORING, PANEL 8 WITH CONFIRMATION, URINE
6 Acetylmorphine: NEGATIVE ng/mL (ref <10)
Alcohol Metabolites: NEGATIVE ng/mL (ref <500)
Amphetamines: NEGATIVE ng/mL (ref <500)
Benzodiazepines: NEGATIVE ng/mL (ref <100)
Buprenorphine, Urine: NEGATIVE ng/mL (ref <5)
Cocaine Metabolite: NEGATIVE ng/mL (ref <150)
Creatinine: 21.6 mg/dL (ref >=20.0)
MDMA: NEGATIVE ng/mL (ref <500)
Marijuana Metabolite: NEGATIVE ng/mL (ref <20)
Opiates: NEGATIVE ng/mL (ref <100)
Oxidant: NEGATIVE ug/mL (ref <200)
Oxycodone: NEGATIVE ng/mL (ref <100)
pH: 6.9 (ref 4.5–9.0)

## 2023-06-16 LAB — DM TEMPLATE

## 2023-07-07 ENCOUNTER — Other Ambulatory Visit: Payer: Self-pay | Admitting: *Deleted

## 2023-07-07 DIAGNOSIS — E782 Mixed hyperlipidemia: Secondary | ICD-10-CM

## 2023-07-07 LAB — HEPATIC FUNCTION PANEL
ALT: 23 [IU]/L (ref 0–32)
AST: 28 [IU]/L (ref 0–40)
Albumin: 4.3 g/dL (ref 3.9–4.9)
Alkaline Phosphatase: 105 [IU]/L (ref 44–121)
Bilirubin Total: 0.3 mg/dL (ref 0.0–1.2)
Bilirubin, Direct: 0.11 mg/dL (ref 0.00–0.40)
Total Protein: 6.9 g/dL (ref 6.0–8.5)

## 2023-07-07 LAB — LIPID PANEL
Chol/HDL Ratio: 2.8 {ratio} (ref 0.0–4.4)
Cholesterol, Total: 169 mg/dL (ref 100–199)
HDL: 60 mg/dL (ref >39)
LDL Chol Calc (NIH): 76 mg/dL (ref 0–99)
Triglycerides: 198 mg/dL — ABNORMAL HIGH (ref 0–149)
VLDL Cholesterol Cal: 33 mg/dL (ref 5–40)

## 2023-07-07 MED ORDER — FENOFIBRATE 134 MG PO CAPS
134.0000 mg | ORAL_CAPSULE | Freq: Every day | ORAL | 3 refills | Status: DC
  Filled 2023-10-08 – 2023-10-18 (×2): qty 90, 90d supply, fill #0
  Filled 2024-01-31: qty 90, 90d supply, fill #1
  Filled 2024-04-26: qty 90, 90d supply, fill #2

## 2023-07-07 NOTE — Progress Notes (Signed)
The patient has been notified of the result and verbalized understanding.  All questions (if any) were answered. E-sent Fenofibrate 134 mg daily, mailed 3 months lipid and Liver function panel   Clarification from J. Cleaver to orded 134 mg instead of 130 mg - . 130 mg is not on patient's formulary.  Tobin Chad, RN 07/07/2023 4:28 PM

## 2023-08-17 ENCOUNTER — Other Ambulatory Visit (HOSPITAL_BASED_OUTPATIENT_CLINIC_OR_DEPARTMENT_OTHER): Payer: Self-pay

## 2023-08-17 MED ORDER — EZETIMIBE 10 MG PO TABS: 10.0000 mg | ORAL_TABLET | Freq: Every day | ORAL | 3 refills | Status: DC

## 2023-08-17 MED ORDER — FLUOXETINE HCL 20 MG PO CAPS: 20.0000 mg | ORAL_CAPSULE | Freq: Every day | ORAL | 1 refills | Status: DC

## 2023-08-17 MED ORDER — CELECOXIB 200 MG PO CAPS
200.0000 mg | ORAL_CAPSULE | Freq: Every day | ORAL | 2 refills | Status: AC
  Filled 2023-10-18: qty 30, 30d supply, fill #0
  Filled 2024-07-26: qty 30, 30d supply, fill #1

## 2023-08-18 ENCOUNTER — Encounter (HOSPITAL_BASED_OUTPATIENT_CLINIC_OR_DEPARTMENT_OTHER): Payer: Self-pay

## 2023-08-19 ENCOUNTER — Other Ambulatory Visit (HOSPITAL_BASED_OUTPATIENT_CLINIC_OR_DEPARTMENT_OTHER): Payer: Self-pay

## 2023-08-27 ENCOUNTER — Encounter: Payer: Self-pay | Admitting: Family Medicine

## 2023-08-27 ENCOUNTER — Other Ambulatory Visit: Payer: Self-pay | Admitting: Family

## 2023-08-27 ENCOUNTER — Other Ambulatory Visit: Payer: Self-pay | Admitting: Family Medicine

## 2023-08-27 DIAGNOSIS — K219 Gastro-esophageal reflux disease without esophagitis: Secondary | ICD-10-CM

## 2023-08-27 DIAGNOSIS — F411 Generalized anxiety disorder: Secondary | ICD-10-CM

## 2023-08-27 DIAGNOSIS — E782 Mixed hyperlipidemia: Secondary | ICD-10-CM

## 2023-08-27 MED ORDER — OMEPRAZOLE 40 MG PO CPDR
40.0000 mg | DELAYED_RELEASE_CAPSULE | Freq: Every day | ORAL | 0 refills | Status: DC
Start: 2023-08-27 — End: 2024-02-07
  Filled 2023-08-27 – 2023-08-28 (×2): qty 90, 90d supply, fill #0

## 2023-08-27 MED ORDER — EZETIMIBE 10 MG PO TABS
10.0000 mg | ORAL_TABLET | Freq: Every day | ORAL | 3 refills | Status: DC
  Filled 2023-08-27 – 2023-08-28 (×2): qty 90, 90d supply, fill #0
  Filled 2023-11-29: qty 90, 90d supply, fill #1
  Filled 2024-02-25: qty 90, 90d supply, fill #2
  Filled 2024-04-26 – 2024-05-29 (×2): qty 90, 90d supply, fill #3

## 2023-08-27 MED ORDER — ALPRAZOLAM 0.5 MG PO TABS
0.5000 mg | ORAL_TABLET | Freq: Two times a day (BID) | ORAL | 1 refills | Status: DC | PRN
Start: 2023-08-27 — End: 2024-05-16
  Filled 2023-08-27 – 2023-08-28 (×2): qty 30, 15d supply, fill #0
  Filled 2024-01-21: qty 30, 15d supply, fill #1

## 2023-08-28 ENCOUNTER — Other Ambulatory Visit (HOSPITAL_BASED_OUTPATIENT_CLINIC_OR_DEPARTMENT_OTHER): Payer: Self-pay

## 2023-08-31 ENCOUNTER — Telehealth: Payer: Medicare Other | Admitting: Nurse Practitioner

## 2023-08-31 ENCOUNTER — Other Ambulatory Visit (HOSPITAL_BASED_OUTPATIENT_CLINIC_OR_DEPARTMENT_OTHER): Payer: Self-pay

## 2023-08-31 DIAGNOSIS — J40 Bronchitis, not specified as acute or chronic: Secondary | ICD-10-CM

## 2023-08-31 MED ORDER — BENZONATATE 100 MG PO CAPS
100.0000 mg | ORAL_CAPSULE | Freq: Three times a day (TID) | ORAL | 0 refills | Status: DC | PRN
Start: 2023-08-31 — End: 2023-12-10
  Filled 2023-08-31: qty 30, 10d supply, fill #0

## 2023-08-31 MED ORDER — AZITHROMYCIN 250 MG PO TABS
ORAL_TABLET | ORAL | 0 refills | Status: AC
Start: 2023-08-31 — End: 2023-09-05
  Filled 2023-08-31: qty 6, 5d supply, fill #0

## 2023-08-31 NOTE — Progress Notes (Signed)
 E-Visit for Cough  We are sorry that you are not feeling well.  Here is how we plan to help!  Based on your presentation I believe you most likely have A cough due to bacteria.  When patients have a fever and a productive cough with a change in color or increased sputum production, we are concerned about bacterial bronchitis.  If left untreated it can progress to pneumonia.  If your symptoms do not improve with your treatment plan it is important that you contact your provider.   I have prescribed Azithromyin 250 mg: two tablets now and then one tablet daily for 4 additonal days    In addition you may use A prescription cough medication called Tessalon Perles 100mg . You may take 1-2 capsules every 8 hours as needed for your cough.   From your responses in the eVisit questionnaire you describe inflammation in the upper respiratory tract which is causing a significant cough.  This is commonly called Bronchitis and has four common causes:   Allergies Viral Infections Acid Reflux Bacterial Infection Allergies, viruses and acid reflux are treated by controlling symptoms or eliminating the cause. An example might be a cough caused by taking certain blood pressure medications. You stop the cough by changing the medication. Another example might be a cough caused by acid reflux. Controlling the reflux helps control the cough.  USE OF BRONCHODILATOR ("RESCUE") INHALERS: There is a risk from using your bronchodilator too frequently.  The risk is that over-reliance on a medication which only relaxes the muscles surrounding the breathing tubes can reduce the effectiveness of medications prescribed to reduce swelling and congestion of the tubes themselves.  Although you feel brief relief from the bronchodilator inhaler, your asthma may actually be worsening with the tubes becoming more swollen and filled with mucus.  This can delay other crucial treatments, such as oral steroid medications. If you need to use a  bronchodilator inhaler daily, several times per day, you should discuss this with your provider.  There are probably better treatments that could be used to keep your asthma under control.     HOME CARE Only take medications as instructed by your medical team. Complete the entire course of an antibiotic. Drink plenty of fluids and get plenty of rest. Avoid close contacts especially the very young and the elderly Cover your mouth if you cough or cough into your sleeve. Always remember to wash your hands A steam or ultrasonic humidifier can help congestion.   GET HELP RIGHT AWAY IF: You develop worsening fever. You become short of breath You cough up blood. Your symptoms persist after you have completed your treatment plan MAKE SURE YOU  Understand these instructions. Will watch your condition. Will get help right away if you are not doing well or get worse.    Thank you for choosing an e-visit.  Your e-visit answers were reviewed by a board certified advanced clinical practitioner to complete your personal care plan. Depending upon the condition, your plan could have included both over the counter or prescription medications.  Please review your pharmacy choice. Make sure the pharmacy is open so you can pick up prescription now. If there is a problem, you may contact your provider through [employer] and have the prescription routed to another pharmacy.  Your safety is important to [place]. If you have drug allergies check your prescription carefully.   For the next 24 hours you can use MyChart to ask questions about today's visit, request a non-urgent  call back, or ask for a work or school excuse. You will get an email in the next two days asking about your experience. I hope that your e-visit has been valuable and will speed your recovery.   Meds ordered this encounter  Medications   azithromycin (ZITHROMAX) 250 MG tablet    Sig: Take 2 tablets on day 1, then 1 tablet daily on  days 2 through 5    Dispense:  6 tablet    Refill:  0   benzonatate (TESSALON) 100 MG capsule    Sig: Take 1 capsule (100 mg total) by mouth 3 (three) times daily as needed.    Dispense:  30 capsule    Refill:  0    I spent approximately 5 minutes reviewing the patient's history, current symptoms and coordinating their care today.

## 2023-09-02 ENCOUNTER — Ambulatory Visit: Payer: Self-pay | Admitting: Family Medicine

## 2023-09-02 ENCOUNTER — Other Ambulatory Visit (HOSPITAL_BASED_OUTPATIENT_CLINIC_OR_DEPARTMENT_OTHER): Payer: Self-pay

## 2023-09-02 ENCOUNTER — Encounter: Payer: Self-pay | Admitting: Family

## 2023-09-02 ENCOUNTER — Telehealth: Payer: Medicare Other | Admitting: Physician Assistant

## 2023-09-02 ENCOUNTER — Ambulatory Visit: Payer: Medicare Other | Admitting: Family

## 2023-09-02 VITALS — BP 112/72 | HR 89 | Temp 98.4°F | Ht 63.0 in | Wt 168.8 lb

## 2023-09-02 DIAGNOSIS — B999 Unspecified infectious disease: Secondary | ICD-10-CM

## 2023-09-02 DIAGNOSIS — J209 Acute bronchitis, unspecified: Secondary | ICD-10-CM

## 2023-09-02 DIAGNOSIS — J9801 Acute bronchospasm: Secondary | ICD-10-CM

## 2023-09-02 DIAGNOSIS — R059 Cough, unspecified: Secondary | ICD-10-CM | POA: Diagnosis not present

## 2023-09-02 MED ORDER — HYDROCODONE BIT-HOMATROP MBR 5-1.5 MG/5ML PO SOLN
5.0000 mL | Freq: Three times a day (TID) | ORAL | 0 refills | Status: DC | PRN
  Filled 2023-09-02: qty 120, 8d supply, fill #0

## 2023-09-02 MED ORDER — ALBUTEROL SULFATE (2.5 MG/3ML) 0.083% IN NEBU
2.5000 mg | INHALATION_SOLUTION | Freq: Once | RESPIRATORY_TRACT | Status: AC
Start: 2023-09-02 — End: 2023-09-02
  Administered 2023-09-02: 2.5 mg via RESPIRATORY_TRACT

## 2023-09-02 MED ORDER — PREDNISONE 20 MG PO TABS
ORAL_TABLET | ORAL | 0 refills | Status: AC
Start: 2023-09-02 — End: 2023-09-09
  Filled 2023-09-02: qty 9, 7d supply, fill #0

## 2023-09-02 MED ORDER — DOXYCYCLINE HYCLATE 100 MG PO TABS
100.0000 mg | ORAL_TABLET | Freq: Two times a day (BID) | ORAL | 0 refills | Status: DC
Start: 2023-09-02 — End: 2023-12-10
  Filled 2023-09-02: qty 14, 7d supply, fill #0

## 2023-09-02 NOTE — Telephone Encounter (Signed)
Copied from CRM [id number]. Topic: Clinical - Red Word Triage >> Sep 02, 2023  8:02 AM Theodis Sato wrote: Reason for CRM: Woke up to temperature at 101 and is having body chills even after starting antibiotic course.  Chief Complaint:  Fever   Day 4 Antibiotics  Feels worse . Deep Cough   Symptoms: Temp 101.F @5  am, Deep Cough  Mucus Tan  Frequency:  Day 4 Pertinent Negatives: Patient denies  Disposition: [] ED /[] Urgent Care (no appt availability in office) / [x] Appointment(In office/virtual)/ []  [facility]/ [] Home Care/ [] Refused Recommended Disposition /[] [facility]/ []  Follow-up with PCP Additional Notes:  Scheduled for In office today.  Reason for Disposition  [1] Taking antibiotic > 72 hours (3 days) AND [2] symptoms (other than fever) not improved  Answer Assessment - Initial Assessment Questions 1. INFECTION: "What infection is the antibiotic being given for?"       Cough  2. ANTIBIOTIC: "What antibiotic are you taking" "How many times per day?"     Amoxicillin  3. DURATION: "When was the antibiotic started?"      5d  4. MAIN CONCERN OR SYMPTOM:  "What is your main concern right now?"     Fever 5. BETTER-SAME-WORSE: "Are you getting better, staying the same, or getting worse compared to when you first started the antibiotics?" If getting worse, ask: "In what way?"      Worse 6. FEVER: "Do you have a fever?" If Yes, ask: "What is your temperature, how was it measured, and when did it start?"     05 am 101  7. SYMPTOMS: "Are there any other symptoms you're concerned about?" If Yes, ask: "When did it start?"      Cough 8. FOLLOW-UP APPOINTMENT: "Do you have a follow-up appointment with your doctor?"     Scheduling today  Protocols used: Infection on Antibiotic Follow-up Call-[facility]

## 2023-09-02 NOTE — Patient Instructions (Signed)
Please do not take your Xanax while using the cough syrup;   Please start the new antibiotic and prednisone tomorrow. You will take 2 tablet of prednisone in the am for the next 2 days and then 1 tablet daily x 5 days;   Please follow up with your PCP if the symptoms persist- I am going to hold a CXR for now but you will need one if symptoms are not responding to change in treatment.

## 2023-09-02 NOTE — Progress Notes (Signed)
Kathryn Cummings is a 68 y.o. female with the following history as recorded in EpicCare:  Patient Active Problem List   Diagnosis Date Noted   Osteoarthritis of patellofemoral joint 07/19/2022   Statin myopathy 06/21/2022   Chest pain of uncertain etiology 06/20/2021   Prediabetes 06/09/2021   Mixed hyperlipidemia 03/09/2016   Generalized anxiety disorder 06/30/2015   Visit for preventive health examination 10/30/2014   Hx of basal cell carcinoma 09/17/2014   Migraine headache without aura 09/17/2014   Gastroesophageal reflux disease without esophagitis 09/17/2014   Insomnia 09/17/2014    Current Outpatient Medications  Medication Sig Dispense Refill   ALPRAZolam (XANAX) 0.5 MG tablet Take 1 tablet (0.5 mg total) by mouth 2 (two) times daily as needed for anxiety. 30 tablet 1   atorvastatin (LIPITOR) 10 MG tablet Take 1 tablet (10 mg total) by mouth daily. 90 tablet 3   azithromycin (ZITHROMAX) 250 MG tablet Take 2 tablets by mouth on day 1, then 1 tablet daily on days 2 through 5. 6 tablet 0   benzonatate (TESSALON) 100 MG capsule Take 1 capsule (100 mg total) by mouth 3 (three) times daily as needed. 30 capsule 0   celecoxib (CELEBREX) 200 MG capsule Take 1 capsule (200 mg total) by mouth daily. 30 capsule 2   cetirizine (ZYRTEC) 10 MG chewable tablet Chew 10 mg by mouth daily as needed for allergies.     Coenzyme Q10 (COQ-10 PO) Take by mouth.     doxycycline (VIBRA-TABS) 100 MG tablet Take 1 tablet (100 mg total) by mouth 2 (two) times daily. 14 tablet 0   ezetimibe (ZETIA) 10 MG tablet Take 1 tablet (10 mg total) by mouth daily. 90 tablet 3   fenofibrate micronized (LOFIBRA) 134 MG capsule Take 1 capsule (134 mg total) by mouth daily before breakfast. 90 capsule 3   HYDROcodone bit-homatropine (HYCODAN) 5-1.5 MG/5ML syrup Take 5 mLs by mouth every 8 (eight) hours as needed for cough. 120 mL 0   Nutritional Supplements (JUICE PLUS FIBRE) LIQD Take by mouth daily.     Omega-3 Fatty Acids  (FISH OIL) 1000 MG CAPS Take 1 capsule by mouth daily.     omeprazole (PRILOSEC) 40 MG capsule Take 1 capsule (40 mg total) by mouth daily. 90 capsule 0   predniSONE (DELTASONE) 20 MG tablet Take 2 tablets (40 mg total) by mouth daily for 2 days, THEN 1 tablet (20 mg total) daily for 5 days. 9 tablet 0   No current facility-administered medications for this visit.    Allergies: Bactrim [sulfamethoxazole-trimethoprim]  Past Medical History:  Diagnosis Date   Anxiety    Arthritis    Basal cell adenocarcinoma    Diverticulosis    Diverticulosis    Elevated cholesterol    GERD without esophagitis    Heartburn    Hiatal hernia    History of chicken pox    Insomnia    Kidney stone    1983   Migraine headache    Osteoarthritis    Lumbar spine; hands; feet   PONV (postoperative nausea and vomiting)    Prediabetes    UTI (lower urinary tract infection)     Past Surgical History:  Procedure Laterality Date   ABDOMINAL HYSTERECTOMY  1988   APPENDECTOMY  1988   BASAL CELL CARCINOMA EXCISION     Multiple   BLADDER REPAIR  2000   Mesh   BREAST BIOPSY  1990   CHOLECYSTECTOMY     EXCISION HAGLUND'S DEFORMITY WITH ACHILLES  TENDON REPAIR Right 10/30/2021   Procedure: Excision of haglund deformity, right Achilles tendon reconstruction;  Surgeon: Toni Arthurs, MD;  Location: [facility];  Service: Orthopedics;  Laterality: Right;   GASTROC RECESSION EXTREMITY Right 10/30/2021   Procedure: Right gastroc recession;  Surgeon: Toni Arthurs, MD;  Location: [facility];  Service: Orthopedics;  Laterality: Right;    Family History  Problem Relation Age of Onset   Arthritis-Osteo Mother        Living   Diabetes Mother    Hyperlipidemia Mother    Hypertension Mother    Arthritis-Osteo Father        Living   Diabetes Father    Hypertension Father    Cancer Sister        Skin   Arthritis Sister    Healthy Sister        x1   Arthritis Brother    Cancer Brother         Skin   Healthy Brother        x1   Healthy Son        x2   Breast cancer Maternal Aunt    Heart attack Maternal Grandmother    Alzheimer's disease Maternal Grandmother    Heart attack Maternal Grandfather    Dementia Paternal Grandmother    Cancer Paternal Grandfather        Liver   Pancreatic cancer Paternal Grandfather    Colon cancer Neg Hx    Colon polyps Neg Hx    Liver disease Neg Hx    Esophageal cancer Neg Hx    Stomach cancer Neg Hx    Rectal cancer Neg Hx     Social History   Tobacco Use   Smoking status: Never   Smokeless tobacco: Never  Substance Use Topics   Alcohol use: Yes    Alcohol/week: 2.0 standard drinks of alcohol    Types: 2 Standard drinks or equivalent per week    Comment: 2 times a month wine/beer    Subjective:   Patient did an e-visit 2 days ago and was diagnosed with bronchitis. She was started on a Z-pak but is concerned about persisting low grade fever/ cough; does not feel that symptoms are responding at all; does feel that she is having bronchospasms;   Objective:  Vitals:   09/02/23 1440  BP: 112/72  Pulse: 89  Temp: 98.4 F (36.9 C)  TempSrc: Oral  SpO2: 99%  Weight: 168 lb 12.8 oz (76.6 kg)  Height: 5\' 3"  (1.6 m)    General: Well developed, well nourished, in no acute distress  Skin : Warm and dry.  Head: Normocephalic and atraumatic  Eyes: Sclera and conjunctiva clear; pupils round and reactive to light; extraocular movements intact  Ears: External normal; canals clear; tympanic membranes normal  Oropharynx: Pink, supple. No suspicious lesions  Neck: Supple without thyromegaly, adenopathy  Lungs: Respirations unlabored; clear to auscultation bilaterally without wheeze, rales, rhonchi  CVS exam: normal rate and regular rhythm.  Neurologic: Alert and oriented; speech intact; face symmetrical; moves all extremities well; CNII-XII intact without focal deficit   Assessment:  1. Cough, unspecified type   2. Acute  bronchitis, unspecified organism   3. Acute bronchospasm     Plan:  Nebulizer treatment given in office with good improvement in symptoms; as a result, will hold CXR for now;  D/C zithromax- change to Doxycycline and add Prednisone; Rx for Hycodan to use at night; she understands not to take  Xanax while using the cough syrup; Increase fluids, rest; if no improvement by Monday, will update CXR;   No follow-ups on file.  No orders of the defined types were placed in this encounter.   Requested Prescriptions   Signed Prescriptions Disp Refills   predniSONE (DELTASONE) 20 MG tablet 9 tablet 0    Sig: Take 2 tablets (40 mg total) by mouth daily for 2 days, THEN 1 tablet (20 mg total) daily for 5 days.   doxycycline (VIBRA-TABS) 100 MG tablet 14 tablet 0    Sig: Take 1 tablet (100 mg total) by mouth 2 (two) times daily.   HYDROcodone bit-homatropine (HYCODAN) 5-1.5 MG/5ML syrup 120 mL 0    Sig: Take 5 mLs by mouth every 8 (eight) hours as needed for cough.

## 2023-09-02 NOTE — Progress Notes (Signed)
  Because of progressive symptoms despite treatment with antibiotics via e-visit, I feel your condition warrants further evaluation and I recommend that you be seen in a face-to-face visit.   NOTE: There will be NO CHARGE for this E-Visit   If you are having a true medical emergency, please call 911.     For an urgent face to face visit, [place] has multiple urgent care centers for your convenience.  Click the link below for the full list of locations and hours, walk-in wait times, appointment scheduling options and driving directions:  Urgent Care - [place], [place], [place], [place], [place], [place]  [place]     Your MyChart E-visit questionnaire answers were reviewed by a board certified advanced clinical practitioner to complete your personal care plan based on your specific symptoms.    Thank you for using e-Visits.

## 2023-09-03 ENCOUNTER — Other Ambulatory Visit (HOSPITAL_BASED_OUTPATIENT_CLINIC_OR_DEPARTMENT_OTHER): Payer: Self-pay

## 2023-10-08 ENCOUNTER — Other Ambulatory Visit (HOSPITAL_BASED_OUTPATIENT_CLINIC_OR_DEPARTMENT_OTHER): Payer: Self-pay

## 2023-10-12 ENCOUNTER — Other Ambulatory Visit: Payer: Self-pay

## 2023-10-12 ENCOUNTER — Other Ambulatory Visit (HOSPITAL_BASED_OUTPATIENT_CLINIC_OR_DEPARTMENT_OTHER): Payer: Self-pay

## 2023-10-18 ENCOUNTER — Other Ambulatory Visit (HOSPITAL_BASED_OUTPATIENT_CLINIC_OR_DEPARTMENT_OTHER): Payer: Self-pay

## 2023-10-27 ENCOUNTER — Other Ambulatory Visit (HOSPITAL_BASED_OUTPATIENT_CLINIC_OR_DEPARTMENT_OTHER): Payer: Self-pay

## 2023-11-03 LAB — LIPID PANEL
Chol/HDL Ratio: 2.5 ratio (ref 0.0–4.4)
Cholesterol, Total: 160 mg/dL (ref 100–199)
HDL: 63 mg/dL (ref >39)
LDL Chol Calc (NIH): 68 mg/dL (ref 0–99)
Triglycerides: 175 mg/dL — ABNORMAL HIGH (ref 0–149)
VLDL Cholesterol Cal: 29 mg/dL (ref 5–40)

## 2023-11-03 LAB — HEPATIC FUNCTION PANEL
ALT: 17 IU/L (ref 0–32)
AST: 17 IU/L (ref 0–40)
Albumin: 4.4 g/dL (ref 3.9–4.9)
Alkaline Phosphatase: 67 IU/L (ref 44–121)
Bilirubin Total: 0.3 mg/dL (ref 0.0–1.2)
Bilirubin, Direct: 0.11 mg/dL (ref 0.00–0.40)
Total Protein: 6.6 g/dL (ref 6.0–8.5)

## 2023-11-09 ENCOUNTER — Other Ambulatory Visit (HOSPITAL_BASED_OUTPATIENT_CLINIC_OR_DEPARTMENT_OTHER): Payer: Self-pay

## 2023-11-09 MED ORDER — FLUOCINONIDE 0.05 % EX SOLN
CUTANEOUS | 11 refills | Status: AC
Start: 2023-11-09 — End: ?
  Filled 2023-11-09: qty 60, 30d supply, fill #0

## 2023-11-13 DIAGNOSIS — E782 Mixed hyperlipidemia: Secondary | ICD-10-CM

## 2023-11-22 ENCOUNTER — Other Ambulatory Visit: Payer: Self-pay | Admitting: Family

## 2023-11-22 DIAGNOSIS — K219 Gastro-esophageal reflux disease without esophagitis: Secondary | ICD-10-CM

## 2023-11-26 ENCOUNTER — Encounter: Payer: Self-pay | Admitting: Family Medicine

## 2023-11-26 ENCOUNTER — Ambulatory Visit: Admitting: Psychiatry

## 2023-11-29 ENCOUNTER — Other Ambulatory Visit: Payer: Self-pay

## 2023-11-30 ENCOUNTER — Other Ambulatory Visit (HOSPITAL_BASED_OUTPATIENT_CLINIC_OR_DEPARTMENT_OTHER): Payer: Self-pay

## 2023-11-30 LAB — HM MAMMOGRAPHY

## 2023-11-30 MED ORDER — FLUOROURACIL 5 % EX CREA
TOPICAL_CREAM | CUTANEOUS | 0 refills | Status: DC
  Filled 2023-11-30: qty 40, 20d supply, fill #0

## 2023-12-01 ENCOUNTER — Other Ambulatory Visit (HOSPITAL_BASED_OUTPATIENT_CLINIC_OR_DEPARTMENT_OTHER): Payer: Self-pay

## 2023-12-01 MED ORDER — HYDROXYZINE HCL 25 MG PO TABS
25.0000 mg | ORAL_TABLET | Freq: Three times a day (TID) | ORAL | 0 refills | Status: DC | PRN
  Filled 2023-12-01: qty 20, 7d supply, fill #0

## 2023-12-01 NOTE — Telephone Encounter (Signed)
Patient is aware and refill sent.

## 2023-12-02 ENCOUNTER — Other Ambulatory Visit: Payer: Self-pay | Admitting: Family

## 2023-12-02 ENCOUNTER — Other Ambulatory Visit (HOSPITAL_BASED_OUTPATIENT_CLINIC_OR_DEPARTMENT_OTHER): Payer: Self-pay

## 2023-12-02 DIAGNOSIS — K219 Gastro-esophageal reflux disease without esophagitis: Secondary | ICD-10-CM

## 2023-12-06 ENCOUNTER — Ambulatory Visit: Admitting: Psychiatry

## 2023-12-06 DIAGNOSIS — F4323 Adjustment disorder with mixed anxiety and depressed mood: Secondary | ICD-10-CM

## 2023-12-06 NOTE — Progress Notes (Signed)
 [facility] Counsel[facility] Initial Adult Exam  Name: Kathryn Cummings Date: 12/06/2023 MRN: [id number] DOB: [birth year] PCP: Francenia Ingle, NP  Time spent: 60 minutes   Guardian/Payee:   Patient    Paperwork requested:  No   Reason for Visit /Presenting Problem:  Anxiety, grief, and depression due to husband's death on 12-Oct-2023 (rejection of bone marrow transplant and his body shut down)  Mental Status Exam:    Appearance:   Casual and Neat     Behavior:  Appropriate, Sharing, and Motivated  Motor:  Normal  Speech/Language:   Clear and Coherent  Affect:  Depressed and Tearful  Mood:  anxious, depressed, and sad  Thought process:  goal directed  Thought content:    Some rumination "but usually short-lived"  Sensory/Perceptual disturbances:    WNL  Orientation:  oriented to person, place, time/date, situation, day of week, month of year, year, and stated date of December 06, 2023  Attention:  Good  Concentration:  Good and Fair  Memory:  WNL  Fund of knowledge:   Good  Insight:    Good  Judgment:   Good  Impulse Control:  Good   Reported Symptoms:  see notes above  Risk Assessment: Danger to Self:  No Self-injurious Behavior: No Danger to Others: No Duty to Warn:no Physical Aggression / Violence:No  Access to Firearms a concern: No  Gang Involvement:No  Patient / guardian was educated about steps to take if suicide or homicide risk level increases between visits: yes While future psychiatric events cannot be accurately predicted, the patient does not currently require acute inpatient psychiatric care and does not currently meet [place]  involuntary commitment criteria.  Substance Abuse History: Current substance abuse: No     Past Psychiatric History:   Previous psychological history is significant for anorexia and depression Outpatient Providers:Carson Sarvis History of Psych Hospitalization: No  Psychological Testing:  n/a    Abuse History: Victim of No.,   none    Report needed: No. Victim of Neglect:No. Perpetrator of  n/a   Witness / Exposure to Domestic Violence: No   Protective Services Involvement: No  Witness to [employer] Violence:  No   Family History:  reviewed with patient and she confirms except brother nor sister had cancer: Family History  Problem Relation Age of Onset   Arthritis-Osteo Mother        Living   Diabetes Mother    Hyperlipidemia Mother    Hypertension Mother    Arthritis-Osteo Father        Living   Diabetes Father    Hypertension Father    Cancer Sister        Skin   Arthritis Sister    Healthy Sister        x1   Arthritis Brother    Cancer Brother        Skin   Healthy Brother        x1   Healthy Son        x2   Breast cancer Maternal Aunt    Heart attack Maternal Grandmother    Alzheimer's disease Maternal Grandmother    Heart attack Maternal Grandfather    Dementia Paternal Grandmother    Cancer Paternal Grandfather        Liver   Pancreatic cancer Paternal Grandfather    Colon cancer Neg Hx    Colon polyps Neg Hx    Liver disease Neg Hx    Esophageal cancer Neg Hx  Stomach cancer Neg Hx    Rectal cancer Neg Hx     Living situation: the patient lives alone in a townhome, has close neighbors  Sexual Orientation:  Straight  Relationship Status: widowed; 2 sons ages 28 and 65. 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  Religion/Sprituality/World View:   Protestant  Any cultural differences that may affect / interfere with treatment:  not applicable   Recreation/Hobbies: art hand-lettering,  reading  Stressors:Loss of husband to death; current probate issues re: unexpected death of deceased husband's father    Strengths:  Supportive Relationships, Family, Friends, Warehouse manager, Spirituality, Hopefulness, Journalist, newspaper, and Able to Communicate Effectively  Barriers:  "Myself"- due to my tendency to be ultra independent with my grief  Legal History: Pending legal issue / charges: The patient has no significant history of legal issues. History of legal issue / charges:  none  Medical History/Surgical History: (Reviewed and patient confirms.) Past Medical History:  Diagnosis Date   Anxiety    Arthritis    Basal cell adenocarcinoma    Diverticulosis    Diverticulosis    Elevated cholesterol    GERD without esophagitis    Heartburn    Hiatal hernia    History of chicken pox    Insomnia    Kidney stone    1983   Migraine headache    Osteoarthritis    Lumbar spine; hands; feet   PONV (postoperative nausea and vomiting)    Prediabetes    UTI (lower urinary tract infection)     Past Surgical History:  Procedure Laterality Date   ABDOMINAL HYSTERECTOMY  1988   APPENDECTOMY  1988   BASAL CELL CARCINOMA EXCISION     Multiple   BLADDER REPAIR  2000   Mesh   BREAST BIOPSY  1990   CHOLECYSTECTOMY     EXCISION HAGLUND'S DEFORMITY WITH ACHILLES TENDON REPAIR Right 10/30/2021   Procedure: Excision of haglund deformity, right Achilles tendon reconstruction;  Surgeon: Amada Backer, MD;  Location: [facility];  Service: Orthopedics;  Laterality: Right;   GASTROC RECESSION EXTREMITY Right 10/30/2021   Procedure: Right gastroc recession;  Surgeon: Amada Backer, MD;  Location: [facility];  Service: Orthopedics;  Laterality: Right;    Medications: Current Outpatient Medications  Medication Sig Dispense Refill   ALPRAZolam  (XANAX ) 0.5 MG tablet Take 1 tablet (0.5 mg total) by mouth 2 (two) times daily as needed for anxiety. 30 tablet 1   atorvastatin   (LIPITOR) 10 MG tablet Take 1 tablet (10 mg total) by mouth daily. 90 tablet 3   benzonatate  (TESSALON ) 100 MG capsule Take 1 capsule (100 mg total) by mouth 3 (three) times daily as needed. 30 capsule 0   celecoxib  (CELEBREX ) 200 MG capsule Take 1 capsule (200 mg total) by mouth daily. 30 capsule 2   cetirizine (ZYRTEC) 10 MG chewable tablet Chew 10 mg by mouth daily as needed for allergies.     Coenzyme Q10 (COQ-10 PO) Take by mouth.     doxycycline  (VIBRA -TABS) 100 MG tablet Take 1 tablet (100  mg total) by mouth 2 (two) times daily. 14 tablet 0   ezetimibe  (ZETIA ) 10 MG tablet Take 1 tablet (10 mg total) by mouth daily. 90 tablet 3   fenofibrate  micronized (LOFIBRA) 134 MG capsule Take 1 capsule (134 mg total) by mouth daily before breakfast. 90 capsule 3   fluocinonide  (LIDEX ) 0.05 % external solution Apply 1 application to the scalp at bedtime x 1-2 weeks as needed for itching and scaling 60 mL 11   fluorouracil  (EFUDEX ) 5 % cream Apply a small amount to affected area(s) on upper back twice daily for 2 weeks. 40 g 0   HYDROcodone  bit-homatropine (HYCODAN) 5-1.5 MG/5ML syrup Take 5 mLs by mouth every 8 (eight) hours as needed for cough. 120 mL 0   hydrOXYzine  (ATARAX ) 25 MG tablet Take 1 tablet (25 mg total) by mouth every 8 (eight) hours as needed. 20 tablet 0   Nutritional Supplements (JUICE PLUS FIBRE) LIQD Take by mouth daily.     Omega-3 Fatty Acids (FISH OIL) 1000 MG CAPS Take 1 capsule by mouth daily.     omeprazole  (PRILOSEC) 40 MG capsule Take 1 capsule (40 mg total) by mouth daily. 90 capsule 0   No current facility-administered medications for this visit.    Allergies  Allergen Reactions   Bactrim [Sulfamethoxazole-Trimethoprim] Rash    Diagnoses:    ICD-10-CM   1. Situational mixed anxiety and depressive disorder  F43.23      Treatment goal plan of care: Patient not signing treatment plan on computer screen due to continued concerns with COVID and other illnesses.  We  did work collaboratively on her treatment plan and she is in agreement with it.  Goals remain on treatment plan as patient works with strategies in sessions and outside of sessions to meet her goals.  Progress is assessed each session and documented in the "plan" or "progress" sections of treatment note.  Identify life losses and/or situations including from the past and in the present, that support current symptomology of anxiety/depression. 2.   Develop behavioral and cognitive strategies to reduce and work through excessive sadness, anxiety or depression.  Identify, challenge, and replace negative self talk with more positive, realistic, and empowering self talk. 3.    Patient will work on developing reality based, positive cognitive messages that can help improve her mood and outlook while also working on building self-confidence in her ability to work through significant loss   Plan of Care:  Today is the first session for this patient with therapist.  Manha Amato is a retired, widowed, 2 year old mother of 2 adult sons, and 4 grandchildren (11 yr twin boys, 26 year old girl, and 85 yr old boy). Both sons have supportive wives and patient is close to them. Supportive friends from [place] and locally, friends in townhome where patient lives and beyond.  Next door neighbor is close to patient, and another neighboring couple have been extremely supportive. Patient's primary reason for seeking help currently is due to death of her husband in 02-13-2025due to "bone marrow transplant rejection and husband's body shut down." Patient and husband had been married 46 yrs. she did not has any SI.  Has a brother 3 yrs younger than patient, and is married and has a daughter. Has a sister who is 8 yrs younger than patient with 2 kids and is in her 3rd marriage. Patient formerly worked as a Engineer, civil (consulting). Hobbies reading, crafting, being with family. Used to travel often and loved it. My biggest  concerns include: "figuring  out how to deal with my grief, not be as tearful, be happy again, enjoy and be there for my family again, and not feel the overwhelming weight of sadness.  "Span of time that husband was sick is 12 yrs and there are layers of stuff for me to get through." Lost mom and dad and grieved but this has been more difficult.  Multiple grief issues over time and feeling motivated at this point to work on them.  Patient encouraged to do some journaling as she is able between now and next session.  For further information, please see above sections of this initial evaluation for therapy.  Review of patient's initial treatment goal plan and she is in agreement.  Next appointment within approximately 1 to 2 weeks.   Kelleen Patee, LCSW

## 2023-12-10 ENCOUNTER — Other Ambulatory Visit (HOSPITAL_BASED_OUTPATIENT_CLINIC_OR_DEPARTMENT_OTHER): Payer: Self-pay

## 2023-12-10 ENCOUNTER — Ambulatory Visit (INDEPENDENT_AMBULATORY_CARE_PROVIDER_SITE_OTHER): Admitting: Family Medicine

## 2023-12-10 ENCOUNTER — Encounter: Payer: Self-pay | Admitting: Family Medicine

## 2023-12-10 VITALS — BP 114/82 | HR 73 | Temp 98.1°F | Ht 63.0 in | Wt 162.0 lb

## 2023-12-10 DIAGNOSIS — Z634 Disappearance and death of family member: Secondary | ICD-10-CM | POA: Diagnosis not present

## 2023-12-10 DIAGNOSIS — E782 Mixed hyperlipidemia: Secondary | ICD-10-CM

## 2023-12-10 DIAGNOSIS — F411 Generalized anxiety disorder: Secondary | ICD-10-CM

## 2023-12-10 MED ORDER — BUSPIRONE HCL 5 MG PO TABS
5.0000 mg | ORAL_TABLET | Freq: Two times a day (BID) | ORAL | 0 refills | Status: DC
  Filled 2023-12-10: qty 60, 30d supply, fill #0

## 2023-12-10 NOTE — Progress Notes (Signed)
 Established Patient Office Visit   Subjective:  Patient ID: Kathryn Cummings, female    DOB: 1955/10/18  Age: 68 y.o. MRN: [id number]  Chief Complaint  Patient presents with   Medical Management of Chronic Issues    HPI Patient is here for a 6 month follow up. However, patient lost her spouse on 09/16/2023. Since his passing, she had sent a message on MyChart about coping with his loss and experiencing an increase in anxiety. Patient already takes Alprazolam  0.5mg  2-3 times a month when she can't sleep, even though it is prescribed differently. The medication can cause her to be drowsy, through messaging decided to half medication to see if would help with her anxiety during the day. She tried it, but still made her drowsy. Prescribed Hydroxyzine  25mg  tablet, 1 tablet TID PRN. She reports this past Monday was very hard for her prior to going to go her first counseling session. She took the medication and it helped take the edge off a little. She previously was on Fluoxetine  when her spouse had to got sick years ago and got off of it in the fall. She does not want to go back on this medication and feels like PRN medications are not a great fix for her. She would like to try Buspar  as scheduled.   Recently had labs with cardiology. Triglycerides were elevated and recommended to increase Fenofibrate  to 150mg  from 134mg . She is suppose to have labs  redrawn in 3 months. Still taking Atorvastatin  10mg  daily and Ezetimibe  10mg  daily.  ROS See HPI above     Objective:   BP 114/82   Pulse 73   Temp 98.1 F (36.7 C) (Oral)   Ht 5\' 3"  (1.6 m)   Wt 162 lb (73.5 kg)   SpO2 96%   BMI 28.70 kg/m    Physical Exam Vitals reviewed.  Constitutional:      General: She is not in acute distress.    Appearance: Normal appearance. She is not ill-appearing, toxic-appearing or diaphoretic.  Eyes:     General:        Right eye: No discharge.        Left eye: No discharge.     Conjunctiva/sclera: Conjunctivae  normal.  Pulmonary:     Effort: Pulmonary effort is normal. No respiratory distress.  Musculoskeletal:        General: Normal range of motion.  Skin:    General: Skin is warm and dry.  Neurological:     General: No focal deficit present.     Mental Status: She is alert and oriented to person, place, and time. Mental status is at baseline.  Psychiatric:        Mood and Affect: Mood is anxious. Affect is tearful.        Behavior: Behavior normal.        Thought Content: Thought content normal.        Judgment: Judgment normal.      Assessment & Plan:  Generalized anxiety disorder Assessment & Plan: -Discontinued Hydroxyzine  for anxiety. Prescribed Buspar  5mg  tablet, take 1 tablet twice a day to see if this helps maintain anxiety. While initially starting medication, recommend to not take Alprazolam  to see how Buspar  does to help her. -Will need to do UDS at next visit for management of Alprazolam .  -Continue with counseling.   Orders: -     busPIRone  HCl; Take 1 tablet (5 mg total) by mouth 2 (two) times daily.  Dispense: 60 tablet; Refill:  0  Mixed hyperlipidemia  Spouse deceased  -Discussed about increasing Fenofibrate . Through shared decision making, decided to hold on increasing medication and will have labs drawn in 3 months. If still elevated then, will increase medication.  -Follow up in 2 weeks. If you are doing okay and stable, you can cancel appointment, reschedule in 2 more weeks (would be a month out).    Return in about 2 weeks (around 12/24/2023) for follow-up.   Tevin Shillingford, NP

## 2023-12-10 NOTE — Patient Instructions (Signed)
-  It was great to be able to spend some extra time with you today. I am here if I can help you during this season.  -I am glad you are going to counseling.  -Discontinued Hydroxyzine  for anxiety. Prescribed Buspar  5mg  tablet, take 1 tablet twice a day to see if this helps maintain your anxiety. While initially starting medication, recommend to not take Alprazolam  to see how Buspar  does to help you.  -Discussed about increasing Fenofibrate . Through shared decision making, decided to hold on increasing medication and will have labs drawn in 3 months. If still elevated then, will increase medication.  -Follow up in 2 weeks. If you are doing okay and stable, you can cancel appointment, reschedule in 2 more weeks (would be a month out).

## 2023-12-10 NOTE — Assessment & Plan Note (Addendum)
-  Discontinued Hydroxyzine  for anxiety. Prescribed Buspar  5mg  tablet, take 1 tablet twice a day to see if this helps maintain anxiety. While initially starting medication, recommend to not take Alprazolam  to see how Buspar  does to help her. -Will need to do UDS at next visit for management of Alprazolam .  -Continue with counseling.

## 2023-12-14 ENCOUNTER — Ambulatory Visit: Payer: Medicare Other | Admitting: Family Medicine

## 2023-12-15 ENCOUNTER — Ambulatory Visit (INDEPENDENT_AMBULATORY_CARE_PROVIDER_SITE_OTHER): Admitting: Psychiatry

## 2023-12-15 DIAGNOSIS — F4323 Adjustment disorder with mixed anxiety and depressed mood: Secondary | ICD-10-CM | POA: Diagnosis not present

## 2023-12-15 NOTE — Progress Notes (Signed)
      [facility] Counselor/Therapist Progress Note  Patient ID: Kathryn Cummings, MRN: [id number],    Date: 12/15/2023  Time Spent: 58 minutes   Treatment Type: Individual Therapy  Reported Symptoms: anxiety, grief, depression due to husband's death ( rejection of bone marrow transplant and his body shut down), but depression is improving some   Mental Status Exam:  Appearance:   Casual and Neat     Behavior:  Appropriate, Rigid, and Motivated  Motor:  Normal  Speech/Language:   Clear and Coherent  Affect:  Anxious, grieving  Mood:  anxious  Thought process:  goal directed  Thought content:    WNL  Sensory/Perceptual disturbances:    WNL  Orientation:  oriented to person, place, time/date, situation, day of week, month of year, year, and stated date of December 15, 2023  Attention:  Fair  Concentration:  Good and better for short periods of time  Memory:  WNL  Fund of knowledge:   Good  Insight:    Good  Judgment:   Good  Impulse Control:  Good   Risk Assessment: Danger to Self:  No Self-injurious Behavior: No Danger to Others: No Duty to Warn:no Physical Aggression / Violence:No  Access to Firearms a concern: No  Gang Involvement:No   Subjective: Today in session showing good motivation and participation after completing her initial evaluation at her prior session and providing a lot of of good information about her history and current status especially regarding the struggles that led her to come to therapy. Working well on her grief, although difficult. Tearfulness off and on during session today. Trying not to compare herself to others in grief. Neighbors and family are helpful and supportive. Sons also supportive. Sees some "normalcy" in this whole grief process. "Layers of grief and other issues to get through". But feeling more hope at times. To do some journaling between sessions and bring in next session. Denies any SI.   Interventions: Cognitive Behavioral Therapy and  Ego-Supportive Identify life losses and/or situations including from the past and in the present, that support current symptomology of anxiety/depression. 2.   Develop behavioral and cognitive strategies to reduce and work through excessive sadness, anxiety or depression.  Identify, challenge, and replace negative self talk with more positive, realistic, and empowering self talk. 3.    Patient will work on developing reality based, positive cognitive messages that can help improve her mood and outlook while also working on building self-confidence in her ability to work through significant loss  Diagnosis:   ICD-10-CM   1. Situational mixed anxiety and depressive disorder  F43.23      Plan:  Patient alert and active and in appointment today focusing further on her grief over loss of husband.  Working well with strategies, and using her faith as a resource also which has been very helpful to her.  Significant tearfulness but also realizes how some of that is healing for her.  Family remains very supportive as patient continues working through "layers of grief" showing a lot of courage and faith in herself and others to stand by her.  Goal review and progress/challenges noted with patient.  Next appointment within 1-2 weeks.   Kelleen Patee, LCSW

## 2023-12-24 ENCOUNTER — Ambulatory Visit (INDEPENDENT_AMBULATORY_CARE_PROVIDER_SITE_OTHER): Admitting: Family Medicine

## 2023-12-24 ENCOUNTER — Encounter: Payer: Self-pay | Admitting: Family Medicine

## 2023-12-24 ENCOUNTER — Ambulatory Visit: Admitting: Psychiatry

## 2023-12-24 VITALS — BP 116/74 | HR 85 | Temp 97.9°F | Ht 63.0 in | Wt 163.0 lb

## 2023-12-24 DIAGNOSIS — F411 Generalized anxiety disorder: Secondary | ICD-10-CM | POA: Diagnosis not present

## 2023-12-24 DIAGNOSIS — F4323 Adjustment disorder with mixed anxiety and depressed mood: Secondary | ICD-10-CM | POA: Diagnosis not present

## 2023-12-24 DIAGNOSIS — Z634 Disappearance and death of family member: Secondary | ICD-10-CM

## 2023-12-24 MED ORDER — BUSPIRONE HCL 5 MG PO TABS: ORAL_TABLET | ORAL | Status: DC

## 2023-12-24 NOTE — Progress Notes (Signed)
 [facility] Counselor/Therapist Progress Note  Patient ID: Kathryn Cummings, MRN: [id number],    Date: 12/24/2023  Time Spent: 58 minutes   Treatment Type: Individual Therapy  Reported Symptoms: anxiety, grief, depression due to husband's death (rejection of bone marrow transplant and his body shut down), depression is improving some   Mental Status Exam:  Appearance:   Casual and Neat     Behavior:  Appropriate, Sharing, and Motivated  Motor:  Normal  Speech/Language:   Clear and Coherent  Affect:  Depressed and anxious  Mood:  anxious and depressed  Thought process:  goal directed  Thought content:    Less ruminating  Sensory/Perceptual disturbances:    WNL  Orientation:  oriented to person, place, time/date, situation, day of week, month of year, year, and stated date of Dec 24, 2023  Attention:  Good  Concentration:  Good  Memory:  WNL  Fund of knowledge:   Good  Insight:    Good  Judgment:   Good  Impulse Control:  Good   Risk Assessment: Danger to Self:  No Self-injurious Behavior: No Danger to Others: No Duty to Warn:no Physical Aggression / Violence:No  Access to Firearms a concern: No  Gang Involvement:No   Subjective:   Patient in session today actively participating in focusing further on some struggles she has experienced particularly since her husband's death. "Staying busy"  and having challenges in doing "all the things at home that husband used to do".  Tries not to compare herself to other people in grief.  Her family and sons are very supportive.  States that "it feels like layers of grief and other issues to get through their difficult".  Does have more belief in herself and that she will get through this as she feels she has made a good connection in therapy and also has good support in her family and friends.  Sharing today the struggles she is having with more alone time. "Some days can be really hard unexpectedly but overall not quite as bad with my  sadness and tearfulness."  Feeling some "normalcy" as she continues working on her goals.  Working steadily on her grief and starting to notice more improvement and sharing some specific goals.  Feels more hope and motivation has increased.  Actively uses her journaling which is also helpful.*Is concerned about some fears of driving since husband's death but gradually getting some better. Issues of "aloneness" addressed more today we will follow-up more on this next session.   Interventions: Cognitive Behavioral Therapy and Ego-Supportive  Identify life losses and/or situations including from the past and in the present, that support current symptomology of anxiety/depression. 2.   Develop behavioral and cognitive strategies to reduce and work through excessive sadness, anxiety or depression.  Identify, challenge, and replace negative self talk with more positive, realistic, and empowering self talk. 3.    Patient will work on developing reality based, positive cognitive messages that can help improve her mood and outlook while also working on building self-confidence in her ability to work through significant loss  Diagnosis:   ICD-10-CM   1. Situational mixed anxiety and depressive disorder  F43.23      Plan: Patient working well in session today as she continues to process and work through the grief over the loss of her husband and also the loss of a sister.  Motivation has improved more and she is working well with strategies discussed in sessions and her faith is also a  helpful resource for her.  Not as much tearfulness today and does sense "some healing".  Is fortunate that she has a very supportive family as she continues working through her grief knowing that she has their support as they have actually been grieving also, and continue to stand by her and encouraged her in the process.  Goal review and progress/challenges noted with patient.  Next appointment within 2 weeks.   Kelleen Patee, LCSW

## 2023-12-24 NOTE — Patient Instructions (Addendum)
-  It was great to see you today and I am so proud of how far you have come.  -Recommend to continue with taking Buspar  5mg  in the AM, but increase Buspar  to 10mg  in the evening. Take 2 tablets in the evening. Please send me message with an update on how you are tolerating the evening dose and when she needs a refill with new directions.  -Follow up in 6 weeks. Will do labs that cardiologist is requesting. Please be fasting.

## 2023-12-24 NOTE — Progress Notes (Signed)
   Established Patient Office Visit   Subjective:  Patient ID: Kathryn Cummings, female    DOB: 1956-02-11  Age: 68 y.o. MRN: [id number]  Chief Complaint  Patient presents with   Medical Management of Chronic Issues    Anxiety     HPI Anxiety: On previous appointment, prescribed Buspar  5mg  tablet BID. Also, takes Alprazolam  0.5mg  tablet maybe 2-3 times a month if she could not sleep and woke up during the night. She has not been taking Alprazolam  while taking Buspar . She reports she feels like the Buspar  is helping her. She has recently lost her spouse on 09/16/2023. She is having anxiety with being alone and overcoming the loss of her spouse. She is not crying as much as before, doing more things than previously. She was able to go to the beach last week and spend some time with her sister. Still having a hard time sleeping at night and helping her mind calm down when she is going to sleep. She is in counseling with Kelleen Patee with [facility]. This has been beneficial too.  ROS See HPI above     Objective:   BP 116/74   Pulse 85   Temp 97.9 F (36.6 C) (Oral)   Ht 5\' 3"  (1.6 m)   Wt 163 lb (73.9 kg)   SpO2 96%   BMI 28.87 kg/m    Physical Exam Vitals reviewed.  Constitutional:      General: She is not in acute distress.    Appearance: Normal appearance. She is not ill-appearing, toxic-appearing or diaphoretic.  HENT:     Head: Normocephalic and atraumatic.  Eyes:     General:        Right eye: No discharge.        Left eye: No discharge.     Conjunctiva/sclera: Conjunctivae normal.  Cardiovascular:     Rate and Rhythm: Normal rate.  Pulmonary:     Effort: Pulmonary effort is normal. No respiratory distress.  Musculoskeletal:        General: Normal range of motion.  Skin:    General: Skin is warm and dry.  Neurological:     General: No focal deficit present.     Mental Status: She is alert and oriented to person, place, and time. Mental status is at  baseline.  Psychiatric:        Mood and Affect: Mood is anxious. Affect is tearful (Teared up when talking).        Behavior: Behavior normal.        Thought Content: Thought content normal.        Judgment: Judgment normal.      Assessment & Plan:  Generalized anxiety disorder -     busPIRone  HCl; Take 1 tablet in the morning and take 2 tablets in the evening.  Spouse deceased  -Recommend to continue with taking Buspar  5mg  in the AM, but increase Buspar  to 10mg  in the evening. Take 2 tablets in the evening. Advised to please send a message with an update on how she is tolerating the evening dose and when she needs a refill with new directions.  -Patient does not need a refill on Alprazolam . Therefore, no UDS needed.  -Follow up in 6 weeks. Will do labs that cardiologist is requesting. Please be fasting.   Return in about 6 weeks (around 02/04/2024).   Azusena Erlandson, NP

## 2023-12-29 ENCOUNTER — Ambulatory Visit: Admitting: Psychiatry

## 2023-12-29 DIAGNOSIS — F4323 Adjustment disorder with mixed anxiety and depressed mood: Secondary | ICD-10-CM

## 2023-12-29 NOTE — Progress Notes (Signed)
 [facility] Counselor/Therapist Progress Note  Patient ID: Kathryn Cummings, MRN: [id number],    Date: 12/29/2023  Time Spent: 53 minutes   Treatment Type: Individual Therapy  Reported Symptoms: anxiety, grief, depression re: husband's death (rejection of bone marrow transplant and his body shut down), depression is improving some slowly   Mental Status Exam:  Appearance:   Neat and Well Groomed     Behavior:  Appropriate, Sharing, and Motivated  Motor:  Normal  Speech/Language:   Clear and Coherent  Affect:  Depressed and anxiety  Mood:  anxious and depressed  Thought process:  goal directed  Thought content:    WNL  Sensory/Perceptual disturbances:    WNL  Orientation:  oriented to person, place, time/date, situation, day of week, month of year, year, and stated date of Dec 29, 2023  Attention:  Good  Concentration:  Good  Memory:  WNL  Fund of knowledge:   Good  Insight:    Good  Judgment:   Good  Impulse Control:  Good   Risk Assessment: Danger to Self:  No Self-injurious Behavior: No Danger to Others: No Duty to Warn:no Physical Aggression / Violence:No  Access to Firearms a concern: No  Gang Involvement:No   Subjective:   Patient today participating actively in session working further on multiple struggles that she is experiencing particularly since her husband's death. Talking further about her layers of grief. Second-guesses herself at time. Good contacts with family. Planning family time away and having mixed feelings of grief which she needed most of session to process today. Is trying to stay active with friends and enjoys her little dog. Biggest issues now has to do with "being by myself as I've never been a single person living by herself" and is working further on this. To continue working on "aloneness, and being alone".  Difficult changes for patient but her attitude is good and she is working hard on her issues in order to move forward. Working harder to not  compare herself to others and their grief. Loves her supportive family friends and allowing them to be supportive especially during this time of working on her grief and trying to move forward.  Noticeable progress and she is able to notice it some also.  Working more today on ways to better manage "alone time".  Does feel some more normalcy as she continues to work on her grief and trying to take steps forward.  Feeling increased hope at times.  Using her journaling actively which is helping.  Experiencing some closeness to her husband and her thoughts and that is a positive.  Has hope for the future and her getting better.  Working consistently on her goals and is definitely showing progress.  Interventions: Cognitive Behavioral Therapy and Ego-Supportive Identify life losses and/or situations including from the past and in the present, that support current symptomology of anxiety/depression. 2.   Develop behavioral and cognitive strategies to reduce and work through excessive sadness, anxiety or depression.  Identify, challenge, and replace negative self talk with more positive, realistic, and empowering self talk. 3.    Patient will work on developing reality based, positive cognitive messages that can help improve her mood and outlook while also working on building self-confidence in her ability to work through significant loss  Diagnosis:   ICD-10-CM   1. Situational mixed anxiety and depressive disorder  F43.23      Plan:  Patient in session today showing good motivation and working further on  her grief regarding the loss of her husband and also some that is related to the loss of his sister where there has been some unresolved grief.  Is active and having frequent contact with friends and extended family.  Her little dog is also a very strong support.  Motivation is good.  Some tearfulness off and on in session today.  Very aware that her faith is a strong source of support for her.  Supportive  family remains in contact with patient.  Still some acute sadness however also noticing more of her progress in her strength.  Review and progress/challenges noted with patient.  Next appointment within 2 weeks.   Kelleen Patee, LCSW

## 2024-01-03 ENCOUNTER — Encounter: Payer: Self-pay | Admitting: Family Medicine

## 2024-01-03 DIAGNOSIS — F411 Generalized anxiety disorder: Secondary | ICD-10-CM

## 2024-01-03 MED ORDER — BUSPIRONE HCL 5 MG PO TABS: ORAL_TABLET | ORAL | Status: DC

## 2024-01-04 ENCOUNTER — Other Ambulatory Visit: Payer: Self-pay | Admitting: Family Medicine

## 2024-01-04 ENCOUNTER — Other Ambulatory Visit (HOSPITAL_BASED_OUTPATIENT_CLINIC_OR_DEPARTMENT_OTHER): Payer: Self-pay

## 2024-01-04 DIAGNOSIS — F411 Generalized anxiety disorder: Secondary | ICD-10-CM

## 2024-01-04 MED ORDER — BUSPIRONE HCL 5 MG PO TABS
5.0000 mg | ORAL_TABLET | Freq: Two times a day (BID) | ORAL | 0 refills | Status: DC
  Filled 2024-01-04: qty 60, 30d supply, fill #0

## 2024-01-06 ENCOUNTER — Ambulatory Visit (INDEPENDENT_AMBULATORY_CARE_PROVIDER_SITE_OTHER): Admitting: Psychiatry

## 2024-01-06 DIAGNOSIS — F4323 Adjustment disorder with mixed anxiety and depressed mood: Secondary | ICD-10-CM | POA: Diagnosis not present

## 2024-01-06 NOTE — Progress Notes (Signed)
 [facility] Counselor/Therapist Progress Note  Patient ID: Kathryn Cummings, MRN: [id number],    Date: 01/06/2024  Time Spent: 55 minutes   Treatment Type: Individual Therapy  Reported Symptoms: anxiety, sadness, grief, depression re: husband's death after bone marrow transplant that wasn't successful     Mental Status Exam:  Appearance:   Casual, Neat, and Well Groomed     Behavior:  Appropriate, Sharing, and Motivated  Motor:  Normal  Speech/Language:   Clear and Coherent  Affect:  Depressed, Tearful, and sadness  Mood:  depressed and sad  Thought process:  normal  Thought content:    WNL  Sensory/Perceptual disturbances:    WNL  Orientation:  oriented to person, place, time/date, situation, day of week, month of year, year, and stated date of Jan 06, 2024  Attention:  Good  Concentration:  Good  Memory:  WNL  Fund of knowledge:   Good  Insight:    Good  Judgment:   Good  Impulse Control:  Good   Risk Assessment: Danger to Self:  No Self-injurious Behavior: No Danger to Others: No Duty to Warn:no Physical Aggression / Violence:No  Access to Firearms a concern: No  Gang Involvement:No   Subjective:  Patient showing good efforts although not feeling very motivated "due to her sadness and loss" of husband. Shared an article she read recently and felt very connected to it and feeling in an "in between place and drifting and not knowing where you are."  Processed this more with patient which seemed to be helpful in helping her feel connected and that and spite of difficult moments or days, she is making progress in her grief and that it is okay to be different from others because none of us  are exactly alike in how we process grief.  Had been reluctant to use her meds for sleep but now is willing and using her meds and spoke with Dr.  Although struggling with sadness at the recent loss of husband, patient very openly sharing today and also able to be aware of some progress she  is making.  Will be out of town with family this weekend and feels that might be a challenging time of missing her husband because he always loved family events.  Encouraged patient in the strength encouraged that she is showing and to know that she does not have to be any certain way for other people and that fortunately this weekend's trip she will be with close family members and they will be there for each other as they are all going through their grief currently, although in different ways.  To follow-up next visit about her "different layers of grief" that she is experiencing.  Still second-guessing herself but is also showing more self understanding at times.  Good contact with friends and family as she continues processing her grief.  Still working to not compare herself to others and their grief.  States that she does feel some normalcy at times, and then at other times questions herself on her grief as she tries to stay steps forward and is doing so.  Uses journaling to help her manage her grief and is at times, feeling some increased hope.  Continues to be goal-directed and is making progress.   Interventions: Cognitive Behavioral Therapy, Ego-Supportive, and Grief Therapy Identify life losses and/or situations including from the past and in the present, that support current symptomology of anxiety/depression. 2.   Develop behavioral and cognitive strategies to reduce and  work through excessive sadness, anxiety or depression.  Identify, challenge, and replace negative self talk with more positive, realistic, and empowering self talk. 3.    Patient will work on developing reality based, positive cognitive messages that can help improve her mood and outlook while also working on building self-confidence in her ability to work through significant loss    Diagnosis:   ICD-10-CM   1. Situational mixed anxiety and depressive disorder  F43.23      Plan:     Today patient in for session working  further on her grief regarding the loss of her husband, in addition to the loss of his sister with whom patient had been close, both of which she is carrying some unresolved grief.  Patient remains very active and in touch with other people including extended family and a number of friends.  Her motivation is good and appropriately shares some tearfulness off and on in sessions.  She does find her little dog to be a very strong source of comfort and support.  Also her faith is quite strong and has good friends that are also a real support for her.  Does continue to experience some acute sadness and noticing that she seems to be managing some better in terms of comforting herself and the times of acute struggling do not seem to "go on quite as long" as they they have been recently.  Goal review and progress/challenges noted with patient.  Next appointment within 2 weeks.   Kelleen Patee, LCSW

## 2024-01-11 ENCOUNTER — Other Ambulatory Visit (HOSPITAL_BASED_OUTPATIENT_CLINIC_OR_DEPARTMENT_OTHER): Payer: Self-pay

## 2024-01-11 ENCOUNTER — Encounter: Payer: Self-pay | Admitting: Family Medicine

## 2024-01-17 ENCOUNTER — Other Ambulatory Visit: Payer: Self-pay | Admitting: Family

## 2024-01-17 DIAGNOSIS — K219 Gastro-esophageal reflux disease without esophagitis: Secondary | ICD-10-CM

## 2024-01-18 ENCOUNTER — Ambulatory Visit: Admitting: Psychiatry

## 2024-01-20 ENCOUNTER — Ambulatory Visit (INDEPENDENT_AMBULATORY_CARE_PROVIDER_SITE_OTHER): Admitting: Psychiatry

## 2024-01-20 DIAGNOSIS — F4323 Adjustment disorder with mixed anxiety and depressed mood: Secondary | ICD-10-CM

## 2024-01-20 NOTE — Progress Notes (Signed)
 [facility] Counselor/Therapist Progress Note  Patient ID: Kathryn Cummings, MRN: [id number],    Date: 01/20/2024  Time Spent: 55 minutes   Treatment Type: Individual Therapy  Reported Symptoms: anxiety, grief, sadness, depression re: husband's death after bone marrow transplant that wasn't successful    Mental Status Exam:  Appearance:   Casual and Well Groomed     Behavior:  Appropriate, Sharing, and Motivated  Motor:  Normal  Speech/Language:   Clear and Coherent  Affect:  Appropriate  Mood:  anxious and appropriate  Thought process:  goal directed  Thought content:    Rumination  Sensory/Perceptual disturbances:    WNL  Orientation:  oriented to person, place, time/date, situation, day of week, month of year, year, and stated date of January 20, 2024  Attention:  Good  Concentration:  Good  Memory:  WNL  Fund of knowledge:   Good  Insight:    Good  Judgment:   Good  Impulse Control:  Good   Risk Assessment: Danger to Self:  No Self-injurious Behavior: No Danger to Others: No Duty to Warn:no Physical Aggression / Violence:No  Access to Firearms a concern: No  Gang Involvement:No   Subjective:    Patient today in session sharing some improvements in her grief but also the challenges. Is noting some progress with her grief but still very difficult. Really good friends and family remaining supportive of patient. Shared and talked about her tendency to "compartmentalize" in her anticipatory grief and did really well today in session working further on this as she shared more directly some of her grief experiences and thought patterns, both helpful and unhelpful.  Despite the difficulty in her grief, she feels very fortunate to have had her wonderful family and some wonderful friends standing by her during this difficult time.  Trying to accept more that this is a process as she is noticing that she will do well for couple days and then have a bad day or a bad afternoon, and then  maybe the next day would be better.  Her do vary at times depending on how her day is going.  Shared that she is able to look back from the beginning where her husband was quite sick and then died and how difficult things were, and is seeing the progress that she has made and continues to make even though she is still having times of struggle.  Responding well in treatment and is very motivated and following through in her work with treatment strategies.  Does not feel that she is drifting quite as much and instead feels that her insight and overall review of herself in this process is more positive.  He is to be out of town with family this weekend as they celebrate several family graduations with her grandchildren.  Continue to encourage patient in the strength that she is showing and to know that she can "just be herself" and does not have to be a certain way for other people as the people that care about her except her for herself and understand that she is going through significant grief and doing the best she can.  Less second-guessing of herself.  Discussed some more about "layers of grief" today which was helpful.  Trying not to compare herself to others and their grief.  Is starting to feel more "normal" at times.  Continues to use journaling to help her manage some of her grief and also be able to share in her therapy  sessions and this seems to be contributing to some increased hope within patient.  She is goal-directed and is making progress moving forward with more strength and determination.   Interventions: Cognitive Behavioral Therapy, Solution-Oriented/Positive Psychology, Ego-Supportive, and Grief Therapy Identify life losses and/or situations including from the past and in the present, that support current symptomology of anxiety/depression. 2.   Develop behavioral and cognitive strategies to reduce and work through excessive sadness, anxiety or depression.  Identify, challenge, and replace  negative self talk with more positive, realistic, and empowering self talk. 3.    Patient will work on developing reality based, positive cognitive messages that can help improve her mood and outlook while also working on building self-confidence in her ability to work through significant loss   Diagnosis:   ICD-10-CM   1. Situational mixed anxiety and depressive disorder  F43.23      Plan:  Patient working further today in session regarding the grief and the loss of her husband, in addition to the loss of her sister-in-law with whom patient had been very close, and both of which she is caring some unresolved grief.  Patient has a good support system and remains very much in touch with other people and extended family including grandchildren and a number of friends.  The pain of the loss of her husband remains quite strong although she also notices more of her progress.  Motivation remains good and able to more comfortably share her tearfulness, some joy of having had her husband for a number of years, and also more importantly communicating some hope for the future.  Her pet dog is a big source of comfort and support for her as well as her faith and the good friends that are very supportive of her.  Continues to experience significant grief and sadness although notices some decrease in that as well as feeling that she is managing some better in terms of comforting herself and she also pointed out again today that the times of increased struggle do not seem to last as long as they did in earlier days closer to the time of husband's death.  Seems to have more hope even in spite of continued grief and also seems to understand that this is all part of the process in her healing.  Is very appreciative of her time to work in therapy and also of the family and friends that stand behind her with their support.  Goal review and progress/challenges noted with patient.  Next appointment within 2  weeks.   Kelleen Patee, LCSW

## 2024-01-21 ENCOUNTER — Other Ambulatory Visit: Payer: Self-pay

## 2024-01-21 ENCOUNTER — Other Ambulatory Visit (HOSPITAL_BASED_OUTPATIENT_CLINIC_OR_DEPARTMENT_OTHER): Payer: Self-pay

## 2024-01-21 DIAGNOSIS — F411 Generalized anxiety disorder: Secondary | ICD-10-CM

## 2024-01-21 MED ORDER — BUSPIRONE HCL 10 MG PO TABS
10.0000 mg | ORAL_TABLET | Freq: Two times a day (BID) | ORAL | 3 refills | Status: DC
Start: 2024-01-21 — End: 2024-01-21
  Filled 2024-01-21: qty 60, 30d supply, fill #0

## 2024-01-21 MED ORDER — BUSPIRONE HCL 5 MG PO TABS
ORAL_TABLET | ORAL | 3 refills | Status: DC
  Filled 2024-01-21: qty 60, fill #0

## 2024-01-21 MED ORDER — BUSPIRONE HCL 10 MG PO TABS
10.0000 mg | ORAL_TABLET | Freq: Two times a day (BID) | ORAL | 3 refills | Status: DC
Start: 2024-01-21 — End: 2024-05-16
  Filled 2024-01-21: qty 60, 30d supply, fill #0
  Filled 2024-02-25: qty 60, 30d supply, fill #1
  Filled 2024-03-27: qty 60, 30d supply, fill #2
  Filled 2024-04-26: qty 60, 30d supply, fill #3

## 2024-01-31 ENCOUNTER — Other Ambulatory Visit (HOSPITAL_BASED_OUTPATIENT_CLINIC_OR_DEPARTMENT_OTHER): Payer: Self-pay

## 2024-02-01 ENCOUNTER — Ambulatory Visit: Admitting: Psychiatry

## 2024-02-01 DIAGNOSIS — F4323 Adjustment disorder with mixed anxiety and depressed mood: Secondary | ICD-10-CM | POA: Diagnosis not present

## 2024-02-01 NOTE — Progress Notes (Signed)
 [facility] Counselor/Therapist Progress Note  Patient ID: Kathryn Cummings, MRN: [id number],    Date: 02/01/2024  Time Spent: 53 minutes   Treatment Type: Individual Therapy  Reported Symptoms:  anxiety, grief: husband's death after bone marrow transplant that wasn't successful, sadness   Mental Status Exam:  Appearance:   Well Groomed     Behavior:  Appropriate, Sharing, and Motivated  Motor:  Normal  Speech/Language:   Clear and Coherent  Affect:  Anxious, some sadness, depression  Mood:  anxious, depressed, and sad  Thought process:  goal directed  Thought content:    WNL  Sensory/Perceptual disturbances:    WNL  Orientation:  oriented to person, place, time/date, situation, day of week, month of year, year, and stated date of February 01, 2024  Attention:  Good  Concentration:  Good  Memory:  WNL  Fund of knowledge:   Good  Insight:    Good  Judgment:   Good  Impulse Control:  Good   Risk Assessment: Danger to Self:  No Self-injurious Behavior: No Danger to Others: No Duty to Warn:no Physical Aggression / Violence:No  Access to Firearms a concern: No  Gang Involvement:No   Subjective:  Patient in session today and reports and feels my emotions are some better and more stable, not so much panic.  Did have tough time returning home after vacation with other family members. Noticeable difference in not having her husband there, and had some passing flashes of grief which she is trying to manage better. Talking through some of her improvement that she is noticing, but also still having some struggles. Hoping to keep moving forward in a more hopeful way. Grief is still challenging at times understandably.  Is starting to see some of her strengths and also noticing that her compartmentalizing my grief is helpful in some ways and discussed this further in session today.  She is very grateful for having a wonderful and supportive family and friends that are definitely by her  side during this difficult time.  More focused today on her grief being a process and how she needs to remember this and not expect certain changes too fast.  When looking back, she is able to definitely pinpoint certain progress that she has made since husband's death.  Learning to give herself a break when she has a day where there is a little more struggle and also more willing to readily reach out to friends and family that have insisted that she feel comfortable reaching out to them whenever they can be of support.  Does not feel that she is drifting as much as she was a few weeks ago.  Encouraging patient to see her strength and also allowing herself to just be herself and that she does not have to worry about being a certain way for other people.  Worked more with her today on some of the previously identified layers of grief which seemed helpful to her.  Encouraged her not to compare herself to others and also not to be second-guessing herself.  Admits that at times she feels more normal.  Patient is continuing to use journaling to help manage some of her grief and frequently shares some of her writing in therapy sessions which she feels is feeding some of the hope that she is starting to feel.  She remains very goal-directed and is able to clearly state some ways that she has progressed even though she acknowledges that she does have a way  to go   Interventions: Cognitive Behavioral Therapy, Solution-Oriented/Positive Psychology, and Ego-Supportive Identify life losses and/or situations including from the past and in the present, that support current symptomology of anxiety/depression. 2.   Develop behavioral and cognitive strategies to reduce and work through excessive sadness, anxiety or depression.  Identify, challenge, and replace negative self talk with more positive, realistic, and empowering self talk. 3.    Patient will work on developing reality based, positive cognitive messages  that can help improve her mood and outlook while also working on building self-confidence in her ability to work through significant loss    Diagnosis:   ICD-10-CM   1. Situational mixed anxiety and depressive disorder  F43.23      Plan:  Patient showing good motivation and actively working and communicating in session today focusing further on her grief regarding the loss of her husband and also the grief regarding the loss of her sister-in-law with whom patient has been very close and feels that she is carrying some unresolved grief and both of these losses.  Definitely making progress as noted above.  Motivation continues to be quite good, doing well in her communication with family and friends, and making improvement in how she views and treats herself especially during this difficult time in her life.  Has more hope and speaks of this openly.  Works well in therapy and is very Adult nurse.  Noticeably more motivation  Goal review and progress/challenges noted with patient.  Next appointment within 2 weeks.   Kelleen Patee, LCSW

## 2024-02-05 ENCOUNTER — Telehealth: Admitting: Physician Assistant

## 2024-02-05 DIAGNOSIS — R3989 Other symptoms and signs involving the genitourinary system: Secondary | ICD-10-CM

## 2024-02-05 NOTE — Progress Notes (Signed)
  Because of belly pain, I feel your condition warrants further evaluation and I recommend that you be seen in a face-to-face visit.   NOTE: There will be NO CHARGE for this E-Visit   If you are having a true medical emergency, please call 911.     For an urgent face to face visit, [place] has multiple urgent care centers for your convenience.  Click the link below for the full list of locations and hours, walk-in wait times, appointment scheduling options and driving directions:  Urgent Care - [place], [place], [place], [place], [place], [place]  [place]     Your MyChart E-visit questionnaire answers were reviewed by a board certified advanced clinical practitioner to complete your personal care plan based on your specific symptoms.    Thank you for using e-Visits.

## 2024-02-07 ENCOUNTER — Other Ambulatory Visit (HOSPITAL_BASED_OUTPATIENT_CLINIC_OR_DEPARTMENT_OTHER): Payer: Self-pay

## 2024-02-07 ENCOUNTER — Ambulatory Visit (INDEPENDENT_AMBULATORY_CARE_PROVIDER_SITE_OTHER): Admitting: Family Medicine

## 2024-02-07 ENCOUNTER — Encounter: Payer: Self-pay | Admitting: Family Medicine

## 2024-02-07 VITALS — BP 112/74 | HR 70 | Temp 98.1°F | Ht 63.0 in | Wt 161.0 lb

## 2024-02-07 DIAGNOSIS — E782 Mixed hyperlipidemia: Secondary | ICD-10-CM | POA: Diagnosis not present

## 2024-02-07 DIAGNOSIS — K219 Gastro-esophageal reflux disease without esophagitis: Secondary | ICD-10-CM

## 2024-02-07 DIAGNOSIS — G4700 Insomnia, unspecified: Secondary | ICD-10-CM

## 2024-02-07 DIAGNOSIS — R5383 Other fatigue: Secondary | ICD-10-CM | POA: Diagnosis not present

## 2024-02-07 DIAGNOSIS — F411 Generalized anxiety disorder: Secondary | ICD-10-CM | POA: Diagnosis not present

## 2024-02-07 DIAGNOSIS — E559 Vitamin D deficiency, unspecified: Secondary | ICD-10-CM | POA: Diagnosis not present

## 2024-02-07 DIAGNOSIS — R1030 Lower abdominal pain, unspecified: Secondary | ICD-10-CM

## 2024-02-07 DIAGNOSIS — R7303 Prediabetes: Secondary | ICD-10-CM

## 2024-02-07 DIAGNOSIS — R8290 Unspecified abnormal findings in urine: Secondary | ICD-10-CM

## 2024-02-07 LAB — LIPID PANEL
Cholesterol: 147 mg/dL (ref 0–200)
HDL: 70.4 mg/dL (ref >39.00)
LDL Cholesterol: 57 mg/dL (ref 0–99)
NonHDL: 77
Total CHOL/HDL Ratio: 2
Triglycerides: 100 mg/dL (ref 0.0–149.0)
VLDL: 20 mg/dL (ref 0.0–40.0)

## 2024-02-07 LAB — CBC WITH DIFFERENTIAL/PLATELET
Basophils Absolute: 0 10*3/uL (ref 0.0–0.1)
Basophils Relative: 0.4 % (ref 0.0–3.0)
Eosinophils Absolute: 0.2 10*3/uL (ref 0.0–0.7)
Eosinophils Relative: 2 % (ref 0.0–5.0)
HCT: 40.7 % (ref 36.0–46.0)
Hemoglobin: 13.6 g/dL (ref 12.0–15.0)
Lymphocytes Relative: 26 % (ref 12.0–46.0)
Lymphs Abs: 2 10*3/uL (ref 0.7–4.0)
MCHC: 33.4 g/dL (ref 30.0–36.0)
MCV: 83.5 fl (ref 78.0–100.0)
Monocytes Absolute: 0.5 10*3/uL (ref 0.1–1.0)
Monocytes Relative: 6.8 % (ref 3.0–12.0)
Neutro Abs: 4.9 10*3/uL (ref 1.4–7.7)
Neutrophils Relative %: 64.8 % (ref 43.0–77.0)
Platelets: 341 10*3/uL (ref 150.0–400.0)
RBC: 4.87 Mil/uL (ref 3.87–5.11)
RDW: 13.4 % (ref 11.5–15.5)
WBC: 7.6 10*3/uL (ref 4.0–10.5)

## 2024-02-07 LAB — COMPREHENSIVE METABOLIC PANEL WITH GFR
ALT: 33 U/L (ref 0–35)
AST: 28 U/L (ref 0–37)
Albumin: 4.2 g/dL (ref 3.5–5.2)
Alkaline Phosphatase: 80 U/L (ref 39–117)
BUN: 14 mg/dL (ref 6–23)
CO2: 26 meq/L (ref 19–32)
Calcium: 10.1 mg/dL (ref 8.4–10.5)
Chloride: 102 meq/L (ref 96–112)
Creatinine, Ser: 1 mg/dL (ref 0.40–1.20)
GFR: 58.18 mL/min — ABNORMAL LOW (ref >60.00)
Glucose, Bld: 107 mg/dL — ABNORMAL HIGH (ref 70–99)
Potassium: 3.8 meq/L (ref 3.5–5.1)
Sodium: 138 meq/L (ref 135–145)
Total Bilirubin: 0.6 mg/dL (ref 0.2–1.2)
Total Protein: 7.7 g/dL (ref 6.0–8.3)

## 2024-02-07 LAB — POCT URINALYSIS DIPSTICK
Bilirubin, UA: NEGATIVE
Blood, UA: NEGATIVE
Glucose, UA: NEGATIVE
Ketones, UA: NEGATIVE
Nitrite, UA: NEGATIVE
Protein, UA: NEGATIVE
Spec Grav, UA: 1.025 (ref 1.010–1.025)
Urobilinogen, UA: 0.2 U/dL
pH, UA: 6 (ref 5.0–8.0)

## 2024-02-07 LAB — TSH: TSH: 1.4 u[IU]/mL (ref 0.35–5.50)

## 2024-02-07 LAB — VITAMIN D 25 HYDROXY (VIT D DEFICIENCY, FRACTURES): VITD: 33.58 ng/mL (ref 30.00–100.00)

## 2024-02-07 LAB — VITAMIN B12: Vitamin B-12: 274 pg/mL (ref 211–911)

## 2024-02-07 LAB — HEMOGLOBIN A1C: Hgb A1c MFr Bld: 6.7 % — ABNORMAL HIGH (ref 4.6–6.5)

## 2024-02-07 MED ORDER — OMEPRAZOLE 40 MG PO CPDR
40.0000 mg | DELAYED_RELEASE_CAPSULE | Freq: Every day | ORAL | 0 refills | Status: DC
  Filled 2024-02-07: qty 90, 90d supply, fill #0

## 2024-02-07 MED ORDER — OMEPRAZOLE 40 MG PO CPDR
40.0000 mg | DELAYED_RELEASE_CAPSULE | Freq: Every day | ORAL | 2 refills | Status: DC
  Filled 2024-02-07: qty 90, 90d supply, fill #0
  Filled 2024-04-26: qty 90, 90d supply, fill #1

## 2024-02-07 MED ORDER — CEPHALEXIN 500 MG PO CAPS
500.0000 mg | ORAL_CAPSULE | Freq: Three times a day (TID) | ORAL | 0 refills | Status: AC
  Filled 2024-02-07: qty 21, 7d supply, fill #0

## 2024-02-07 NOTE — Assessment & Plan Note (Signed)
 Stable. Continue Buspar  10mg  BID.

## 2024-02-07 NOTE — Assessment & Plan Note (Addendum)
 Currently, managed by cardiology. However, she is due to have lipids and liver function checked for cardiology. Will obtain labs and send to cardiology. She is fasting today.

## 2024-02-07 NOTE — Assessment & Plan Note (Signed)
 Not taking any medication. Ordered A1c.

## 2024-02-07 NOTE — Assessment & Plan Note (Signed)
 Continue with Omeprazole  40mg  daily. It is effective. Refilled medication.

## 2024-02-07 NOTE — Patient Instructions (Addendum)
-  It was so good to see you today and I am so proud of you.  -Urinalysis showed some leukocytes (white blood cells) in urine. Will send urine for culture. Results will be available in MyChart and will call with result. In the meantime, since having symptoms, prescribed Keflex 500mg  tablet, take 1 tablet every 8 hours for 7 days.  -Ordered labs. Office will call with results and be available on MyChart. Will send a copy of labs to cardiology.  -Refilled Omeprazole .  -Discussed about at least trying Alprazolam  0.5mg  every other night to help with insomnia.  -Continue all other medications.  -Follow up in 3 months for chronic management.

## 2024-02-07 NOTE — Progress Notes (Signed)
 Established Patient Office Visit   Subjective:  Patient ID: Kathryn Cummings, female    DOB: 05-24-1956  Age: 68 y.o. MRN: [id number]  Chief Complaint  Patient presents with   Medical Management of Chronic Issues    1 month follow up Lower abd pain.    HPI Anxiety: Patient is currently taking Buspar  10mg  BID. She reports this is effective. Also, takes Alprazolam  0.5mg  tablet at night PRN for insomnia since it is hard for her to stay sleep or wakes up frequently. She reports she has not been taking it frequently because she is concerned about addition. She reports she wants to do more during the day, but physical is tired. Patient lost her spouse on 09/16/2023. She is in counseling with Barnie Bunde with [facility]. This has been beneficial too.   On Friday evening started having mid lower abd pain that felt like bladder spasms. Pain is intermittent, usually lasting hours. Pain described as pressure, sharp. Had chills on Saturday. Denies increase in urgency, frequency, or blood in urine.   GERD: Chronic. Patient is taking Omeprazole  40mg  daily. Effective. Needs refill.  ROS See HPI above     Objective:   BP 112/74   Pulse 70   Temp 98.1 F (36.7 C) (Oral)   Ht 5' 3 (1.6 m)   Wt 161 lb (73 kg)   SpO2 97%   BMI 28.52 kg/m    Physical Exam Vitals reviewed.  Constitutional:      General: She is not in acute distress.    Appearance: Normal appearance. She is overweight. She is not ill-appearing, toxic-appearing or diaphoretic.  HENT:     Head: Normocephalic and atraumatic.   Eyes:     General:        Right eye: No discharge.        Left eye: No discharge.     Conjunctiva/sclera: Conjunctivae normal.    Cardiovascular:     Rate and Rhythm: Normal rate and regular rhythm.     Heart sounds: Normal heart sounds. No murmur heard.    No friction rub. No gallop.  Pulmonary:     Effort: Pulmonary effort is normal. No respiratory distress.     Breath sounds:  Normal breath sounds.  Abdominal:     General: There is no distension.     Palpations: Abdomen is soft. There is no mass.     Tenderness: There is no abdominal tenderness. There is no right CVA tenderness.   Musculoskeletal:        General: Normal range of motion.   Skin:    General: Skin is warm and dry.   Neurological:     General: No focal deficit present.     Mental Status: She is alert and oriented to person, place, and time. Mental status is at baseline.     Gait: Gait normal.   Psychiatric:        Mood and Affect: Mood normal. Affect is tearful.        Behavior: Behavior normal.        Thought Content: Thought content normal.        Judgment: Judgment normal.     Latest Reference Range & Units 02/07/24 11:14  Bilirubin, UA  negative  Clarity, UA  clear  Color, UA  yellow  Glucose Negative  Negative  Ketones, UA  negative  Leukocytes,UA Negative  Large (3+) !  Nitrite, UA  negative  pH, UA 5.0 - 8.0  6.0  Protein,UA Negative  Negative  Specific Gravity, UA 1.010 - 1.025  1.025  Urobilinogen, UA 0.2 or 1.0 E.U./dL 0.2  RBC, UA  negative  !: Data is abnormal     Assessment & Plan:  Lower abdominal pain -     POCT urinalysis dipstick -     Urine Culture; Future  Fatigue, unspecified type -     CBC with Differential/Platelet -     Comprehensive metabolic panel with GFR -     VITAMIN D 25 Hydroxy (Vit-D Deficiency, Fractures) -     Vitamin B12 -     TSH  Generalized anxiety disorder Assessment & Plan: Stable. Continue Buspar  10mg  BID.    Mixed hyperlipidemia Assessment & Plan: Currently, managed by cardiology. However, she is due to have lipids and liver function checked for cardiology. Will obtain labs and send to cardiology. She is fasting today.    Orders: -     Comprehensive metabolic panel with GFR -     Lipid panel  Prediabetes Assessment & Plan: Not taking any medication. Ordered A1c.   Orders: -     Comprehensive metabolic panel with  GFR -     Hemoglobin A1c  Vitamin D deficiency -     VITAMIN D 25 Hydroxy (Vit-D Deficiency, Fractures)  Abnormal urinalysis -     Cephalexin; Take 1 capsule (500 mg total) by mouth 3 (three) times daily for 7 days.  Dispense: 21 capsule; Refill: 0  Gastroesophageal reflux disease without esophagitis Assessment & Plan: Continue with Omeprazole  40mg  daily. It is effective. Refilled medication.   Orders: -     Omeprazole ; Take 1 capsule (40 mg total) by mouth daily.  Dispense: 90 capsule; Refill: 2  Insomnia, unspecified type Assessment & Plan: Uncontrolled. Recommend to try taking Alprazolam  0.50mg  tablet at least every other night to see if this will help with sleep. Insomnia is affecting her not being able to do as much during the day from being tired. Will need to obtain UDS and controlled substance agreement at next appointment.    -Ordered labs (CBC, CMP, Vitamin B12/D, and TSH) based on fatigue and history of vitamin D.  -Urinalysis showed some leukocytes (white blood cells) in urine. Will send urine for culture. Results will be available in MyChart and will call with result. In the meantime, since having symptoms, prescribed Keflex 500mg  tablet, take 1 tablet every 8 hours for 7 days.   Return in about 3 months (around 05/09/2024) for chronic management.   Jeaninne Lodico, NP

## 2024-02-07 NOTE — Assessment & Plan Note (Signed)
 Uncontrolled. Recommend to try taking Alprazolam  0.50mg  tablet at least every other night to see if this will help with sleep. Insomnia is affecting her not being able to do as much during the day from being tired. Will need to obtain UDS and controlled substance agreement at next appointment.

## 2024-02-08 ENCOUNTER — Ambulatory Visit: Admitting: Family Medicine

## 2024-02-08 ENCOUNTER — Ambulatory Visit: Payer: Self-pay | Admitting: Family Medicine

## 2024-02-08 LAB — URINE CULTURE
MICRO NUMBER:: 16612426
SPECIMEN QUALITY:: ADEQUATE

## 2024-02-09 ENCOUNTER — Encounter: Payer: Self-pay | Admitting: Family Medicine

## 2024-02-22 ENCOUNTER — Ambulatory Visit: Admitting: Psychiatry

## 2024-02-22 DIAGNOSIS — F4323 Adjustment disorder with mixed anxiety and depressed mood: Secondary | ICD-10-CM

## 2024-02-22 NOTE — Progress Notes (Signed)
 [facility] Counselor/Therapist Progress Note  Patient ID: Kathryn Cummings, MRN: [id number],    Date: 02/22/2024  Time Spent: 55 minutes   Treatment Type: Individual Therapy  Reported Symptoms: anxiety, grief: husband's death after bone marrow transplant that was unsuccessful, sadness    Mental Status Exam:  Appearance:   Casual and Well Groomed     Behavior:  Appropriate and Motivated  Motor:  Normal  Speech/Language:   Clear and Coherent  Affect:  Tearful  Mood:  anxious  Thought process:  goal directed  Thought content:    WNL  Sensory/Perceptual disturbances:    WNL  Orientation:  oriented to person, place, time/date, situation, Cummings of week, month of year, year, and stated date of February 22, 2024  Attention:  Good  Concentration:  Good  Memory:  WNL  Fund of knowledge:   Good  Insight:    Good  Judgment:   Good  Impulse Control:  Good   Risk Assessment: Danger to Self:  No Self-injurious Behavior: No Danger to Others: No Duty to Warn:no Physical Aggression / Violence:No  Access to Firearms a concern: No  Gang Involvement:No   Subjective:   Patient in for session today and reporting she had a good family vacation to beach last week, but also having sadness due to loss of husband and he not being there this year to go with them to the beach. Expressing sadness over this and the stress/sadness re: family dog concerns and talked through this more in session today. I do think I'm better but know  that I have a ways to go in progressing.  Continues today working through her grief re: husband's death and how it has impacted her in some expected ad unexpected ways. Does note some progress on her part. Denies any SI. Working with her treatment goals and is noticing improvement. Learning to have more self-compassion and not judge herself nor compare herself with others who might also be grieving.  Has good support system. Doesn't feel she is drifting  as much and remains very  motivated.  Encouraged patient as she does see some of her strength.  Continues to work on layers of grief and making progress.  Still encouraging her not to compare herself to others in similar situations, and let her journey be her journey.  Patient remains goal-directed, seeing her progress and also that she still has a way to go but feeling more hopeful.    Interventions: Cognitive Behavioral Therapy, Solution-Oriented/Positive Psychology, and Ego-Supportive Identify life losses and/or situations including from the past and in the present, that support current symptomology of anxiety/depression. 2.   Develop behavioral and cognitive strategies to reduce and work through excessive sadness, anxiety or depression.  Identify, challenge, and replace negative self talk with more positive, realistic, and empowering self talk. 3.    Patient will work on developing reality based, positive cognitive messages that can help improve her mood and outlook while also working on building self-confidence in her ability to work through significant loss    Diagnosis:   ICD-10-CM   1. Situational mixed anxiety and depressive disorder  F43.23      Plan:  Patient today continuing the work on her grief re: death of husband and is noticing progress, which is helping her motivation. Works consistently in therapy. Communicating well with family and friends who are supportive and understanding. Speaking more openly and more often of having some peace that she feels will grow as she continues  her process of healing. Showing good efforts in therapy.    Goal review and progress/challenges noted with patient.  Next appt within 2 weeks.   Barnie Bunde, LCSW

## 2024-03-07 ENCOUNTER — Ambulatory Visit (INDEPENDENT_AMBULATORY_CARE_PROVIDER_SITE_OTHER): Admitting: Psychiatry

## 2024-03-07 DIAGNOSIS — F4323 Adjustment disorder with mixed anxiety and depressed mood: Secondary | ICD-10-CM

## 2024-03-07 NOTE — Progress Notes (Signed)
 [facility] Counselor/Therapist Progress Note  Patient ID: Kathryn Cummings, MRN: [id number],    Date: 03/07/2024  Time Spent: 55 minutes   Treatment Type: Individual Therapy  Reported Symptoms: anxiety, grief re: husband's death after bone marrow transplant that was unsuccessful, overthinking, sadness    Mental Status Exam:  Appearance:   Casual and Neat     Behavior:  Appropriate, Sharing, and Motivated  Motor:  Normal  Speech/Language:   Clear and Coherent  Affect:  Depressed and anxiety  Mood:  anxious and depressed  Thought process:  goal directed  Thought content:    WNL  Sensory/Perceptual disturbances:    WNL  Orientation:  oriented to person, place, time/date, situation, day of week, month of year, year, and stated date of March 07, 2024  Attention:  Good  Concentration:  Good  Memory:  WNL  Fund of knowledge:   Good  Insight:    Good  Judgment:   Good  Impulse Control:  Good   Risk Assessment: Danger to Self:  No Self-injurious Behavior: No Danger to Others: No Duty to Warn:no Physical Aggression / Violence:No  Access to Firearms a concern: No  Gang Involvement:No   Subjective: Patient today working in session further on her grief and loss, some reliving of husband's last days and eventual death. Working on her guilt more intentionally, and sharing more of the issues that she is experiencing with her grief. Helping other family members and working on her own grief. Doubting herself at times but  also seeing some progress.  Reflecting some on a recent vacation the family took to the beach and how it was a good trip but she also has some significant sadness and feelings of loss during that time understandably.  Processed a lot of this in session today which seemed to be helpful to her.  Stated I do feel on progressing but also no that I still have more to work on.  Continues that work today in session and able to feel more self compassion and less judgmental of  herself, without comparing herself to others who also might be grieving.  Has a really good support system with friends and family.  States that she is not feeling that drifting feeling is much with her sadness.  Is able to see some of her strengths.  She continues to work on several layers of grief and is making progress which she herself is noticing some more.  Encouraged not to compare herself to others in similar situations and just allow her journey to be her journey.  She definitely remains goal-directed, motivated, and works consistently in sessions.  Interventions: Cognitive Behavioral Therapy, Solution-Oriented/Positive Psychology, and Ego-Supportive Identify life losses and/or situations including from the past and in the present, that support current symptomology of anxiety/depression. 2.   Develop behavioral and cognitive strategies to reduce and work through excessive sadness, anxiety or depression.  Identify, challenge, and replace negative self talk with more positive, realistic, and empowering self talk. 3.    Patient will work on developing reality based, positive cognitive messages that can help improve her mood and outlook while also working on building self-confidence in her ability to work through significant loss   Diagnosis:   ICD-10-CM   1. Situational mixed anxiety and depressive disorder  F43.23      Plan: Patient in session today continuing to do some good work on her symptoms of grief and loss, sadness, and trying to find her way through this  time healing through her grief after the loss of her husband 5 months ago.  Is having some better days than others, sometimes when the grief is acute and sometimes when it it feels more manageable.  Feels that she is learning a lot in this process and has become more patient with herself.  Still overthinking some but it has decreased a little bit.  Motivation good.  Remains in contact with supportive family and friends and starting  to feel a little more peace at times.  Goal review and progress/challenges noted with patient.  Next appointment within 2 weeks.   Barnie Bunde, LCSW

## 2024-03-20 ENCOUNTER — Ambulatory Visit: Admitting: Psychiatry

## 2024-03-30 ENCOUNTER — Ambulatory Visit (INDEPENDENT_AMBULATORY_CARE_PROVIDER_SITE_OTHER): Admitting: Psychiatry

## 2024-03-30 DIAGNOSIS — F4323 Adjustment disorder with mixed anxiety and depressed mood: Secondary | ICD-10-CM

## 2024-03-30 NOTE — Progress Notes (Signed)
 [facility] Counselor/Therapist Progress Note  Patient ID: Kathryn Cummings, MRN: [id number],    Date: 03/30/2024  Time Spent: 55 minutes   Treatment Type: Individual Therapy  Reported Symptoms: anxiety, grief re: husband's death after bone marrow transplant that was unsuccessful, overthinking, sadness, but also showing some improvement   Mental Status Exam:  Appearance:   Casual and Neat     Behavior:  Appropriate, Sharing, and Motivated  Motor:  Normal  Speech/Language:   Clear and Coherent  Affect:  Depressed and anxious  Mood:  anxious and depressed  Thought process:  normal  Thought content:    WNL  Sensory/Perceptual disturbances:    WNL  Orientation:  oriented to person, place, time/date, situation, day of week, month of year, year, and stated date of Aug. 14, 2025  Attention:  Good  Concentration:  Good  Memory:  WNL  Fund of knowledge:   Good  Insight:    Good  Judgment:   Good  Impulse Control:  Good   Risk Assessment: Danger to Self:  No Self-injurious Behavior: No Danger to Others: No Duty to Warn:no Physical Aggression / Violence:No  Access to Firearms a concern: No  Gang Involvement:No   Subjective:    Patient today in session focusing more on her grief and loss, reliving some of her husband's last days and his eventual death, and her efforts and trying to live more in the present and move forward. Recently went on vacation with a son's family and that went well but patient did think about husband several times and missing him. Is showing a lot of effort and working on her unresolved grief issues and today needed the session to focus on those issues.  Healthier insight.  Less guilt.  Some intermittent tearfulness.  Does question herself at times.  Self-doubt is decreasing some as she sees some progress she is making.  Definitely feeling supported by friends and family.  Is not feeling as much of the drifting feelings that she had earlier.  Having a good  support system with family and friends has certainly proven to be helpful.  Trying not to compare herself to others in similar situations and instead just letting her journey be her journey as she keeps moving forward.  Good motivation in session today.    Interventions: Cognitive Behavioral Therapy, Solution-Oriented/Positive Psychology, and Ego-Supportive Identify life losses and/or situations including from the past and in the present, that support current symptomology of anxiety/depression. 2.   Develop behavioral and cognitive strategies to reduce and work through excessive sadness, anxiety or depression.  Identify, challenge, and replace negative self talk with more positive, realistic, and empowering self talk. 3.    Patient will work on developing reality based, positive cognitive messages that can help improve her mood and outlook while also working on building self-confidence in her ability to work through significant loss  Diagnosis:   ICD-10-CM   1. Situational mixed anxiety and depressive disorder  F43.23      Plan:    Patient today in session working further on her symptoms of grief and loss, sadness, and trying to find her way through this time of grief and healing following the loss of her husband 6 months ago.  Is having more better days at times but also still having some difficult days with her grief and sadness.  She does notice improvement and that is encouraging for her as she feels some things are getting more manageable for her.  Some  overthinking and states that she still has a feeling that she is learning a lot through all of this process of loss.  Today her motivation is good.  Fortunately she remains in contact with very supportive family members and friends which is also supporting her progress.  Feeling a little more peace and hopefulness.  Goal review and progress/challenges noted with patient.  Next appointment within 2 weeks.   Barnie Bunde,  LCSW

## 2024-04-20 ENCOUNTER — Ambulatory Visit: Admitting: Psychiatry

## 2024-04-20 DIAGNOSIS — F4323 Adjustment disorder with mixed anxiety and depressed mood: Secondary | ICD-10-CM

## 2024-04-20 NOTE — Progress Notes (Signed)
 [facility] Counselor/Therapist Progress Note  Patient ID: Kathryn Cummings, MRN: [id number],    Date: 04/20/2024  Time Spent: 53 minutes   Treatment Type: Individual Therapy   Reported Symptoms: anxiety, grief over husband's death,  some overthinking, sadness   Mental Status Exam:  Appearance:   Casual and Neat     Behavior:  Appropriate, Sharing, and Motivated  Motor:  Normal  Speech/Language:   Clear and Coherent  Affect:  Depressed and anxiety  Mood:  anxious and depressed  Thought process:  goal directed  Thought content:    WNL  Sensory/Perceptual disturbances:    WNL  Orientation:  oriented to person, place, time/date, situation, day of week, month of year, year, and stated date of Sept. 4, 2025  Attention:  Good  Concentration:  Good  Memory:  WNL  Fund of knowledge:   Good  Insight:    Good  Judgment:   Good  Impulse Control:  Good   Risk Assessment: Danger to Self:  No Self-injurious Behavior: No Danger to Others: No Duty to Warn:no Physical Aggression / Violence:No  Access to Firearms a concern: No  Gang Involvement:No   Subjective:   Patient today working well in session on her grief, loss, reliving some of husband's last days and last days, but also talking through other parts of her grief. Not getting stuck in my grief but having occasional crying. Is getting stronger, and better able to work through some of her grief and also participate a little more actively in activities with friends and family.  Taking each week as it comes in finding that she is scheduling herself for more activities with more people which is encouraging, and still at times needing some alone time.  Doing really well and talking through her grief and loss today and seems to be gaining more understanding into this whole process for herself and believing in herself more, even with some occasional self-doubt.  Planning more ahead of time to be with other people and other family members.   Missing husband a lot but also normalizing her grief.  Intermittent tearfulness and questioning herself at times.  Other times feeling more self-assured.  Has lots of good support from family and friends and neighbors.  Less self-doubt on most days.  Trying to refrain from comparing herself to others in similar situations and instead accepting her own journey.   Interventions: Cognitive Behavioral Therapy, Solution-Oriented/Positive Psychology, and Ego-Supportive Identify life losses and/or situations including from the past and in the present, that support current symptomology of anxiety/depression. 2.   Develop behavioral and cognitive strategies to reduce and work through excessive sadness, anxiety or depression.  Identify, challenge, and replace negative self talk with more positive, realistic, and empowering self talk. 3.    Patient will work on developing reality based, positive cognitive messages that can help improve her mood and outlook while also working on building self-confidence in her ability to work through significant loss   Diagnosis:   ICD-10-CM   1. Situational mixed anxiety and depressive disorder  F43.23      Plan: Patient working well in session today focusing more on certain parts of her grief, loss, and sadness.  Gaining some more confidence in her being able to manage her grief and heal gradually after the death of her husband 7 months ago.  Overall noticing that she is having more better days than difficult days, but the difficult days can be hard but do not last as  long.  Noticing her own improvement and this feels encouraging to her.  Catching herself and some overthinking and working to let go of that.  Motivation good.  Reports that she is staying in good contact with supportive family members and friends, all of which contributes to her feeling more hope for the future and more peace in the present.  Goal review and progress/challenges noted with patient.  Next appt  within 2 weeks.   Barnie Bunde, LCSW

## 2024-04-26 ENCOUNTER — Other Ambulatory Visit: Payer: Self-pay | Admitting: Cardiology

## 2024-04-26 DIAGNOSIS — E782 Mixed hyperlipidemia: Secondary | ICD-10-CM

## 2024-04-27 ENCOUNTER — Other Ambulatory Visit (HOSPITAL_BASED_OUTPATIENT_CLINIC_OR_DEPARTMENT_OTHER): Payer: Self-pay

## 2024-04-27 ENCOUNTER — Other Ambulatory Visit: Payer: Self-pay

## 2024-04-27 MED ORDER — ATORVASTATIN CALCIUM 10 MG PO TABS
10.0000 mg | ORAL_TABLET | Freq: Every day | ORAL | 0 refills | Status: DC
Start: 2024-04-27 — End: 2024-05-16
  Filled 2024-04-27: qty 90, 90d supply, fill #0

## 2024-04-29 ENCOUNTER — Other Ambulatory Visit (HOSPITAL_BASED_OUTPATIENT_CLINIC_OR_DEPARTMENT_OTHER): Payer: Self-pay

## 2024-05-15 ENCOUNTER — Encounter: Payer: Self-pay | Admitting: Family Medicine

## 2024-05-15 ENCOUNTER — Ambulatory Visit: Payer: Self-pay | Admitting: Family Medicine

## 2024-05-15 ENCOUNTER — Ambulatory Visit (INDEPENDENT_AMBULATORY_CARE_PROVIDER_SITE_OTHER): Admitting: Family Medicine

## 2024-05-15 VITALS — BP 128/84 | HR 65 | Temp 97.9°F | Ht 63.0 in | Wt 165.0 lb

## 2024-05-15 DIAGNOSIS — E559 Vitamin D deficiency, unspecified: Secondary | ICD-10-CM

## 2024-05-15 DIAGNOSIS — E119 Type 2 diabetes mellitus without complications: Secondary | ICD-10-CM

## 2024-05-15 DIAGNOSIS — K219 Gastro-esophageal reflux disease without esophagitis: Secondary | ICD-10-CM

## 2024-05-15 DIAGNOSIS — G4700 Insomnia, unspecified: Secondary | ICD-10-CM

## 2024-05-15 DIAGNOSIS — F411 Generalized anxiety disorder: Secondary | ICD-10-CM | POA: Diagnosis not present

## 2024-05-15 DIAGNOSIS — E782 Mixed hyperlipidemia: Secondary | ICD-10-CM | POA: Diagnosis not present

## 2024-05-15 LAB — LIPID PANEL
Cholesterol: 171 mg/dL (ref 0–200)
HDL: 82.3 mg/dL (ref >39.00)
LDL Cholesterol: 63 mg/dL (ref 0–99)
NonHDL: 88.44
Total CHOL/HDL Ratio: 2
Triglycerides: 129 mg/dL (ref 0.0–149.0)
VLDL: 25.8 mg/dL (ref 0.0–40.0)

## 2024-05-15 LAB — COMPREHENSIVE METABOLIC PANEL WITH GFR
ALT: 32 U/L (ref 0–35)
AST: 24 U/L (ref 0–37)
Albumin: 4.6 g/dL (ref 3.5–5.2)
Alkaline Phosphatase: 57 U/L (ref 39–117)
BUN: 16 mg/dL (ref 6–23)
CO2: 29 meq/L (ref 19–32)
Calcium: 10.2 mg/dL (ref 8.4–10.5)
Chloride: 103 meq/L (ref 96–112)
Creatinine, Ser: 1.03 mg/dL (ref 0.40–1.20)
GFR: 56.05 mL/min — ABNORMAL LOW (ref >60.00)
Glucose, Bld: 92 mg/dL (ref 70–99)
Potassium: 3.7 meq/L (ref 3.5–5.1)
Sodium: 138 meq/L (ref 135–145)
Total Bilirubin: 0.5 mg/dL (ref 0.2–1.2)
Total Protein: 7.4 g/dL (ref 6.0–8.3)

## 2024-05-15 LAB — HEMOGLOBIN A1C: Hgb A1c MFr Bld: 6.5 % (ref 4.6–6.5)

## 2024-05-15 LAB — MICROALBUMIN / CREATININE URINE RATIO
Creatinine,U: 142.5 mg/dL
Microalb Creat Ratio: 6.2 mg/g (ref 0.0–30.0)
Microalb, Ur: 0.9 mg/dL (ref 0.0–1.9)

## 2024-05-15 LAB — VITAMIN D 25 HYDROXY (VIT D DEFICIENCY, FRACTURES): VITD: 30.49 ng/mL (ref 30.00–100.00)

## 2024-05-15 NOTE — Assessment & Plan Note (Signed)
 Continue Atorvastatin  10mg  daily, Ezetimibe  10mg  daily, and Fenofibrate  134mcg daily, along with OCT Co-Q10 and Omega-3 fatty acid. Previously seen cardiology, but instructed to follow up PRN. Ordered lipids and CMP. Fasting.

## 2024-05-15 NOTE — Assessment & Plan Note (Signed)
Continue with Omeprazole  daily. It is effective.

## 2024-05-15 NOTE — Progress Notes (Signed)
 Established Patient Office Visit   Subjective:  Patient ID: Kathryn Cummings, female    DOB: 1956/02/04  Age: 68 y.o. MRN: [id number]  Chief Complaint  Patient presents with   Medical Management of Chronic Issues    HPI Anxiety: Patient is currently taking Buspar  10mg  BID. She reports this is effective. Also, takes Alprazolam  0.5mg  tablet at night PRN for insomnia since it is hard for her to stay sleep or wakes up frequently. She reports she has been taking it frequently, 3-4 times a sleep. She reports she does not always take a whole tablet. Patient lost her spouse on 09/16/2023. She is in counseling with Barnie Bunde with [facility]. This has been beneficial too. Doing monthly session.   GERD: She takes Omeprazole  40mg  daily. Effective. She reports if she misses the medication, she will have symptoms. She dose have a hiatal hernia.   Hyperlipidemia: Patient is taking Atorvastatin  10mg  daily, Ezetimibe  10mg  daily, and Fenofibrate  134mcg daily, along with OCT Co-Q10 and Omega-3 fatty acid. She last seen Josefa Beauvais, NP with [facility] on 05/25/2023. She was instructed to follow PRN with provider or Dr. Wendel.  Lab Results  Component Value Date   CHOL 147 02/07/2024   HDL 70.40 02/07/2024   LDLCALC 57 02/07/2024   LDLDIRECT 145.0 03/17/2023   TRIG 100.0 02/07/2024   CHOLHDL 2 02/07/2024    On last lab, A1c has increased from prediabetes to diabetic. She reports she is walking more, but still having trouble with eating as one person since the loss of her spouse earlier this year. Lab Results  Component Value Date   HGBA1C 6.7 (H) 02/07/2024    ROS See HPI above     Objective:   BP 128/84   Pulse 65   Temp 97.9 F (36.6 C) (Oral)   Ht 5' 3 (1.6 m)   Wt 165 lb (74.8 kg)   SpO2 96%   BMI 29.23 kg/m    Physical Exam Vitals reviewed.  Constitutional:      General: She is not in acute distress.    Appearance: Normal appearance. She is  overweight. She is not ill-appearing, toxic-appearing or diaphoretic.  HENT:     Head: Normocephalic and atraumatic.  Eyes:     General:        Right eye: No discharge.        Left eye: No discharge.     Conjunctiva/sclera: Conjunctivae normal.  Cardiovascular:     Rate and Rhythm: Normal rate and regular rhythm.     Heart sounds: Normal heart sounds. No murmur heard.    No friction rub. No gallop.  Pulmonary:     Effort: Pulmonary effort is normal. No respiratory distress.     Breath sounds: Normal breath sounds.  Musculoskeletal:        General: Normal range of motion.  Skin:    General: Skin is warm and dry.  Neurological:     General: No focal deficit present.     Mental Status: She is alert and oriented to person, place, and time. Mental status is at baseline.  Psychiatric:        Mood and Affect: Mood normal.        Behavior: Behavior normal.        Thought Content: Thought content normal.        Judgment: Judgment normal.     No results found for any visits on 05/15/24.  The 10-year ASCVD risk score (Arnett DK,  et al., 2019) is: 6.4%    Assessment & Plan:  Generalized anxiety disorder Assessment & Plan: Stable. Scored 2 on PHQ-9 and 3 on GAD-7. Continue taking Alprazolam  0.5mg  PRN and Buspar  10 BID. UDS ordered and renewed controlled substance contract.    Orders: -     DRUG MONITOR, PANEL 1, SCREEN, URINE  Gastroesophageal reflux disease without esophagitis Assessment & Plan: Continue with Omeprazole  40mg  daily. It is effective.    Type 2 diabetes mellitus without complication, without long-term current use of insulin (HCC) -     Hemoglobin A1c -     Microalbumin / creatinine urine ratio  Mixed hyperlipidemia Assessment & Plan: Continue Atorvastatin  10mg  daily, Ezetimibe  10mg  daily, and Fenofibrate  134mcg daily, along with OCT Co-Q10 and Omega-3 fatty acid. Previously seen cardiology, but instructed to follow up PRN. Ordered lipids and CMP. Fasting.    Orders: -     Comprehensive metabolic panel with GFR -     Lipid panel  Insomnia, unspecified type Assessment & Plan: Controlled with taking Alprazolam  0.25mg  at night PRN. She reports she is taking it 3-4 times a week.   Orders: -     DRUG MONITOR, PANEL 1, SCREEN, URINE  Vitamin D  deficiency -     VITAMIN D  25 Hydroxy (Vit-D Deficiency, Fractures)  1.Review health maintenance: -Covid booster: Declines  -Influenza vaccine: Declines  -Mammogram: March at [facility]  -AWV: 07/17/2024 scheduled.  -PNA vaccine: Declines  2.Ordered vitamin D  lab for previous vitamin D  deficiency. Ordered A1c and microalbumin/Creatine urine ratio for diabetes. Not taking any medications. Was working on lifestyle modifications.  Return in about 3 months (around 08/14/2024).   Danira Nylander, NP

## 2024-05-15 NOTE — Assessment & Plan Note (Signed)
 Stable. Scored 2 on PHQ-9 and 3 on GAD-7. Continue taking Alprazolam  0.5mg  PRN and Buspar  10 BID. UDS ordered and renewed controlled substance contract.

## 2024-05-15 NOTE — Assessment & Plan Note (Signed)
 Controlled with taking Alprazolam  0.25mg  at night PRN. She reports she is taking it 3-4 times a week.

## 2024-05-15 NOTE — Patient Instructions (Signed)
-  It was so good to see you. I hope you have a wonderful Thanksgiving and Christmas with your family. -Ordered labs and urine. Office will call with lab results. -Continue all medications.  -Follow up in 3 months or sooner if needed.

## 2024-05-16 ENCOUNTER — Other Ambulatory Visit (HOSPITAL_BASED_OUTPATIENT_CLINIC_OR_DEPARTMENT_OTHER): Payer: Self-pay

## 2024-05-16 ENCOUNTER — Other Ambulatory Visit: Payer: Self-pay | Admitting: Family Medicine

## 2024-05-16 ENCOUNTER — Other Ambulatory Visit: Payer: Self-pay

## 2024-05-16 DIAGNOSIS — E782 Mixed hyperlipidemia: Secondary | ICD-10-CM

## 2024-05-16 DIAGNOSIS — F411 Generalized anxiety disorder: Secondary | ICD-10-CM

## 2024-05-16 MED ORDER — ATORVASTATIN CALCIUM 10 MG PO TABS
10.0000 mg | ORAL_TABLET | Freq: Every day | ORAL | 3 refills | Status: AC
  Filled 2024-05-16 – 2024-07-26 (×2): qty 90, 90d supply, fill #0

## 2024-05-16 MED ORDER — FENOFIBRATE 134 MG PO CAPS
134.0000 mg | ORAL_CAPSULE | Freq: Every day | ORAL | 3 refills | Status: AC
  Filled 2024-05-16 – 2024-07-26 (×2): qty 90, 90d supply, fill #0

## 2024-05-16 MED ORDER — ALPRAZOLAM 0.5 MG PO TABS
0.5000 mg | ORAL_TABLET | Freq: Two times a day (BID) | ORAL | 0 refills | Status: DC | PRN
  Filled 2024-05-16 – 2024-05-28 (×2): qty 30, 15d supply, fill #0

## 2024-05-16 MED ORDER — BUSPIRONE HCL 10 MG PO TABS
10.0000 mg | ORAL_TABLET | Freq: Two times a day (BID) | ORAL | 3 refills | Status: DC
  Filled 2024-05-16 – 2024-05-28 (×3): qty 60, 30d supply, fill #0
  Filled 2024-07-26: qty 60, 30d supply, fill #1

## 2024-05-16 NOTE — Progress Notes (Signed)
 Refilling medication.  PDMP reviewed for Alprazolam . Last refill was on 01/21/2024. UDS is UTD and renewed controlled substance agreement at last visit.

## 2024-05-17 LAB — DRUG MONITOR, PANEL 1, SCREEN, URINE

## 2024-05-17 LAB — DM TEMPLATE

## 2024-05-18 ENCOUNTER — Ambulatory Visit: Admitting: Psychiatry

## 2024-05-18 DIAGNOSIS — F4323 Adjustment disorder with mixed anxiety and depressed mood: Secondary | ICD-10-CM | POA: Diagnosis not present

## 2024-05-18 NOTE — Progress Notes (Signed)
 [facility] Counselor/Therapist Progress Note  Patient ID: Kathryn Cummings, MRN: [id number],    Date: 05/18/2024  Time Spent: 53 minutes   Treatment Type: Individual Therapy  Reported Symptoms: anxiety,  some depression but some better; sadness and grief re: husband's death and his birthday on 06-13-2024  Mental Status Exam:  Appearance:   Casual and Neat     Behavior:  Appropriate, Sharing, and Motivated  Motor:  Normal  Speech/Language:   Clear and Coherent  Affect:  Depressed and anxiety  Mood:  anxious and depressed  Thought process:  goal directed  Thought content:    WNL  Sensory/Perceptual disturbances:    WNL  Orientation:  oriented to person, place, time/date, situation, day of week, month of year, year, and stated date of Oct. 2, 2025  Attention:  Good  Concentration:  Good  Memory:  WNL  Fund of knowledge:   Good  Insight:    Good  Judgment:   Good  Impulse Control:  Good   Risk Assessment: Danger to Self:  No Self-injurious Behavior: No Danger to Others: No Duty to Warn:no Physical Aggression / Violence:No  Access to Firearms a concern: No  Gang Involvement:No   Subjective:  Patient today in session focusing more on her grief re: husband's death, noting some of her progress as well as her challenges with diabetes recently. Is walking with neighbor some. Eating and resting better some. Already thinking about it's soon anniversary date of husband's death. Driving--having some anxiety.  Talking through in more detail some of her experiences of grief and loss and where she feels she is at this point, recognizing very clearly that I am not where I was several months ago, alluding to her progress.  Continues good family contact as well as contact with friends.  Some traveling.  Better managing her self-doubt as she is strengthening her belief in herself.  Continues to miss husband a lot and also notes that she has lots of really good memories to hang onto.   Decreased self-doubt.  Less comparing herself to others.  More optimistic.    Interventions: Cognitive Behavioral Therapy, Solution-Oriented/Positive Psychology, and Ego-Supportive Identify life losses and/or situations including from the past and in the present, that support current symptomology of anxiety/depression. 2.   Develop behavioral and cognitive strategies to reduce and work through excessive sadness, anxiety or depression.  Identify, challenge, and replace negative self talk with more positive, realistic, and empowering self talk. 3.    Patient will work on developing reality based, positive cognitive messages that can help improve her mood and outlook while also working on building self-confidence in her ability to work through significant loss    Diagnosis:   ICD-10-CM   1. Situational mixed anxiety and depressive disorder  F43.23      Plan:  Patient focusing today further on her grief, loss, sadness, and her gradual improvement in her grief. Talking through specific parts of her grief and loss which seemed helpful also encouraging as she is starting to see a little more of her progress.  Confidence increasing.  Continues to have more better days than difficult days.  Difficult days not lasting quite as long.  Feels encouraged when she does notice some improvement.  Working to let go of overthinking.  Remains and contact with supportive family and friends which contribute to her feeling more hopeful for the future and more peace and contentment on most days.  Goal review and progress/challenges noted with  patient.  Next appointment within 1 month.   Barnie Bunde, LCSW

## 2024-05-19 ENCOUNTER — Other Ambulatory Visit (HOSPITAL_BASED_OUTPATIENT_CLINIC_OR_DEPARTMENT_OTHER): Payer: Self-pay

## 2024-05-19 ENCOUNTER — Other Ambulatory Visit: Payer: Self-pay

## 2024-05-26 ENCOUNTER — Other Ambulatory Visit (HOSPITAL_BASED_OUTPATIENT_CLINIC_OR_DEPARTMENT_OTHER): Payer: Self-pay

## 2024-05-28 ENCOUNTER — Other Ambulatory Visit (HOSPITAL_BASED_OUTPATIENT_CLINIC_OR_DEPARTMENT_OTHER): Payer: Self-pay

## 2024-06-13 ENCOUNTER — Ambulatory Visit (INDEPENDENT_AMBULATORY_CARE_PROVIDER_SITE_OTHER): Admitting: Psychiatry

## 2024-06-13 DIAGNOSIS — F4323 Adjustment disorder with mixed anxiety and depressed mood: Secondary | ICD-10-CM

## 2024-06-13 NOTE — Progress Notes (Signed)
 [facility] Counselor/Therapist Progress Note  Patient ID: Kathryn Cummings, MRN: [id number],    Date: 06/13/2024  Time Spent: 55 minutes   Treatment Type: Individual Therapy  Reported Symptoms:  anxiety, sadness and grief re: husband's death, depression improved, but I do have a lot of gratitude   Mental Status Exam:  Appearance:   Neat and Well Groomed     Behavior:  Appropriate, Sharing, and Motivated  Motor:  Normal  Speech/Language:   Clear and Coherent  Affect:  Tearful and sad  Mood:  anxious, sad, and some depression  Thought process:  goal directed  Thought content:    Rumination  Sensory/Perceptual disturbances:    WNL  Orientation:  oriented to person, place, time/date, situation, day of week, month of year, year, and stated date of Oct. 28, 2025  Attention:  Good  Concentration:  Good  Memory:  WNL  Fund of knowledge:   Good  Insight:    Good  Judgment:   Good  Impulse Control:  Good   Risk Assessment: Danger to Self:  No Self-injurious Behavior: No Danger to Others: No Duty to Warn:no Physical Aggression / Violence:No  Access to Firearms a concern: No  Gang Involvement:No   Subjective: Patient in session today working further on her loss of husband and feeling the overwhelming absence since his death.  Husband's birthday would  have been on Oct. 18th and that was a tough time together with family. Shared about how she is managing her grief some better at times, although the grief can be sharp at times. Patient does feel she is some better in managing her grief, with very gradual improvement. Is participating in a current Bible Study which she is finding helpful emotionally, and spiritually. Working today further on her unresolved grief, acceptance, self love and caring. Continues to work on her grief and through certain memories re: her husband and talking through a lot of her grief, loss, but also noting some encouragement and growth in her loss, her grief, and  my whole life as I continue work on my grief/loss and noticing some growth.  Still getting out to walk some with neighbor.  Some anxiety when driving in certain areas where it tends to be a lot busier with traffic and is good about making decisions as to whether or not she wants to drive in that particular area or select a different way, or ride with someone else.  Good family contact and support.  Self-doubt is decreasing gradually and the belief in herself is continuing to be stronger.  Less self-doubt and less comparing herself with others.  Continues to feel a little more optimistic.   Interventions: Cognitive Behavioral Therapy, Solution-Oriented/Positive Psychology, and Ego-Supportive Identify life losses and/or situations including from the past and in the present, that support current symptomology of anxiety/depression. 2.   Develop behavioral and cognitive strategies to reduce and work through excessive sadness, anxiety or depression.  Identify, challenge, and replace negative self talk with more positive, realistic, and empowering self talk. 3.    Patient will work on developing reality based, positive cognitive messages that can help improve her mood and outlook while also working on building self-confidence in her ability to work through significant loss   Diagnosis:   ICD-10-CM   1. Situational mixed anxiety and depressive disorder  F43.23      Plan:  Patient  today in session and working further on her sadness, grief, loss, as well as her gradual improvement  in managing her grief.  As noted above she has made progress and continues to work on the challenges of excepting the death of her husband and moving forward.  She is able to see some clear steps in already moving forward and having more acceptance.  Confidence increasing.  Having more better days than challenging days.  Working further to let go of overthinking.  Does stay in contact with supportive family and friends which is  very helpful and supportive to her.  Goal review and progress/challenges noted with patient.  Next appointment within 1 month.   Barnie Bunde, LCSW

## 2024-07-17 ENCOUNTER — Ambulatory Visit

## 2024-07-17 VITALS — BP 120/62 | HR 76 | Temp 97.5°F | Ht 63.0 in | Wt 172.2 lb

## 2024-07-17 DIAGNOSIS — Z0000 Encounter for general adult medical examination without abnormal findings: Secondary | ICD-10-CM

## 2024-07-17 NOTE — Progress Notes (Signed)
 Chief Complaint  Patient presents with   Medicare Wellness     Subjective:   Kathryn Cummings is a 68 y.o. female who presents for a Medicare Annual Wellness Visit.  Allergies (verified) Bactrim [sulfamethoxazole-trimethoprim]   History: Past Medical History:  Diagnosis Date   Anxiety    Arthritis    Basal cell adenocarcinoma    Diverticulosis    Diverticulosis    Elevated cholesterol    GERD without esophagitis    Heartburn    Hiatal hernia    History of chicken pox    Insomnia    Kidney stone    1983   Migraine headache    Osteoarthritis    Lumbar spine; hands; feet   PONV (postoperative nausea and vomiting)    Prediabetes    UTI (lower urinary tract infection)    Past Surgical History:  Procedure Laterality Date   ABDOMINAL HYSTERECTOMY  1988   APPENDECTOMY  1988   BASAL CELL CARCINOMA EXCISION     Multiple   BLADDER REPAIR  2000   Mesh   BREAST BIOPSY  1990   CHOLECYSTECTOMY     EXCISION HAGLUND'S DEFORMITY WITH ACHILLES TENDON REPAIR Right 10/30/2021   Procedure: Excision of haglund deformity, right Achilles tendon reconstruction;  Surgeon: Kit Rush, MD;  Location: [facility];  Service: Orthopedics;  Laterality: Right;   GASTROC RECESSION EXTREMITY Right 10/30/2021   Procedure: Right gastroc recession;  Surgeon: Kit Rush, MD;  Location: [facility];  Service: Orthopedics;  Laterality: Right;   Family History  Problem Relation Age of Onset   Arthritis-Osteo Mother        Living   Diabetes Mother    Hyperlipidemia Mother    Hypertension Mother    Arthritis-Osteo Father        Living   Diabetes Father    Hypertension Father    Cancer Sister        Skin   Arthritis Sister    Healthy Sister        x1   Arthritis Brother    Cancer Brother        Skin   Healthy Brother        x1   Healthy Son        x2   Breast cancer Maternal Aunt    Heart attack Maternal Grandmother    Alzheimer's disease Maternal Grandmother     Heart attack Maternal Grandfather    Dementia Paternal Grandmother    Cancer Paternal Grandfather        Liver   Pancreatic cancer Paternal Grandfather    Colon cancer Neg Hx    Colon polyps Neg Hx    Liver disease Neg Hx    Esophageal cancer Neg Hx    Stomach cancer Neg Hx    Rectal cancer Neg Hx    Social History   Occupational History   Not on file  Tobacco Use   Smoking status: Never   Smokeless tobacco: Never  Vaping Use   Vaping status: Never Used  Substance and Sexual Activity   Alcohol use: Yes    Alcohol/week: 2.0 standard drinks of alcohol    Types: 2 Standard drinks or equivalent per week    Comment: 2 times a month wine/beer   Drug use: No   Sexual activity: Yes    Partners: Male   Tobacco Counseling Counseling given: No  SDOH Screenings   Food Insecurity: No Food Insecurity (07/17/2024)  Housing: Low Risk  (  07/17/2024)  Transportation Needs: No Transportation Needs (07/17/2024)  Utilities: Not At Risk (07/17/2024)  Alcohol Screen: Low Risk  (07/14/2024)  Depression (PHQ2-9): Low Risk  (07/17/2024)  Financial Resource Strain: Low Risk  (07/14/2024)  Physical Activity: Insufficiently Active (07/17/2024)  Social Connections: Moderately Integrated (07/17/2024)  Stress: Stress Concern Present (07/17/2024)  Tobacco Use: Low Risk  (07/17/2024)  Health Literacy: Adequate Health Literacy (07/17/2024)   See flowsheets for full screening details  Depression Screen PHQ 2 & 9 Depression Scale- Over the past 2 weeks, how often have you been bothered by any of the following problems? Little interest or pleasure in doing things: 0 Feeling down, depressed, or hopeless (PHQ Adolescent also includes...irritable): 0 PHQ-2 Total Score: 0 Trouble falling or staying asleep, or sleeping too much: 1 Feeling tired or having little energy: 1 Poor appetite or overeating (PHQ Adolescent also includes...weight loss): 0 Feeling bad about yourself - or that you are a failure or have  let yourself or your family down: 0 Trouble concentrating on things, such as reading the newspaper or watching television (PHQ Adolescent also includes...like school work): 0 Moving or speaking so slowly that other people could have noticed. Or the opposite - being so fidgety or restless that you have been moving around a lot more than usual: 0 Thoughts that you would be better off dead, or of hurting yourself in some way: 0 PHQ-9 Total Score: 2 If you checked off any problems, how difficult have these problems made it for you to do your work, take care of things at home, or get along with other people?: Not difficult at all  Depression Treatment Depression Interventions/Treatment : EYV7-0 Score <4 Follow-up Not Indicated     Goals Addressed               This Visit's Progress     Increase physical activity (pt-stated)        Remain active.       Visit info / Clinical Intake: Medicare Wellness Visit Type:: Subsequent Annual Wellness Visit Persons participating in visit:: patient Medicare Wellness Visit Mode:: In-person (required for WTM) Information given by:: patient Interpreter Needed?: No Pre-visit prep was completed: yes AWV questionnaire completed by patient prior to visit?: yes Date:: 07/14/24 Living arrangements:: (!) lives alone Patient's Overall Health Status Rating: very good Typical amount of pain: none Does pain affect daily life?: no Are you currently prescribed opioids?: no  Dietary Habits and Nutritional Risks How many meals a day?: 3 Eats fruit and vegetables daily?: yes Most meals are obtained by: preparing own meals In the last 2 weeks, have you had any of the following?: none Diabetic:: (!) yes Any non-healing wounds?: no How often do you check your BS?: as needed Would you like to be referred to a Nutritionist or for Diabetic Management? : no  Functional Status Activities of Daily Living (to include ambulation/medication):  Independent Ambulation: Independent with device- listed below Home Assistive Devices/Equipment: Eyeglasses Medication Administration: Independent Home Management: Independent Manage your own finances?: yes Primary transportation is: driving Concerns about vision?: no *vision screening is required for WTM* Concerns about hearing?: no  Fall Screening Falls in the past year?: 0 Number of falls in past year: 0 Was there an injury with Fall?: 0 Fall Risk Category Calculator: 0 Patient Fall Risk Level: Low Fall Risk  Fall Risk Patient at Risk for Falls Due to: No Fall Risks Fall risk Follow up: Falls evaluation completed  Home and Transportation Safety: All rugs have non-skid backing?:  yes All stairs or steps have railings?: yes Grab bars in the bathtub or shower?: yes Have non-skid surface in bathtub or shower?: yes Good home lighting?: yes Regular seat belt use?: yes Hospital stays in the last year:: no  Cognitive Assessment Difficulty concentrating, remembering, or making decisions? : no Will 6CIT or Mini Cog be Completed: no 6CIT or Mini Cog Declined: patient alert, oriented, able to answer questions appropriately and recall recent events  Advance Directives (For Healthcare) Does Patient Have a Medical Advance Directive?: Yes Does patient want to make changes to medical advance directive?: No - Patient declined Type of Advance Directive: Healthcare Power of [place]; Living will Copy of Healthcare Power of Attorney in Chart?: No - copy requested Copy of Living Will in Chart?: No - copy requested  Reviewed/Updated  Reviewed/Updated: Reviewed All (Medical, Surgical, Family, Medications, Allergies, Care Teams, Patient Goals)        Objective:    Today's Vitals   07/17/24 0914  BP: 120/62  Pulse: 76  Temp: (!) 97.5 F (36.4 C)  TempSrc: Oral  SpO2: 97%  Weight: 172 lb 3.2 oz (78.1 kg)  Height: 5' 3 (1.6 m)   Body mass index is 30.5 kg/m.  Current Medications  (verified) Outpatient Encounter Medications as of 07/17/2024  Medication Sig   ALPRAZolam  (XANAX ) 0.5 MG tablet Take 1 tablet (0.5 mg total) by mouth 2 (two) times daily as needed for anxiety.   atorvastatin  (LIPITOR) 10 MG tablet Take 1 tablet (10 mg total) by mouth daily.   busPIRone  (BUSPAR ) 10 MG tablet Take 1 tablet (10 mg total) by mouth 2 (two) times daily.   celecoxib  (CELEBREX ) 200 MG capsule Take 1 capsule (200 mg total) by mouth daily.   cetirizine (ZYRTEC) 10 MG chewable tablet Chew 10 mg by mouth daily as needed for allergies.   Coenzyme Q10 (COQ-10 PO) Take by mouth.   ezetimibe  (ZETIA ) 10 MG tablet Take 1 tablet (10 mg total) by mouth daily.   fenofibrate  micronized (LOFIBRA) 134 MG capsule Take 1 capsule (134 mg total) by mouth daily before breakfast.   fluocinonide  (LIDEX ) 0.05 % external solution Apply 1 application to the scalp at bedtime x 1-2 weeks as needed for itching and scaling   Nutritional Supplements (JUICE PLUS FIBRE) LIQD Take by mouth daily.   Omega-3 Fatty Acids (FISH OIL) 1000 MG CAPS Take 1 capsule by mouth daily.   omeprazole  (PRILOSEC) 40 MG capsule Take 1 capsule (40 mg total) by mouth daily.   No facility-administered encounter medications on file as of 07/17/2024.   Hearing/Vision screen Hearing Screening - Comments:: Denies hearing difficulties   Vision Screening - Comments:: Wears rx glasses - up to date with routine eye exams with  [facility]. Immunizations and Health Maintenance Health Maintenance  Topic Date Due   Pneumococcal Vaccine: 50+ Years (1 of 2 - PCV) Never done   COVID-19 Vaccine (3 - Pfizer risk series) 12/19/2019   Mammogram  06/29/2020   Influenza Vaccine  03/17/2024   Bone Density Scan  12/08/2024   Diabetic kidney evaluation - eGFR measurement  05/15/2025   Diabetic kidney evaluation - Urine ACR  05/15/2025   Medicare Annual Wellness (AWV)  07/17/2025   DTaP/Tdap/Td (2 - Td or Tdap) 02/02/2028   Colonoscopy  09/17/2028    Hepatitis C Screening  Completed   Zoster Vaccines- Shingrix  Completed   Meningococcal B Vaccine  Aged Out        Assessment/Plan:  This is a routine wellness  examination for [place].  Patient Care Team: Billy Philippe SAUNDERS, NP as PCP - General (Family Medicine) Thukkani, Arun K, MD as PCP - Cardiology (Cardiology) Lynnell Nottingham, MD as Consulting Physician (Dermatology) Danielle Rom, MD as Consulting Physician (Obstetrics and Gynecology) Leslee Reusing, MD as Consulting Physician (Ophthalmology)  I have personally reviewed and noted the following in the patient's chart:   Medical and social history Use of alcohol, tobacco or illicit drugs  Current medications and supplements including opioid prescriptions. Functional ability and status Nutritional status Physical activity Advanced directives List of other physicians Hospitalizations, surgeries, and ER visits in previous 12 months Vitals Screenings to include cognitive, depression, and falls Referrals and appointments  No orders of the defined types were placed in this encounter.  In addition, I have reviewed and discussed with patient certain preventive protocols, quality metrics, and best practice recommendations. A written personalized care plan for preventive services as well as general preventive health recommendations were provided to patient.   Rojelio LELON Blush, LPN   [id number]   Return in 1 year on 07/23/25  After Visit Summary: (In Person-Declined) Patient declined AVS at this time.  Nurse Notes: None

## 2024-07-17 NOTE — Patient Instructions (Addendum)
 Kathryn Cummings,  Thank you for taking the time for your Medicare Wellness Visit. I appreciate your continued commitment to your health goals. Please review the care plan we discussed, and feel free to reach out if I can assist you further.  Please note that Annual Wellness Visits do not include a physical exam. Some assessments may be limited, especially if the visit was conducted virtually. If needed, we may recommend an in-person follow-up with your provider.  Ongoing Care Seeing your primary care provider every 3 to 6 months helps us  monitor your health and provide consistent, personalized care.   Referrals If a referral was made during today's visit and you haven't received any updates within two weeks, please contact the referred provider directly to check on the status.  Recommended Screenings:  Health Maintenance  Topic Date Due   Pneumococcal Vaccine for age over 26 (1 of 2 - PCV) Never done   COVID-19 Vaccine (3 - Pfizer risk series) 12/19/2019   Breast Cancer Screening  06/29/2020   Flu Shot  03/17/2024   Osteoporosis screening with Bone Density Scan  12/08/2024   Yearly kidney function blood test for diabetes  05/15/2025   Yearly kidney health urinalysis for diabetes  05/15/2025   Medicare Annual Wellness Visit  07/17/2025   DTaP/Tdap/Td vaccine (2 - Td or Tdap) 02/02/2028   Colon Cancer Screening  09/17/2028   Hepatitis C Screening  Completed   Zoster (Shingles) Vaccine  Completed   Meningitis B Vaccine  Aged Out       07/17/2024    9:23 AM  Advanced Directives  Does Patient Have a Medical Advance Directive? Yes  Type of Estate Agent of [place];Living will  Does patient want to make changes to medical advance directive? No - Patient declined  Copy of Healthcare Power of Attorney in Chart? No - copy requested    Vision: Annual vision screenings are recommended for early detection of glaucoma, cataracts, and diabetic retinopathy. These exams can also  reveal signs of chronic conditions such as diabetes and high blood pressure.  Dental: Annual dental screenings help detect early signs of oral cancer, gum disease, and other conditions linked to overall health, including heart disease and diabetes.  Please see the attached documents for additional preventive care recommendations.

## 2024-07-18 ENCOUNTER — Ambulatory Visit: Admitting: Psychiatry

## 2024-07-18 DIAGNOSIS — F4323 Adjustment disorder with mixed anxiety and depressed mood: Secondary | ICD-10-CM

## 2024-07-18 NOTE — Progress Notes (Signed)
      [facility] Counselor/Therapist Progress Note  Patient ID: Kathryn Cummings, MRN: [id number],    Date: 07/18/2024  Time Spent: 55 minutes   Treatment Type: Individual Therapy   Reported Symptoms: anxiety, sadness and grief re: holidays and husband's death earlier this year,    Mental Status Exam:  Appearance:   Casual and Well Groomed     Behavior:  Appropriate, Sharing, and Motivated  Motor:  Normal  Speech/Language:   Clear and Coherent  Affect:  Depressed and some tearfulness  Mood:  anxious, depressed, and sad  Thought process:  goal directed  Thought content:    Rumination  Sensory/Perceptual disturbances:    WNL  Orientation:  oriented to person, place, time/date, situation, day of week, month of year, year, and stated date of Dec. 2, 2025  Attention:  Good  Concentration:  Good  Memory:  WNL  Fund of knowledge:   Good  Insight:    Good  Judgment:   Good  Impulse Control:  Good   Risk Assessment: Danger to Self:  No Self-injurious Behavior: No Danger to Others: No Duty to Warn:no Physical Aggression / Violence:No  Access to Firearms a concern: No  Gang Involvement:No   Subjective:  Patient today in session continuing to work regarding her grief in the loss of her husband. Thanksgiving holiday was rough in terms of her grief but did connect with a couple of friends and they all went out for lunch together and she actually had a good time although sad as this was first Thanksgiving Holiday since husband's death earlier this year. Having some bittersweet moments as the holidays get closer, and sharing  this today in session. Reviewing some of her grief and struggles since husband's death and found that helpful and in some ways strengthening. Doing well in talking through some of her further grief, stress, and uncertainties, and noticeably feeling some increased confidence in her getting through her grief/sadness in healthier ways, and at times feeling gradually more  hopeful which is encouraging to patient. Walking some with a neighbor. Really good family support. Less comparing herself to others.    Interventions: Cognitive Behavioral Therapy, Solution-Oriented/Positive Psychology, and Ego-Supportive Identify life losses and/or situations including from the past and in the present, that support current symptomology of anxiety/depression. 2.   Develop behavioral and cognitive strategies to reduce and work through excessive sadness, anxiety or depression.  Identify, challenge, and replace negative self talk with more positive, realistic, and empowering self talk. 3.    Patient will work on developing reality based, positive cognitive messages that can help improve her mood and outlook while also working on building self-confidence in her ability to work through significant loss   Diagnosis:   ICD-10-CM   1. Situational mixed anxiety and depressive disorder  F43.23      Plan:  Patient in session today continuing to work further on resolving her sadness, loss, grief, but also noticing some further improvement in the management of her grief over time. More better days but also some challenges the closer we get to the holidays. Staying in contact with supportive people. Some improvement in self-confidence.   Goal review and progress/challenges noted with patient.  Next appointment within 1 month.   Barnie Bunde, LCSW

## 2024-07-26 ENCOUNTER — Other Ambulatory Visit: Payer: Self-pay | Admitting: Family Medicine

## 2024-07-26 ENCOUNTER — Other Ambulatory Visit: Payer: Self-pay

## 2024-07-26 ENCOUNTER — Other Ambulatory Visit (HOSPITAL_BASED_OUTPATIENT_CLINIC_OR_DEPARTMENT_OTHER): Payer: Self-pay

## 2024-07-26 DIAGNOSIS — F411 Generalized anxiety disorder: Secondary | ICD-10-CM

## 2024-07-27 ENCOUNTER — Other Ambulatory Visit (HOSPITAL_BASED_OUTPATIENT_CLINIC_OR_DEPARTMENT_OTHER): Payer: Self-pay

## 2024-07-27 MED ORDER — ALPRAZOLAM 0.5 MG PO TABS
0.5000 mg | ORAL_TABLET | Freq: Two times a day (BID) | ORAL | 0 refills | Status: DC | PRN
  Filled 2024-07-27: qty 30, 15d supply, fill #0

## 2024-08-07 ENCOUNTER — Ambulatory Visit: Admitting: Psychiatry

## 2024-08-07 ENCOUNTER — Encounter: Payer: Self-pay | Admitting: Family Medicine

## 2024-08-07 DIAGNOSIS — F4323 Adjustment disorder with mixed anxiety and depressed mood: Secondary | ICD-10-CM

## 2024-08-07 NOTE — Progress Notes (Signed)
 "       [facility] Counselor/Therapist Progress Note  Patient ID: Kathryn Cummings, MRN: [id number],    Date: 08/07/2024  Time Spent: 55 minutes   Treatment Type: Individual Therapy  Reported Symptoms: Anxiety, sadness and grief regarding holidays and husband's death earlier this year    Mental Status Exam:  Appearance:   Well Groomed     Behavior:  Appropriate, Sharing, and Motivated  Motor:  Normal  Speech/Language:   Clear and Coherent  Affect:  Anxious, some depression  Mood:  anxious, depressed, and grief  Thought process:  goal directed  Thought content:    Rumination  Sensory/Perceptual disturbances:    WNL  Orientation:  oriented to person, place, time/date, situation, day of week, month of year, year, and stated date of Dec. 22, 2025  Attention:  Good  Concentration:  Good  Memory:  WNL  Fund of knowledge:   Good  Insight:    Good  Judgment:   Good  Impulse Control:  Good   Risk Assessment: Danger to Self:  No Self-injurious Behavior: No Danger to Others: No Duty to Warn:no Physical Aggression / Violence:No  Access to Firearms a concern: No  Gang Involvement:No   Subjective:  Patient in session today and working further on her grief over loss of her husband earlier this year. Reminiscing some about holidays and her difficulty with missing husband. Tearfully processing more of her grief in the loss of her husband earlier this year and am really feeling the grief, which she processed in more detail in session today. Second-guessing herself in some ways and talking further today re: her grief journey and all that is involved with it.  Also processed some feelings about the good marriage that she did have with her husband and how that helps her at times feel more assured but it other times, really missing that relationship.  Really good work on her grief today and is also checking in with her med provider about a bit of a change during this time of increased grief and  sadness here at the holidays.  Having a lot of bittersweet moments in between Thanksgiving and Christmas holidays and is hopeful that she will feel better eventually heading into another year.  Talked through more of her grief today regarding her husband's death and that seemed helpful to her as she eventually seem to feel more grounded.  Encouraged to stay in good contact with her neighbors and other friends, and her family.   Interventions: Cognitive Behavioral Therapy, Solution-Oriented/Positive Psychology, and Ego-Supportive Identify life losses and/or situations including from the past and in the present, that support current symptomology of anxiety/depression. 2.   Develop behavioral and cognitive strategies to reduce and work through excessive sadness, anxiety or depression.  Identify, challenge, and replace negative self talk with more positive, realistic, and empowering self talk. 3.    Patient will work on developing reality based, positive cognitive messages that can help improve her mood and outlook while also working on building self-confidence in her ability to work through significant loss   Diagnosis:   ICD-10-CM   1. Situational mixed anxiety and depressive disorder  F43.23      Plan: Patient today working really hard in session as she shared and talk through more of her sadness, grief, and loss.  Is also noticing some of her strength at times and then at other times not sure where it went.  Is having some days that are not quite as challenging but  realized how right here at the holidays that the grief became some stronger.  Plans to be with good people during the holidays with which she feels very comfortable including her family.  Is checking with her doctors office about some medication they provided for her earlier when husband first died and checking to see if she might could be on a lower dose of something that might help just during the holiday time with her anxiety and  stress/sadness.  Denies any thoughts to harm herself or others.  Goal review and progress/challenges noted with patient.  Next appointment within 1 month.   Barnie Bunde, LCSW                   "

## 2024-08-15 ENCOUNTER — Ambulatory Visit (INDEPENDENT_AMBULATORY_CARE_PROVIDER_SITE_OTHER): Admitting: Family Medicine

## 2024-08-15 VITALS — BP 124/82 | HR 67 | Temp 98.3°F | Ht 63.0 in | Wt 173.0 lb

## 2024-08-15 DIAGNOSIS — K219 Gastro-esophageal reflux disease without esophagitis: Secondary | ICD-10-CM

## 2024-08-15 DIAGNOSIS — F411 Generalized anxiety disorder: Secondary | ICD-10-CM | POA: Diagnosis not present

## 2024-08-15 DIAGNOSIS — E119 Type 2 diabetes mellitus without complications: Secondary | ICD-10-CM | POA: Diagnosis not present

## 2024-08-15 DIAGNOSIS — E782 Mixed hyperlipidemia: Secondary | ICD-10-CM | POA: Diagnosis not present

## 2024-08-15 LAB — COMPREHENSIVE METABOLIC PANEL WITH GFR
ALT: 28 U/L (ref 3–35)
AST: 25 U/L (ref 5–37)
Albumin: 4.5 g/dL (ref 3.5–5.2)
Alkaline Phosphatase: 51 U/L (ref 39–117)
BUN: 25 mg/dL — ABNORMAL HIGH (ref 6–23)
CO2: 29 meq/L (ref 19–32)
Calcium: 10.1 mg/dL (ref 8.4–10.5)
Chloride: 99 meq/L (ref 96–112)
Creatinine, Ser: 0.99 mg/dL (ref 0.40–1.20)
GFR: 58.67 mL/min — ABNORMAL LOW
Glucose, Bld: 108 mg/dL — ABNORMAL HIGH (ref 70–99)
Potassium: 4.1 meq/L (ref 3.5–5.1)
Sodium: 137 meq/L (ref 135–145)
Total Bilirubin: 0.5 mg/dL (ref 0.2–1.2)
Total Protein: 7.1 g/dL (ref 6.0–8.3)

## 2024-08-15 LAB — LIPID PANEL
Cholesterol: 165 mg/dL (ref 28–200)
HDL: 79.9 mg/dL
LDL Cholesterol: 68 mg/dL (ref 10–99)
NonHDL: 85.37
Total CHOL/HDL Ratio: 2
Triglycerides: 89 mg/dL (ref 10.0–149.0)
VLDL: 17.8 mg/dL (ref 0.0–40.0)

## 2024-08-15 LAB — HEMOGLOBIN A1C: Hgb A1c MFr Bld: 6.2 % (ref 4.6–6.5)

## 2024-08-15 NOTE — Progress Notes (Unsigned)
" ° °  Established Patient Office Visit   Subjective:  Patient ID: Kathryn Cummings, female    DOB: [birth year]  Age: 68 y.o. MRN: [id number]  Chief Complaint  Patient presents with   Medical Management of Chronic Issues    3 month follow up     HPI Anxiety: Patient is currently taking Buspar  15mg  BID. Increased medication about a week ago through [employer] since her anxiety and depression has become worse during the holiday. Also, takes Alprazolam  0.5mg  tablet at night PRN for insomnia since it is hard for her to stay sleep or wakes up frequently.She reports she has took more since November, more related to the holidays. Patient lost her spouse on 09/16/2023. She is in counseling with Barnie Bunde with [facility]. This has been beneficial too. Doing monthly session.    GERD: She takes Omeprazole  40mg  daily. Effective. She reports if she misses the medication, she will have symptoms. She dose have a hiatal hernia.    Hyperlipidemia: Patient is taking Atorvastatin  10mg  daily, Ezetimibe  10mg  daily, and Fenofibrate  134mcg daily, along with OCT Co-Q10 and Omega-3 fatty acid. She last seen Josefa Beauvais, NP with [facility] on 05/25/2023. She was instructed to follow PRN with provider or Dr. Wendel.  Lab Results  Component Value Date   CHOL 171 05/15/2024   HDL 82.30 05/15/2024   LDLCALC 63 05/15/2024   LDLDIRECT 145.0 03/17/2023   TRIG 129.0 05/15/2024   CHOLHDL 2 05/15/2024    On last lab, A1c has increased from prediabetes to diabetic. She reports she is walking more, but still having trouble with eating as one person since the loss of her spouse earlier this year. ROS See HPI above     Objective:   BP 124/82   Pulse 67   Temp 98.3 F (36.8 C) (Oral)   Ht 5' 3 (1.6 m)   Wt 173 lb (78.5 kg)   SpO2 96%   BMI 30.65 kg/m  Wt Readings from Last 3 Encounters:  08/15/24 173 lb (78.5 kg)  07/17/24 172 lb 3.2 oz (78.1 kg)  05/15/24 165 lb (74.8 kg)       Physical Exam  No results found for any visits on 08/15/24.  The 10-year ASCVD risk score (Arnett DK, et al., 2019) is: 11.4%    Assessment & Plan:  Gastroesophageal reflux disease without esophagitis  Generalized anxiety disorder -     DRUG MONITOR, PANEL 1, SCREEN, URINE  Mixed hyperlipidemia -     Comprehensive metabolic panel with GFR -     Lipid panel  Type 2 diabetes mellitus without complication, without long-term current use of insulin (HCC) -     Comprehensive metabolic panel with GFR -     Hemoglobin A1c  1.Review health maintenance;  -Mammogram: [facility] with Dr. Rolan April 2025 -  No follow-ups on file.   Yulieth Carrender, NP "

## 2024-08-16 ENCOUNTER — Other Ambulatory Visit: Payer: Self-pay

## 2024-08-16 ENCOUNTER — Ambulatory Visit: Payer: Self-pay | Admitting: Family Medicine

## 2024-08-16 ENCOUNTER — Encounter: Payer: Self-pay | Admitting: Family Medicine

## 2024-08-16 ENCOUNTER — Other Ambulatory Visit (HOSPITAL_BASED_OUTPATIENT_CLINIC_OR_DEPARTMENT_OTHER): Payer: Self-pay

## 2024-08-16 ENCOUNTER — Other Ambulatory Visit: Payer: Self-pay | Admitting: Family Medicine

## 2024-08-16 DIAGNOSIS — F411 Generalized anxiety disorder: Secondary | ICD-10-CM

## 2024-08-16 DIAGNOSIS — E119 Type 2 diabetes mellitus without complications: Secondary | ICD-10-CM | POA: Insufficient documentation

## 2024-08-16 DIAGNOSIS — K219 Gastro-esophageal reflux disease without esophagitis: Secondary | ICD-10-CM

## 2024-08-16 DIAGNOSIS — E782 Mixed hyperlipidemia: Secondary | ICD-10-CM

## 2024-08-16 LAB — DRUG MONITOR, PANEL 1, SCREEN, URINE
Amphetamines: NEGATIVE ng/mL
Barbiturates: NEGATIVE ng/mL
Benzodiazepines: POSITIVE ng/mL — AB
Cocaine Metabolite: NEGATIVE ng/mL
Creatinine: 30.9 mg/dL
Marijuana Metabolite: NEGATIVE ng/mL
Methadone Metabolite: NEGATIVE ng/mL
Opiates: NEGATIVE ng/mL
Oxidant: NEGATIVE ug/mL
Oxycodone: NEGATIVE ng/mL
Phencyclidine: NEGATIVE ng/mL
pH: 5.1 (ref 4.5–9.0)

## 2024-08-16 LAB — DM TEMPLATE

## 2024-08-16 MED ORDER — OMEPRAZOLE 40 MG PO CPDR
40.0000 mg | DELAYED_RELEASE_CAPSULE | Freq: Every day | ORAL | 2 refills | Status: AC
  Filled 2024-08-16: qty 90, 90d supply, fill #0

## 2024-08-16 MED ORDER — BUSPIRONE HCL 15 MG PO TABS
15.0000 mg | ORAL_TABLET | Freq: Two times a day (BID) | ORAL | 0 refills | Status: AC
  Filled 2024-08-16: qty 180, 90d supply, fill #0

## 2024-08-16 MED ORDER — ALPRAZOLAM 0.5 MG PO TABS
0.5000 mg | ORAL_TABLET | Freq: Two times a day (BID) | ORAL | 0 refills | Status: AC | PRN
  Filled 2024-08-16: qty 60, 30d supply, fill #0

## 2024-08-16 MED ORDER — EZETIMIBE 10 MG PO TABS
10.0000 mg | ORAL_TABLET | Freq: Every day | ORAL | 3 refills | Status: AC
  Filled 2024-08-16: qty 90, 90d supply, fill #0

## 2024-08-16 NOTE — Assessment & Plan Note (Signed)
 Stable. Scored 2 on PHQ-9 and 3 on GAD-7. Continue taking Alprazolam  0.5mg  PRN and Buspar  15 BID. UDS ordered and controlled substance contract UTD (04/2024).

## 2024-08-16 NOTE — Assessment & Plan Note (Signed)
 Continue Atorvastatin  10mg  daily, Ezetimibe  10mg  daily, and Fenofibrate  134mcg daily, along with OCT Co-Q10 and Omega-3 fatty acid. Previously seen cardiology, but instructed to follow up PRN. Ordered lipids and CMP.  Last ate breakfast over 5 hours ago.

## 2024-08-16 NOTE — Patient Instructions (Signed)
-  It was great to see you. Happy New Year! -Continue all medications. -Ordered labs and urine. Office will call with lab results and will be available via MyChart. -Follow up in 3 months.

## 2024-08-16 NOTE — Assessment & Plan Note (Signed)
Continue with Omeprazole  daily. It is effective.

## 2024-08-16 NOTE — Progress Notes (Signed)
 Refilled all medications. UDS and controlled substance contract UTD for Xanax . PDMP reviewed.

## 2024-08-16 NOTE — Assessment & Plan Note (Signed)
 Last A1c was 6.5. Decided to work on lifestyle changes. Ordered A1c and CMP.

## 2024-08-21 ENCOUNTER — Other Ambulatory Visit (HOSPITAL_BASED_OUTPATIENT_CLINIC_OR_DEPARTMENT_OTHER): Payer: Self-pay

## 2024-08-25 ENCOUNTER — Encounter: Payer: Self-pay | Admitting: Family Medicine

## 2024-08-25 ENCOUNTER — Other Ambulatory Visit (HOSPITAL_BASED_OUTPATIENT_CLINIC_OR_DEPARTMENT_OTHER): Payer: Self-pay

## 2024-08-25 ENCOUNTER — Ambulatory Visit: Admitting: Family Medicine

## 2024-08-25 VITALS — BP 130/82 | HR 74 | Temp 98.3°F | Ht 63.0 in | Wt 174.0 lb

## 2024-08-25 DIAGNOSIS — Z7985 Long-term (current) use of injectable non-insulin antidiabetic drugs: Secondary | ICD-10-CM | POA: Diagnosis not present

## 2024-08-25 DIAGNOSIS — E119 Type 2 diabetes mellitus without complications: Secondary | ICD-10-CM | POA: Diagnosis not present

## 2024-08-25 MED ORDER — TIRZEPATIDE 2.5 MG/0.5ML ~~LOC~~ SOAJ
2.5000 mg | SUBCUTANEOUS | 0 refills | Status: DC
  Filled 2024-08-25: qty 2, 28d supply, fill #0

## 2024-08-25 NOTE — Progress Notes (Signed)
" ° °  Established Patient Office Visit   Subjective:  Patient ID: Kathryn Cummings, female    DOB: [birth year]  Age: 69 y.o. MRN: [id number]  Chief Complaint  Patient presents with   Medical Management of Chronic Issues    Start new medication     HPI Patient is present to discuss about starting Mounjaro  for diabetes. Within the last 6 months, she has had two A1c readings of 6.7 and 6.5. Also, steady gaining weight with a high cholesterol. Has a history of probable hepatic steatosis based on CT abd back in 2023.  She is trying to work on lifestyle changes of diet and exercise.  ROS See HPI above     Objective:   BP 130/82   Pulse 74   Temp 98.3 F (36.8 C) (Oral)   Ht 5' 3 (1.6 m)   Wt 174 lb (78.9 kg)   SpO2 97%   BMI 30.82 kg/m  Wt Readings from Last 3 Encounters:  08/25/24 174 lb (78.9 kg)  08/15/24 173 lb (78.5 kg)  07/17/24 172 lb 3.2 oz (78.1 kg)    Physical Exam Vitals reviewed.  Constitutional:      General: She is not in acute distress.    Appearance: Normal appearance. She is obese. She is not ill-appearing, toxic-appearing or diaphoretic.  HENT:     Head: Normocephalic and atraumatic.  Eyes:     General:        Right eye: No discharge.        Left eye: No discharge.     Conjunctiva/sclera: Conjunctivae normal.  Cardiovascular:     Rate and Rhythm: Normal rate and regular rhythm.     Heart sounds: Normal heart sounds. No murmur heard.    No friction rub. No gallop.  Pulmonary:     Effort: Pulmonary effort is normal. No respiratory distress.     Breath sounds: Normal breath sounds.  Musculoskeletal:        General: Normal range of motion.  Skin:    General: Skin is warm and dry.  Neurological:     General: No focal deficit present.     Mental Status: She is alert and oriented to person, place, and time. Mental status is at baseline.  Psychiatric:        Mood and Affect: Mood normal.        Behavior: Behavior normal.        Thought Content: Thought content  normal.        Judgment: Judgment normal.      Assessment & Plan:  Type 2 diabetes mellitus without complication, without long-term current use of insulin (HCC) -     Tirzepatide ; Inject 2.5 mg into the skin once a week.  Dispense: 2 mL; Refill: 0  -Discussed about starting Mounjaro  0.25mg  injection every 7 days for diabetes management. Discussed about common side effects of medication.  -Continue to work on lifestyle changes with a healthy diet  and regular exercise. -Follow up in 1 month once obtaining medication.   Return in about 1 month (around 09/25/2024) for follow-up.   Mujahid Jalomo, NP "

## 2024-08-25 NOTE — Patient Instructions (Signed)
-  It was great to see you this afternoon.  -Discussed about starting Mounjaro  0.25mg  injection every 7 days for diabetes management. Discussed about common side effects of medication.  -Continue to work on lifestyle changes with a healthy diet  and regular exercise. -Follow up in 1 month once obtaining medication.

## 2024-08-26 ENCOUNTER — Other Ambulatory Visit (HOSPITAL_BASED_OUTPATIENT_CLINIC_OR_DEPARTMENT_OTHER): Payer: Self-pay

## 2024-08-29 ENCOUNTER — Ambulatory Visit: Admitting: Psychiatry

## 2024-08-29 DIAGNOSIS — F4323 Adjustment disorder with mixed anxiety and depressed mood: Secondary | ICD-10-CM

## 2024-08-29 NOTE — Progress Notes (Signed)
 "       [facility] Counselor/Therapist Progress Note  Patient ID: Saliyah Gillin, MRN: [id number],    Date: 08/29/2024  Time Spent: 55 minutes   Treatment Type: Individual Therapy  Reported Symptoms: anxiety, sadness and grief regarding recent holidays, husband's death last year    Mental Status Exam:  Appearance:   Casual     Behavior:  Appropriate, Sharing, and Motivated  Motor:  Normal  Speech/Language:   Clear and Coherent  Affect:  Appropriate, Depressed, and anxiety  Mood:  anxious and depressed  Thought process:  goal directed  Thought content:    WNL  Sensory/Perceptual disturbances:    WNL  Orientation:  oriented to person, place, time/date, situation, day of week, month of year, year, and stated date of Jan. 13, 2026  Attention:  Good  Concentration:  Good  Memory:  WNL  Fund of knowledge:   Good  Insight:    Good  Judgment:   Good  Impulse Control:  Good   Risk Assessment: Danger to Self:  No Self-injurious Behavior: No Danger to Others: No Duty to Warn:no Physical Aggression / Violence:No  Access to Firearms a concern: No  Gang Involvement:No   Subjective:  Patient showing good motivation in session today working further on her grief issues regarding the loss of her husband within the past year and most recently being a significant holiday without husband's presence here. Describing how Christmas and the holiday time was for her, and felt some strength through it all, even though difficult at times. Talking through her experiences with family over the holidays; and being able to move more in a positive direction.  Trying to believe in herself more and not be self judging.  More accepting of her grief journey and very grateful for the relationship she had with her husband for a number of years.  Is starting to feel more grounded at times which is also encouraging for patient.  Does keep good contact with supportive neighbors and friends as well as her extended family.   Seems to recognize her strength more at this point and that is encouraging for her, even though there can be some ups and downs, as she can recall when there were not many ups.     Interventions: Cognitive Behavioral Therapy, Solution-Oriented/Positive Psychology, and Ego-Supportive Identify life losses and/or situations including from the past and in the present, that support current symptomology of anxiety/depression. 2.   Develop behavioral and cognitive strategies to reduce and work through excessive sadness, anxiety or depression.  Identify, challenge, and replace negative self talk with more positive, realistic, and empowering self talk. 3.    Patient will work on developing reality based, positive cognitive messages that can help improve her mood and outlook while also working on building self-confidence in her ability to work through significant loss    Diagnosis:   ICD-10-CM   1. Situational mixed anxiety and depressive disorder  F43.23      Plan:   Patient today working further on some unresolved grief and being able to move forward.  In the midst of session she worked on her grief, sadness, her sense of loss, and also some challenges in things she would like to work on into the future.  Does feel more strength and has really good family support and contacts.  Noticing some progress within herself as she does work on her grief.  Dr.'s office did increase her antidepressant a bit and that has helped.  Goal review and  progress/challenges noted with patient.  Next appointment within 1 month.   Barnie Bunde, LCSW                   "

## 2024-09-11 ENCOUNTER — Ambulatory Visit: Admitting: Psychiatry

## 2024-09-18 ENCOUNTER — Encounter: Payer: Self-pay | Admitting: Family Medicine

## 2024-09-18 DIAGNOSIS — E119 Type 2 diabetes mellitus without complications: Secondary | ICD-10-CM

## 2024-09-19 ENCOUNTER — Other Ambulatory Visit (HOSPITAL_BASED_OUTPATIENT_CLINIC_OR_DEPARTMENT_OTHER): Payer: Self-pay

## 2024-09-19 ENCOUNTER — Other Ambulatory Visit: Payer: Self-pay

## 2024-09-19 MED ORDER — TIRZEPATIDE 2.5 MG/0.5ML ~~LOC~~ SOAJ
2.5000 mg | SUBCUTANEOUS | 0 refills | Status: AC
  Filled 2024-09-19: qty 2, 28d supply, fill #0

## 2024-09-22 ENCOUNTER — Other Ambulatory Visit (HOSPITAL_BASED_OUTPATIENT_CLINIC_OR_DEPARTMENT_OTHER): Payer: Self-pay

## 2024-09-26 ENCOUNTER — Ambulatory Visit: Admitting: Family Medicine

## 2024-09-27 ENCOUNTER — Ambulatory Visit: Admitting: Psychiatry

## 2024-10-02 ENCOUNTER — Ambulatory Visit: Admitting: Psychiatry

## 2025-07-23 ENCOUNTER — Ambulatory Visit
